# Patient Record
Sex: Female | Born: 1975
Health system: Southern US, Community
[De-identification: ages and names within clinical notes are randomized; demographics above are authoritative.]

## PROBLEM LIST (undated history)

## (undated) DIAGNOSIS — F329 Major depressive disorder, single episode, unspecified: Secondary | ICD-10-CM

## (undated) DIAGNOSIS — F419 Anxiety disorder, unspecified: Secondary | ICD-10-CM

## (undated) DIAGNOSIS — B977 Papillomavirus as the cause of diseases classified elsewhere: Secondary | ICD-10-CM

## (undated) DIAGNOSIS — C801 Malignant (primary) neoplasm, unspecified: Secondary | ICD-10-CM

## (undated) DIAGNOSIS — F32A Depression, unspecified: Secondary | ICD-10-CM

## (undated) DIAGNOSIS — N6320 Unspecified lump in the left breast, unspecified quadrant: Secondary | ICD-10-CM

## (undated) DIAGNOSIS — IMO0002 Reserved for concepts with insufficient information to code with codable children: Secondary | ICD-10-CM

## (undated) DIAGNOSIS — R87619 Unspecified abnormal cytological findings in specimens from cervix uteri: Secondary | ICD-10-CM

## (undated) HISTORY — DX: Malignant (primary) neoplasm, unspecified: C80.1

## (undated) HISTORY — PX: ABDOMINAL HYSTERECTOMY: SHX81

## (undated) HISTORY — PX: TUBAL LIGATION: SHX77

## (undated) HISTORY — DX: Papillomavirus as the cause of diseases classified elsewhere: B97.7

## (undated) HISTORY — DX: Reserved for concepts with insufficient information to code with codable children: IMO0002

## (undated) HISTORY — PX: BREAST ENHANCEMENT SURGERY: SHX7

## (undated) HISTORY — PX: BREAST SURGERY: SHX581

## (undated) HISTORY — PX: AUGMENTATION MAMMAPLASTY: SUR837

## (undated) HISTORY — DX: Anxiety disorder, unspecified: F41.9

## (undated) HISTORY — DX: Unspecified abnormal cytological findings in specimens from cervix uteri: R87.619

## (undated) HISTORY — PX: COLPOSCOPY: SHX161

## (undated) HISTORY — PX: BREAST EXCISIONAL BIOPSY: SUR124

---

## 2000-05-27 ENCOUNTER — Ambulatory Visit (HOSPITAL_COMMUNITY): Admission: RE | Admit: 2000-05-27 | Discharge: 2000-05-27 | Payer: Self-pay | Admitting: Family Medicine

## 2000-05-27 ENCOUNTER — Encounter: Payer: Self-pay | Admitting: Family Medicine

## 2011-04-26 ENCOUNTER — Other Ambulatory Visit (HOSPITAL_COMMUNITY)
Admission: RE | Admit: 2011-04-26 | Discharge: 2011-04-26 | Disposition: A | Discharge status: Home / Self Care | Payer: Self-pay | Source: Ambulatory Visit | Attending: Family Medicine | Admitting: Family Medicine

## 2011-04-26 ENCOUNTER — Other Ambulatory Visit: Payer: Self-pay | Admitting: Nurse Practitioner

## 2011-04-26 ENCOUNTER — Other Ambulatory Visit (HOSPITAL_COMMUNITY)
Admission: RE | Admit: 2011-04-26 | Discharge: 2011-04-26 | Disposition: A | Discharge status: Home / Self Care | Payer: Self-pay | Source: Ambulatory Visit | Attending: Unknown Physician Specialty | Admitting: Unknown Physician Specialty

## 2011-04-26 DIAGNOSIS — R87619 Unspecified abnormal cytological findings in specimens from cervix uteri: Secondary | ICD-10-CM | POA: Insufficient documentation

## 2011-04-26 DIAGNOSIS — Z01419 Encounter for gynecological examination (general) (routine) without abnormal findings: Secondary | ICD-10-CM | POA: Insufficient documentation

## 2011-05-05 ENCOUNTER — Other Ambulatory Visit (HOSPITAL_COMMUNITY): Payer: Self-pay | Admitting: Family Medicine

## 2011-05-05 DIAGNOSIS — N6452 Nipple discharge: Secondary | ICD-10-CM

## 2011-05-18 ENCOUNTER — Encounter (HOSPITAL_COMMUNITY): Payer: Self-pay

## 2011-05-25 ENCOUNTER — Inpatient Hospital Stay (HOSPITAL_COMMUNITY): Admission: RE | Admit: 2011-05-25 | Payer: Self-pay | Source: Ambulatory Visit

## 2012-09-18 ENCOUNTER — Other Ambulatory Visit (HOSPITAL_COMMUNITY)
Admission: RE | Admit: 2012-09-18 | Discharge: 2012-09-18 | Disposition: A | Discharge status: Home / Self Care | Payer: Managed Care, Other (non HMO) | Source: Ambulatory Visit | Attending: Unknown Physician Specialty | Admitting: Unknown Physician Specialty

## 2012-09-18 ENCOUNTER — Other Ambulatory Visit (HOSPITAL_COMMUNITY): Payer: Self-pay | Admitting: Nurse Practitioner

## 2012-09-18 DIAGNOSIS — R87810 Cervical high risk human papillomavirus (HPV) DNA test positive: Secondary | ICD-10-CM | POA: Insufficient documentation

## 2012-09-18 DIAGNOSIS — D069 Carcinoma in situ of cervix, unspecified: Secondary | ICD-10-CM | POA: Insufficient documentation

## 2012-09-18 DIAGNOSIS — N6452 Nipple discharge: Secondary | ICD-10-CM

## 2012-10-03 ENCOUNTER — Encounter (HOSPITAL_COMMUNITY): Payer: Self-pay

## 2013-02-12 ENCOUNTER — Encounter: Payer: Self-pay | Admitting: *Deleted

## 2013-02-13 ENCOUNTER — Encounter: Payer: Managed Care, Other (non HMO) | Admitting: Obstetrics and Gynecology

## 2013-05-27 ENCOUNTER — Encounter: Payer: Self-pay | Admitting: Obstetrics and Gynecology

## 2013-06-03 ENCOUNTER — Encounter (INDEPENDENT_AMBULATORY_CARE_PROVIDER_SITE_OTHER): Payer: Self-pay

## 2013-06-03 ENCOUNTER — Encounter: Payer: Self-pay | Admitting: Obstetrics and Gynecology

## 2013-06-03 ENCOUNTER — Ambulatory Visit (INDEPENDENT_AMBULATORY_CARE_PROVIDER_SITE_OTHER): Payer: Managed Care, Other (non HMO) | Admitting: Obstetrics and Gynecology

## 2013-06-03 VITALS — BP 118/64 | Ht 63.5 in | Wt 113.4 lb

## 2013-06-03 DIAGNOSIS — R87619 Unspecified abnormal cytological findings in specimens from cervix uteri: Secondary | ICD-10-CM | POA: Insufficient documentation

## 2013-06-03 NOTE — Progress Notes (Signed)
Will call [facility] and request records.Patient did not sign release. AHB

## 2013-06-03 NOTE — Progress Notes (Signed)
This chart was transcribed for Dr. Mallory Shirk by Ludger Nutting, ED scribe.   Dominique Taylor is an 38 y.o. female. Pt presents today for follow up on an abnormal pap smear and leep procedure. She reports having 2 prior colposcopies. Her records were reviewed but no prior notes were found. LNMP December 28th  CC: requesting leap procedure for abnormal pap smear.   Pertinent Gynecological History:  No records to confirm history at this time.   Menstrual History: N/A  Patient's last menstrual period was 05/21/2013.    Past Medical History  Diagnosis Date  . Abnormal Pap smear   . Cancer     ovarian cervical ca  . HPV (human papilloma virus) infection     Past Surgical History  Procedure Laterality Date  . Colposcopy    . Tubal ligation    . Breast enhancement surgery      Family History  Problem Relation Age of Onset  . Arthritis Mother   . Cancer Mother   . Depression Father   . Hypertension Father   . Crohn's disease Father     Social History:  reports that she has been smoking Cigarettes.  She has a 23 pack-year smoking history. She has never used smokeless tobacco. She reports that she does not drink alcohol or use illicit drugs.  Allergies: No Known Allergies   (Not in a hospital admission)  ROS  Blood pressure 118/64, height 5' 3.5" (1.613 m), weight 113 lb 6.4 oz (51.438 kg), last menstrual period 05/21/2013. Physical Exam  No results found for this or any previous visit (from the past 24 hour(s)).  No results found.  Assessment/Plan: Reschedule visit to allow time to request past records.   [name] V 06/03/2013, 3:21 PM

## 2013-06-17 ENCOUNTER — Encounter: Payer: Self-pay | Admitting: Obstetrics and Gynecology

## 2013-06-17 ENCOUNTER — Ambulatory Visit (INDEPENDENT_AMBULATORY_CARE_PROVIDER_SITE_OTHER): Payer: Managed Care, Other (non HMO) | Admitting: Obstetrics and Gynecology

## 2013-06-17 ENCOUNTER — Other Ambulatory Visit: Payer: Self-pay | Admitting: Obstetrics and Gynecology

## 2013-06-17 VITALS — BP 100/60 | Ht 63.0 in | Wt 113.2 lb

## 2013-06-17 DIAGNOSIS — Z3202 Encounter for pregnancy test, result negative: Secondary | ICD-10-CM

## 2013-06-17 DIAGNOSIS — N870 Mild cervical dysplasia: Secondary | ICD-10-CM

## 2013-06-17 LAB — POCT URINE PREGNANCY: Preg Test, Ur: NEGATIVE

## 2013-06-17 NOTE — Progress Notes (Deleted)
This chart was transcribed for Dr. Mallory Shirk by Ludger Nutting, ED scribe.   [facility] Visit  Patient name: Dominique Taylor MRN [id number]  Date of birth: Sep 23, 1975  CC & HPI:  Dominique Taylor is a 38 y.o. female presenting today for **  ROS:  ***  Pertinent History Reviewed:  Medical & Surgical Hx:  Reviewed: Significant for *** Medications: Reviewed & Updated - see associated section Social History: Reviewed -  reports that she has been smoking Cigarettes.  She has a 23 pack-year smoking history. She has never used smokeless tobacco.  Objective Findings:  Vitals: BP 100/60  Ht 5\' 3"  (1.6 m)  Wt 113 lb 3.2 oz (51.347 kg)  BMI 20.06 kg/m2  LMP 06/09/2013  Physical Examination: {female adult master:[id number]}   Assessment & Plan:   ***

## 2013-06-17 NOTE — Patient Instructions (Signed)
LEEP POST-PROCEDURE INSTRUCTIONS  1. You may take Ibuprofen, Aleve or Tylenol for pain if needed.  Cramping is normal.  2. You will have black and/or bloody discharge at first.  This will lighten and then turn clear before completely resolving.  This will take 2 to 3 weeks.  3. Put nothing in your vagina until the bleeding or discharge stops (usually 2 or3 days). Avoid sex until you are reexamined in 3 weeks.  4. You need to call if you have redness around the biopsy site, if there is any unusual draining, if the bleeding is heavy, or if you are concerned.  5. Shower or bathe as normal  6. We will call you within one week with results or we will discuss the results at your follow-up appointment if needed.  7. You will need to return for a follow-up Pap smear as directed by your physician.

## 2013-06-17 NOTE — Progress Notes (Signed)
This chart was transcribed for Dr. Mallory Shirk by Ludger Nutting, ED scribe.    CLINIC LEEP PROCEDURE NOTE  Pap smear and colposcopy reviewed.  referrred from health Dept Pap ASCUS + HPV Colpo Biopsy at the health dept that showed high grade abnormalities.  ECC negative  Risks, benefits, alternatives, and limitations of procedure explained to patient, including pain, bleeding, infection, failure to remove abnormal tissue and failure to cure dysplasia, need for repeat procedures, damage to pelvic organs, cervical incompetence.  Role of HPV,cervical dysplasia and need for close followup was empasized. Informed written consent was obtained. All questions were answered. Time out performed.  ??Procedure: The patient was placed in lithotomy position and the bivalved coated speculum was placed in the patient's vagina. A grounding pad placed on the patient. Lugol's solution was applied to the cervix and areas of decreased uptake were noted around the transformation zone.   Local anesthesia was administered via an intracervical block using 10cc of 2% Lidocaine with epinephrine. The suction was turned on and the Large 1X Fisher Cone Biopsy Excisor on 37 Watts of cutting current was used to excise the area of decreased uptake and excise the entire transformation zone. Excellent hemostasis was achieved using Monsel's solution ,which was then applied and the speculum was removed from the vagina. Specimens were sent to pathology.  ?The patient tolerated the procedure well. Post-operative instructions given to patient, including instruction to seek medical attention for persistent bright red bleeding, fever, abdominal/pelvic pain, dysuria, nausea or vomiting. She was also told about the possibility of having copious yellow to black tinged discharge for weeks. She was counseled to avoid anything in the vagina (sex/douching/tampons) for 3 weeks. She has a 4 week post-operative check to assess wound healing, review results  and discuss further management.

## 2013-06-24 ENCOUNTER — Encounter: Payer: Self-pay | Admitting: Obstetrics and Gynecology

## 2013-06-25 ENCOUNTER — Ambulatory Visit (INDEPENDENT_AMBULATORY_CARE_PROVIDER_SITE_OTHER): Payer: Managed Care, Other (non HMO) | Admitting: Obstetrics and Gynecology

## 2013-06-25 ENCOUNTER — Telehealth: Payer: Self-pay | Admitting: Obstetrics and Gynecology

## 2013-06-25 ENCOUNTER — Encounter: Payer: Self-pay | Admitting: Obstetrics and Gynecology

## 2013-06-25 VITALS — BP 100/58 | Ht 63.0 in | Wt 116.0 lb

## 2013-06-25 DIAGNOSIS — N898 Other specified noninflammatory disorders of vagina: Secondary | ICD-10-CM

## 2013-06-25 MED ORDER — METRONIDAZOLE 0.75 % VA GEL: 1.0000 | Freq: Every day | VAGINAL | Status: DC

## 2013-06-25 NOTE — Patient Instructions (Signed)
Cancel the appt for the 23rd, reschedule for 4 wks. Use metrogel 2times weekly.

## 2013-06-25 NOTE — Telephone Encounter (Signed)
C/o lower left sided pain starting yesterday, states has "black charcoal looking vaginal discharge." Pt told to be here at 1:30 today to see Dr. Glo Herring.

## 2013-06-25 NOTE — Progress Notes (Signed)
   [facility] Visit  Patient name: Dominique Taylor MRN [id number]  Date of birth: 1975-12-13  CC & HPI:  Dominique Taylor is a 38 y.o. female presenting today for heavy dischg after LEEP last week. + Malodor,, heavy d/c with the Monsels breaking down.  Path :  cin I clear margins.  ROS:  No fever,   Pertinent History Reviewed:  Medical & Surgical Hx:  Reviewed: Significant for  Medications: Reviewed & Updated - see associated section Social History: Reviewed -  reports that she has been smoking Cigarettes.  She has a 23 pack-year smoking history. She has never used smokeless tobacco.  Objective Findings:  Vitals: BP 100/58  Ht 5\' 3"  (1.6 m)  Wt 116 lb (52.617 kg)  BMI 20.55 kg/m2  LMP 06/09/2013  Physical Examination: Abdomen - soft, nontender, nondistended, no masses or organomegaly Pelvic - normal external genitalia, vulva, vagina, cervix, uterus and adnexa, VULVA: normal appearing vulva with no masses, tenderness or lesions, VAGINA: vaginal discharge - copious, grey and monsels removed from vag vault, CERVIX: erythematous healing s/p leep, path CIN I   Assessment & Plan:   Post-LEEP cervical irritation from monsels Plan: RX Metrogel

## 2013-07-08 ENCOUNTER — Ambulatory Visit: Payer: Managed Care, Other (non HMO) | Admitting: Obstetrics and Gynecology

## 2013-07-23 ENCOUNTER — Encounter: Payer: Self-pay | Admitting: Obstetrics and Gynecology

## 2013-07-23 ENCOUNTER — Ambulatory Visit (INDEPENDENT_AMBULATORY_CARE_PROVIDER_SITE_OTHER): Payer: Managed Care, Other (non HMO) | Admitting: Obstetrics and Gynecology

## 2013-07-23 ENCOUNTER — Encounter (INDEPENDENT_AMBULATORY_CARE_PROVIDER_SITE_OTHER): Payer: Self-pay

## 2013-07-23 VITALS — BP 102/60 | Ht 63.0 in | Wt 115.0 lb

## 2013-07-23 DIAGNOSIS — N870 Mild cervical dysplasia: Secondary | ICD-10-CM

## 2013-07-23 NOTE — Progress Notes (Signed)
Patient ID: Dominique Taylor, female   DOB: 1976/03/04, 38 y.o.   MRN: [id number]    Subjective:  Dominique Taylor is a 38 y.o. female who presents to the clinic 4 weeks status post LEEP in referral from health dept for high grade dysplasia on bx, after LSIL pap..   Path: LSIL with clear margins  Review of Systems Negative except n/a  She has been eating a regular diet without difficulty.   Bowel movements are normal. The patient is not having any pain.she is not yet sexually active  Objective:  BP 102/60  Ht 5\' 3"  (1.6 m)  Wt 115 lb (52.164 kg)  BMI 20.38 kg/m2  LMP 07/07/2013 General:Well developed, well nourished.  No acute distress. Abdomen: Bowel sounds normal, soft, non-tender. Pelvic Exam:    External Genitalia:  Normal.    Vagina: Normal    Bimanual: Normal    Cervix: everted in standard healing s/p leep with increased visibility of EC glandsl    Uterus: Normal    Adnexa: Normal  Incision(s):  Healed leep   Assessment:  Post-Op 4 weeks s/p leep : Path: CIN-I    Doing well postoperatively.   Plan:  1.Wound care discussed  followup paps at health dept in 6 months then annually x yrs. 2. .Continue any current medications. 3. Activity restrictions: none 4. return to work: now. 5. Follow up in 6 month at htealth dept.

## 2013-07-23 NOTE — Patient Instructions (Signed)
Cervical Dysplasia Cervical dysplasia is a condition in which a woman has abnormal changes in the cells of her cervix. The cervix is the opening to the uterus (womb). It is located between the vagina and the uterus. Cervical dysplasia may be the first sign of cervical cancer.  With early detection, treatment, and close follow-up care, nearly all cases of cervical dysplasia can be cured. If left untreated, dysplasia may become more severe.  CAUSES  Cervical dysplasia can be caused by a human papillomavirus (HPV) infection. RISK FACTORS   Having had a sexually transmitted disease, such as chlamydia or a human papillomavirus (HPV) infection.   Becoming sexually active before age 18.   Having had more than 1 sexual partner.   Not using protection during sexual intercourse, especially with new sexual partners.   Having had cancer of the vagina or vulva.   Having a sexual partner whose previous partner had cancer of the cervix or cervical dysplasia.   Having a sexual partner who has or has had cancer of the penis.   Having a weakened immune system (such as from having HIV or an organ transplant).   Being the daughter of a woman who took diethylstilbestrol(DES) during pregnancy.   Having a family history of cervical cancer.   Smoking. SIGNS AND SYMPTOMS  There are usually no symptoms. If there are symptoms, they may include:   Abnormal vaginal discharge.   Bleeding between periods or after intercourse.   Bleeding during menopause.   Pain during sexual intercourse (dyspareunia). DIAGNOSIS  A test called a Pap test may be done.During this test, cells are taken from the cervix and then looked at under a microscope. A test in which tissue is removed from the cervix (biopsy) may also be done if the Pap test is abnormal or if the cervix looks abnormal.  TREATMENT  Treatment varies based on the severity of the cervical dysplasia. Treatment may include:  Cryotherapy.  During cryotherapy, the abnormal cells are frozen with a steel-tip instrument.   A procedure to remove abnormal tissue from the cervix.  Surgery to remove abnormal tissue. This is usually done in serious cases of cervical dysplasia. Surgical options include:  A cone biopsy. This is a procedure in which the cervical canal and a portion of the center of the cervix are removed.   Hysterectomy. This is a surgery in which the uterus and cervix are removed. HOME CARE INSTRUCTIONS   Only take over-the-counter or prescription medicines for pain or discomfort as directed by your health care provider.   Do not use tampons, have sexual intercourse, or douche until your health care provider says it is OK.  Keep follow-up appointments as directed by your health care provider. Women who have been treated for cervical dysplasia should have regular pelvic exams and Pap tests. During the first year following treatment of cervical dysplasia, Pap tests should be done every 3 4 months. In the second year, they should be done every 6 months or as recommended by your health care provider.  To prevent the condition from developing again, practice safe sex. SEEK MEDICAL CARE IF:  You develop genital warts.  SEEK IMMEDIATE MEDICAL CARE IF:   Your menstrual period is heavier than normal.   You develop bright red bleeding, especially if you have blood clots.   You have a fever.   You have increasing cramps or pain not relieved with medicine.   You are lightheaded, unusually weak, or have fainting spells.   You have   abnormal vaginal discharge.   You have abdominal pain. Document Released: 05/02/2005 Document Revised: 01/02/2013 Document Reviewed: 12/26/2012 [facility] Patient Information 2014 [place].

## 2013-12-10 ENCOUNTER — Telehealth: Payer: Self-pay | Admitting: *Deleted

## 2013-12-10 NOTE — Telephone Encounter (Signed)
Message copied by Doyne Keel on Tue Dec 10, 2013  9:09 AM ------      Message from: Dominique Taylor      Created: Sat Dec 07, 2013  8:32 PM       Patient needs followup pap in September, either at our office, or health Dept.      plEASE CALL PT TO ARRANGE ------

## 2013-12-23 NOTE — Telephone Encounter (Signed)
Unable to reach pt by phone, recall in Epic for pt to f/u in September for repeat pap per Dr. Glo Herring.

## 2014-06-13 ENCOUNTER — Ambulatory Visit (INDEPENDENT_AMBULATORY_CARE_PROVIDER_SITE_OTHER): Payer: Managed Care, Other (non HMO) | Admitting: Nurse Practitioner

## 2014-06-13 ENCOUNTER — Encounter: Payer: Self-pay | Admitting: Nurse Practitioner

## 2014-06-13 VITALS — BP 100/66 | HR 89 | Temp 98.0°F | Ht 63.5 in | Wt 119.0 lb

## 2014-06-13 DIAGNOSIS — J069 Acute upper respiratory infection, unspecified: Secondary | ICD-10-CM | POA: Insufficient documentation

## 2014-06-13 DIAGNOSIS — Z720 Tobacco use: Secondary | ICD-10-CM

## 2014-06-13 DIAGNOSIS — R590 Localized enlarged lymph nodes: Secondary | ICD-10-CM | POA: Insufficient documentation

## 2014-06-13 DIAGNOSIS — F17200 Nicotine dependence, unspecified, uncomplicated: Secondary | ICD-10-CM

## 2014-06-13 MED ORDER — GUAIFENESIN ER 600 MG PO TB12: 600.0000 mg | ORAL_TABLET | Freq: Two times a day (BID) | ORAL | Status: DC

## 2014-06-13 MED ORDER — HYDROCODONE-HOMATROPINE 5-1.5 MG/5ML PO SYRP: 5.0000 mL | ORAL_SOLUTION | Freq: Every evening | ORAL | Status: DC | PRN

## 2014-06-13 NOTE — Progress Notes (Signed)
Subjective:     Dominique Taylor is a 39 y.o. female and is here to establish care.The patient reports problems - cough, nasal congestion, sore throat for 4-5 days. She denies CP, SOB, diarrhea, nausea, ear pain. She is not taking anything for symptoms. Complicating factors: smoker. She also c/o swollen lymph node R side of neck times 1 year. NT. Gets bigger when she is sick. Hx CIN I. Fam Hx: mother RA, father UC.   History   Social History  . Marital Status: Married    Spouse Name: N/A    Number of Children: 2  . Years of Education: N/A   Occupational History  . Not on file.   Social History Main Topics  . Smoking status: Current Every Day Smoker -- 1.00 packs/day for 23 years    Types: Cigarettes  . Smokeless tobacco: Never Used  . Alcohol Use: No  . Drug Use: No  . Sexual Activity: Yes   Other Topics Concern  . Not on file   Social History Narrative   [name] lives w/her husband & 2 grown children. She works Medical laboratory scientific officer.   Health Maintenance  Topic Date Due  . INFLUENZA VACCINE  01/13/2016 (Originally 12/14/2013)  . PAP SMEAR  09/19/2015  . TETANUS/TDAP  06/13/2018    The following portions of the patient's history were reviewed and updated as appropriate: allergies, current medications, past family history, past medical history, past social history, past surgical history and problem list.  Review of Systems Constitutional: positive for chills Respiratory: positive for sputum, negative for pleurisy/chest pain and wheezing Cardiovascular: negative for palpitations Genitourinary:negative for abnormal menstrual periods and states cycles seem to be getting shorter   Objective:    BP 100/66 mmHg  Pulse 89  Temp(Src) 98 F (36.7 C) (Oral)  Ht 5' 3.5" (1.613 m)  Wt 119 lb (53.978 kg)  BMI 20.75 kg/m2  SpO2 96%  LMP 05/27/2014 General appearance: alert, cooperative, appears stated age and no distress Head: Normocephalic, without obvious abnormality, atraumatic Eyes:  negative findings: lids and lashes normal and conjunctivae and sclerae normal Ears: normal TM's and external ear canals both ears Throat: lips, mucosa, and tongue normal; teeth and gums normal  Nasal quality to voice, sniffles during exam Neck: no carotid bruit, supple, symmetrical, trachea midline, thyroid not enlarged, symmetric, no tenderness/mass/nodules and palpable anterior cervical nodes, bilat. Has prominent R anterior cervical nod, ovoid, about 1/5 cm, moveable, NT. Heart: RRR, no murmur Lungs: clear to auscultation bilaterally and cough periodically during exam    Assessment:Plan  1. Upper respiratory infection with cough and congestion Symptom management - guaiFENesin (MUCINEX) 600 MG 12 hr tablet; Take 1 tablet (600 mg total) by mouth 2 (two) times daily.  Dispense: 15 tablet; Refill: 0 - HYDROcodone-homatropine (HYCODAN) 5-1.5 MG/5ML syrup; Take 5 mLs by mouth at bedtime as needed for cough.  Dispense: 120 mL; Refill: 0  2. Smoker  3. Lymphadenopathy of right cervical region F/u 3 weeks-baseline labs, possibly neck US & CXR

## 2014-06-13 NOTE — Patient Instructions (Signed)
You have a virus causing your symptoms. The average duration of cold symptoms is 14 days.  For nasal congestion: Start daily sinus rinses (neilmed Sinus Rinse). Use 30 mg to 60 mg pseudoephedrine 2 to 3 times daily. Vicks vapor rub under nose to help breathe. For cough: Use cough syrup at night. Use guaifenesen 600 mg twice daily. Sip fluids hourly so medicine will work best.  Tylenol or ibuprophen for headache. Sip fluids every hour. Rest. I will see you in 3 weeks to evaluate lymph node or sooner if you develop fever or chest pain. Very nice to meet you!  Upper Respiratory Infection, Adult An upper respiratory infection (URI) is also sometimes known as the common cold. The upper respiratory tract includes the nose, sinuses, throat, trachea, and bronchi. Bronchi are the airways leading to the lungs. Most people improve within 1 week, but symptoms can last up to 2 weeks. A residual cough may last even longer.  CAUSES Many different viruses can infect the tissues lining the upper respiratory tract. The tissues become irritated and inflamed and often become very moist. Mucus production is also common. A cold is contagious. You can easily spread the virus to others by oral contact. This includes kissing, sharing a glass, coughing, or sneezing. Touching your mouth or nose and then touching a surface, which is then touched by another person, can also spread the virus. SYMPTOMS  Symptoms typically develop 1 to 3 days after you come in contact with a cold virus. Symptoms vary from person to person. They may include:  Runny nose.  Sneezing.  Nasal congestion.  Sinus irritation.  Sore throat.  Loss of voice (laryngitis).  Cough.  Fatigue.  Muscle aches.  Loss of appetite.  Headache.  Low-grade fever. DIAGNOSIS  You might diagnose your own cold based on familiar symptoms, since most people get a cold 2 to 3 times a year. Your caregiver can confirm this based on your exam. Most  importantly, your caregiver can check that your symptoms are not due to another disease such as strep throat, sinusitis, pneumonia, asthma, or epiglottitis. Blood tests, throat tests, and X-rays are not necessary to diagnose a common cold, but they may sometimes be helpful in excluding other more serious diseases. Your caregiver will decide if any further tests are required. RISKS AND COMPLICATIONS  You may be at risk for a more severe case of the common cold if you smoke cigarettes, have chronic heart disease (such as heart failure) or lung disease (such as asthma), or if you have a weakened immune system. The very young and very old are also at risk for more serious infections. Bacterial sinusitis, middle ear infections, and bacterial pneumonia can complicate the common cold. The common cold can worsen asthma and chronic obstructive pulmonary disease (COPD). Sometimes, these complications can require emergency medical care and may be life-threatening. PREVENTION  The best way to protect against getting a cold is to practice good hygiene. Avoid oral or hand contact with people with cold symptoms. Wash your hands often if contact occurs. There is no clear evidence that vitamin C, vitamin E, echinacea, or exercise reduces the chance of developing a cold. However, it is always recommended to get plenty of rest and practice good nutrition. TREATMENT  Treatment is directed at relieving symptoms. There is no cure. Antibiotics are not effective, because the infection is caused by a virus, not by bacteria. Treatment may include:  Increased fluid intake. Sports drinks offer valuable electrolytes, sugars, and fluids.  Breathing heated mist or steam (vaporizer or shower).  Eating chicken soup or other clear broths, and maintaining good nutrition.  Getting plenty of rest.  Using gargles or lozenges for comfort.  Controlling fevers with ibuprofen or acetaminophen as directed by your caregiver.  Increasing  usage of your inhaler if you have asthma. Zinc gel and zinc lozenges, taken in the first 24 hours of the common cold, can shorten the duration and lessen the severity of symptoms. Pain medicines may help with fever, muscle aches, and throat pain. A variety of non-prescription medicines are available to treat congestion and runny nose. Your caregiver can make recommendations and may suggest nasal or lung inhalers for other symptoms.  HOME CARE INSTRUCTIONS   Only take over-the-counter or prescription medicines for pain, discomfort, or fever as directed by your caregiver.  Use a warm mist humidifier or inhale steam from a shower to increase air moisture. This may keep secretions moist and make it easier to breathe.  Drink enough water and fluids to keep your urine clear or pale yellow.  Rest as needed.  Return to work when your temperature has returned to normal or as your caregiver advises. You may need to stay home longer to avoid infecting others. You can also use a face mask and careful hand washing to prevent spread of the virus. SEEK MEDICAL CARE IF:   After the first few days, you feel you are getting worse rather than better.  You need your caregiver's advice about medicines to control symptoms.  You develop chills, worsening shortness of breath, or brown or red sputum. These may be signs of pneumonia.  You develop yellow or brown nasal discharge or pain in the face, especially when you bend forward. These may be signs of sinusitis.  You develop a fever, swollen neck glands, pain with swallowing, or white areas in the back of your throat. These may be signs of strep throat. SEEK IMMEDIATE MEDICAL CARE IF:   You have a fever.  You develop severe or persistent headache, ear pain, sinus pain, or chest pain.  You develop wheezing, a prolonged cough, cough up blood, or have a change in your usual mucus (if you have chronic lung disease).  You develop sore muscles or a stiff  neck. Document Released: 10/26/2000 Document Revised: 07/25/2011 Document Reviewed: 09/03/2010 [facility] Patient Information 2014 [place], [place].

## 2014-06-13 NOTE — Progress Notes (Signed)
Pre visit review using our clinic review tool, if applicable. No additional management support is needed unless otherwise documented below in the visit note. 

## 2014-06-16 ENCOUNTER — Telehealth: Payer: Self-pay | Admitting: Nurse Practitioner

## 2014-06-16 NOTE — Telephone Encounter (Signed)
emmi mailed  °

## 2014-06-29 ENCOUNTER — Emergency Department (HOSPITAL_COMMUNITY): Payer: Managed Care, Other (non HMO)

## 2014-06-29 ENCOUNTER — Encounter (HOSPITAL_COMMUNITY): Payer: Self-pay | Admitting: Emergency Medicine

## 2014-06-29 ENCOUNTER — Emergency Department (HOSPITAL_COMMUNITY)
Admission: EM | Admit: 2014-06-29 | Discharge: 2014-06-29 | Disposition: A | Discharge status: Home / Self Care | Payer: Managed Care, Other (non HMO) | Attending: Emergency Medicine | Admitting: Emergency Medicine

## 2014-06-29 DIAGNOSIS — R319 Hematuria, unspecified: Secondary | ICD-10-CM | POA: Insufficient documentation

## 2014-06-29 DIAGNOSIS — Z3202 Encounter for pregnancy test, result negative: Secondary | ICD-10-CM | POA: Diagnosis not present

## 2014-06-29 DIAGNOSIS — Z8619 Personal history of other infectious and parasitic diseases: Secondary | ICD-10-CM | POA: Diagnosis not present

## 2014-06-29 DIAGNOSIS — Z720 Tobacco use: Secondary | ICD-10-CM | POA: Diagnosis not present

## 2014-06-29 DIAGNOSIS — Z8543 Personal history of malignant neoplasm of ovary: Secondary | ICD-10-CM | POA: Insufficient documentation

## 2014-06-29 DIAGNOSIS — Z79899 Other long term (current) drug therapy: Secondary | ICD-10-CM | POA: Insufficient documentation

## 2014-06-29 DIAGNOSIS — F17200 Nicotine dependence, unspecified, uncomplicated: Secondary | ICD-10-CM

## 2014-06-29 DIAGNOSIS — J069 Acute upper respiratory infection, unspecified: Secondary | ICD-10-CM | POA: Insufficient documentation

## 2014-06-29 DIAGNOSIS — R05 Cough: Secondary | ICD-10-CM | POA: Diagnosis present

## 2014-06-29 LAB — URINALYSIS, ROUTINE W REFLEX MICROSCOPIC
GLUCOSE, UA: NEGATIVE mg/dL
Nitrite: NEGATIVE
PH: 6 (ref 5.0–8.0)
Protein, ur: NEGATIVE mg/dL
Specific Gravity, Urine: 1.021 (ref 1.005–1.030)
Urobilinogen, UA: 1 mg/dL (ref 0.0–1.0)

## 2014-06-29 LAB — COMPREHENSIVE METABOLIC PANEL
ALT: 21 U/L (ref 0–35)
AST: 21 U/L (ref 0–37)
Albumin: 4 g/dL (ref 3.5–5.2)
Alkaline Phosphatase: 71 U/L (ref 39–117)
Anion gap: 9 (ref 5–15)
BUN: 6 mg/dL (ref 6–23)
CALCIUM: 9.1 mg/dL (ref 8.4–10.5)
CO2: 25 mmol/L (ref 19–32)
CREATININE: 0.58 mg/dL (ref 0.50–1.10)
Chloride: 102 mmol/L (ref 96–112)
GFR calc Af Amer: >90 mL/min (ref >90)
Glucose, Bld: 113 mg/dL — ABNORMAL HIGH (ref 70–99)
Potassium: 3.7 mmol/L (ref 3.5–5.1)
Sodium: 136 mmol/L (ref 135–145)
Total Bilirubin: 0.7 mg/dL (ref 0.3–1.2)
Total Protein: 7.2 g/dL (ref 6.0–8.3)

## 2014-06-29 LAB — CBC WITH DIFFERENTIAL/PLATELET
Basophils Absolute: 0.1 10*3/uL (ref 0.0–0.1)
Basophils Relative: 1 % (ref 0–1)
EOS ABS: 0.2 10*3/uL (ref 0.0–0.7)
EOS PCT: 2 % (ref 0–5)
HCT: 40.4 % (ref 36.0–46.0)
Hemoglobin: 14.1 g/dL (ref 12.0–15.0)
LYMPHS ABS: 2.2 10*3/uL (ref 0.7–4.0)
Lymphocytes Relative: 25 % (ref 12–46)
MCH: 34.4 pg — ABNORMAL HIGH (ref 26.0–34.0)
MCHC: 34.9 g/dL (ref 30.0–36.0)
MCV: 98.5 fL (ref 78.0–100.0)
MONO ABS: 0.7 10*3/uL (ref 0.1–1.0)
Monocytes Relative: 8 % (ref 3–12)
NEUTROS PCT: 64 % (ref 43–77)
Neutro Abs: 5.7 10*3/uL (ref 1.7–7.7)
Platelets: 252 10*3/uL (ref 150–400)
RBC: 4.1 MIL/uL (ref 3.87–5.11)
RDW: 12.4 % (ref 11.5–15.5)
WBC: 8.8 10*3/uL (ref 4.0–10.5)

## 2014-06-29 LAB — URINE MICROSCOPIC-ADD ON

## 2014-06-29 LAB — PREGNANCY, URINE: Preg Test, Ur: NEGATIVE

## 2014-06-29 MED ORDER — BENZONATATE 100 MG PO CAPS: 100.0000 mg | ORAL_CAPSULE | Freq: Two times a day (BID) | ORAL | Status: DC | PRN

## 2014-06-29 MED ORDER — DEXAMETHASONE SODIUM PHOSPHATE 10 MG/ML IJ SOLN
10.0000 mg | Freq: Once | INTRAMUSCULAR | Status: AC
  Administered 2014-06-29: 10 mg via INTRAMUSCULAR
  Filled 2014-06-29: qty 1

## 2014-06-29 MED ORDER — AZITHROMYCIN 250 MG PO TABS: 250.0000 mg | ORAL_TABLET | Freq: Every day | ORAL | Status: DC

## 2014-06-29 NOTE — ED Notes (Signed)
Pt a/o x 4 on d/c with steady gait. 

## 2014-06-29 NOTE — ED Notes (Signed)
Cough for 3 weeks with chills and  Sweating.  She vomited this am  lmp jan  2oth.  Current smoker.  Productive cough

## 2014-06-29 NOTE — ED Notes (Signed)
Pt to xray for chest

## 2014-06-29 NOTE — ED Provider Notes (Signed)
CSN: [id number]     Arrival date & time 06/29/14  1818 History   First MD Initiated Contact with Patient 06/29/14 1911     Chief Complaint  Patient presents with  . Cough  . Fever  . Chills  . Emesis     (Consider location/radiation/quality/duration/timing/severity/associated sxs/prior Treatment) Patient is a 39 y.o. female presenting with URI. The history is provided by the patient.  URI Presenting symptoms: congestion and cough   Presenting symptoms: no fever, no rhinorrhea and no sore throat   Severity:  Moderate Onset quality:  Gradual Duration:  4 weeks Timing:  Constant Progression:  Worsening Chronicity:  New Relieved by:  None tried Worsened by:  Nothing tried Ineffective treatments:  None tried Associated symptoms: no headaches, no neck pain, no swollen glands and no wheezing   Risk factors: no sick contacts   Risk factors comment:  Heavy tobacco use   Past Medical History  Diagnosis Date  . Abnormal Pap smear   . Cancer     ovarian cervical ca  . HPV (human papilloma virus) infection    Past Surgical History  Procedure Laterality Date  . Colposcopy    . Tubal ligation    . Breast enhancement surgery     Family History  Problem Relation Age of Onset  . Arthritis Mother     RA  . Cancer Mother     Ovarian  . Depression Father   . Hypertension Father   . Diabetes Father   . Ulcerative colitis Father   . Hypertension Brother    History  Substance Use Topics  . Smoking status: Current Every Day Smoker -- 1.00 packs/day for 23 years    Types: Cigarettes  . Smokeless tobacco: Never Used  . Alcohol Use: No   OB History    No data available     Review of Systems  Constitutional: Negative for fever, chills and diaphoresis.  HENT: Positive for congestion. Negative for rhinorrhea and sore throat.   Eyes: Negative for visual disturbance.  Respiratory: Positive for cough. Negative for shortness of breath and wheezing.   Cardiovascular: Negative for  chest pain and leg swelling.  Gastrointestinal: Positive for vomiting (post-tussive). Negative for nausea and diarrhea.  Genitourinary: Negative for dysuria and flank pain.  Musculoskeletal: Negative for neck pain.  Skin: Negative for rash.  Neurological: Negative for dizziness, syncope, weakness, light-headedness and headaches.      Allergies  Review of patient's allergies indicates no known allergies.  Home Medications   Prior to Admission medications   Medication Sig Start Date End Date Taking? Authorizing Provider  Prenatal Vit-Fe Fumarate-FA (PRENATAL MULTIVITAMIN) TABS tablet Take 1 tablet by mouth daily at 12 noon.   Yes Historical Provider, MD  guaiFENesin (MUCINEX) 600 MG 12 hr tablet Take 1 tablet (600 mg total) by mouth 2 (two) times daily. Patient not taking: Reported on 06/29/2014 06/13/14   Irene Pap, NP  HYDROcodone-homatropine (HYCODAN) 5-1.5 MG/5ML syrup Take 5 mLs by mouth at bedtime as needed for cough. Patient not taking: Reported on 06/29/2014 06/13/14   Irene Pap, NP   BP 94/71 mmHg  Pulse 96  Temp(Src) 97.9 F (36.6 C) (Oral)  Resp 20  Ht 5\' 3"  (1.6 m)  Wt 119 lb (53.978 kg)  BMI 21.09 kg/m2  SpO2 99%  LMP 06/04/2014 Physical Exam  Constitutional: She is oriented to person, place, and time. She appears well-developed and well-nourished. No distress.  HENT:  Head: Normocephalic and atraumatic.  Eyes:  Conjunctivae are normal. Pupils are equal, round, and reactive to light.  Neck: Neck supple.  Cardiovascular: Normal rate and normal heart sounds.  Exam reveals no friction rub.   No murmur heard. Pulmonary/Chest: Effort normal. No respiratory distress. She has decreased breath sounds. She has no wheezes. She has no rales.  Abdominal: Soft. She exhibits no distension. There is no tenderness.  Musculoskeletal: She exhibits no tenderness.  Neurological: She is alert and oriented to person, place, and time.  Skin: No rash noted.  Nursing note and  vitals reviewed.   ED Course  Procedures (including critical care time) Labs Review Labs Reviewed  CBC WITH DIFFERENTIAL/PLATELET - Abnormal; Notable for the following:    MCH 34.4 (*)    All other components within normal limits  COMPREHENSIVE METABOLIC PANEL - Abnormal; Notable for the following:    Glucose, Bld 113 (*)    All other components within normal limits  URINALYSIS, ROUTINE W REFLEX MICROSCOPIC - Abnormal; Notable for the following:    Color, Urine AMBER (*)    APPearance CLOUDY (*)    Hgb urine dipstick MODERATE (*)    Bilirubin Urine SMALL (*)    Ketones, ur >80 (*)    Leukocytes, UA TRACE (*)    All other components within normal limits  URINE MICROSCOPIC-ADD ON - Abnormal; Notable for the following:    Squamous Epithelial / LPF MANY (*)    Bacteria, UA FEW (*)    All other components within normal limits  PREGNANCY, URINE    Imaging Review Dg Chest 2 View  06/29/2014   CLINICAL DATA:  Four-week history of cough  EXAM: CHEST  2 VIEW  COMPARISON:  None.  FINDINGS: Lungs are clear. Heart size and pulmonary vascularity are normal. No adenopathy. There is lower thoracic levoscoliosis.  IMPRESSION: No edema or consolidation.   Electronically Signed   By: Lowella Grip III M.D.   On: 06/29/2014 19:06     EKG Interpretation None      MDM   Final diagnoses:  Upper respiratory infection with cough and congestion  Smoker  Hematuria    39 yo F with PMHx of heavy tobacco abuse who p/w a 3-4 week h/o nagging, productive cough with mild post-tussive nausea and one episode of NBNB emesis. See HPI above. On arrival, T 9F, HR 89, RR 17, BP 100/66, satting 96% on RA. Exam as above, pt overall well-appearing and in NAD.  Pt's presentation is most c/w likely viral URI or bronchitis, with possible component of COPD/obstructive lung disease given known heavy h/o tobacco use. Will send for CXR to eval underlying PNA. No h/o CHF. Pt has no leg swelling, tachypnea,  tachycardia, or s/s DVT or PE and is PERC negative - do not suspect acute PE. Labs sent in triage unremarkable - with normal CBC without leukocytosis and unremarkable CMP. UA with 11-20 RBCs and 3-6 WBCs. Possibly contaminant, but pt states she has had hematuria in the past - given her smoking history, discussed need for PCP f/u for this. She is in agreement. No dysuria, frequency, or urinary s/s and no history to suggest stone. Will f/u CXR and re-assess.  CXR clear. Will treat as viral bronchitis with component of RAD. Will give decadron, Z-Pack, and tessalon pearls. Pt has PCP f/u in several days. Return precautions discussed.  Clinical Impression: 1. Upper respiratory infection with cough and congestion   2. Smoker   3. Hematuria     Disposition: Discharge  Condition: Good  I have discussed the results, Dx and Tx plan with the pt(& family if present). He/she/they expressed understanding and agree(s) with the plan. Discharge instructions discussed at great length. Strict return precautions discussed and pt &/or family have verbalized understanding of the instructions. No further questions at time of discharge.    Discharge Medication List as of 06/29/2014  8:27 PM    START taking these medications   Details  azithromycin (ZITHROMAX) 250 MG tablet Take 1 tablet (250 mg total) by mouth daily. Take first 2 tablets together, then 1 every day until finished., Starting 06/29/2014, Until Discontinued, Print    benzonatate (TESSALON) 100 MG capsule Take 1 capsule (100 mg total) by mouth 2 (two) times daily as needed for cough., Starting 06/29/2014, Until Discontinued, Print        Follow Up: Irene Pap, NP [id number] [place] [address] [phone]   As scheduled  [place] [phone]  Call to discuss the blood in your urine   Pt seen in conjunction with Dr. Marlynn Perking,  MD 06/30/14 0139  Dot Lanes, MD 07/01/14 1256

## 2014-06-29 NOTE — ED Notes (Signed)
Pt c/o body aches, fever, chills and vomiting ongoing x 4 weeks. Pt seen PMD at beginning of symptoms and has follow up appointment Tuesday.

## 2014-07-01 ENCOUNTER — Ambulatory Visit (INDEPENDENT_AMBULATORY_CARE_PROVIDER_SITE_OTHER): Payer: Managed Care, Other (non HMO) | Admitting: Nurse Practitioner

## 2014-07-01 ENCOUNTER — Encounter: Payer: Self-pay | Admitting: Nurse Practitioner

## 2014-07-01 VITALS — BP 94/60 | HR 77 | Temp 98.1°F | Ht 63.5 in | Wt 114.0 lb

## 2014-07-01 DIAGNOSIS — Z0000 Encounter for general adult medical examination without abnormal findings: Secondary | ICD-10-CM | POA: Insufficient documentation

## 2014-07-01 DIAGNOSIS — J189 Pneumonia, unspecified organism: Secondary | ICD-10-CM

## 2014-07-01 DIAGNOSIS — R319 Hematuria, unspecified: Secondary | ICD-10-CM

## 2014-07-01 DIAGNOSIS — R590 Localized enlarged lymph nodes: Secondary | ICD-10-CM

## 2014-07-01 DIAGNOSIS — J069 Acute upper respiratory infection, unspecified: Secondary | ICD-10-CM

## 2014-07-01 DIAGNOSIS — F17200 Nicotine dependence, unspecified, uncomplicated: Secondary | ICD-10-CM

## 2014-07-01 DIAGNOSIS — R599 Enlarged lymph nodes, unspecified: Secondary | ICD-10-CM

## 2014-07-01 DIAGNOSIS — Z720 Tobacco use: Secondary | ICD-10-CM

## 2014-07-01 LAB — VITAMIN D 25 HYDROXY (VIT D DEFICIENCY, FRACTURES): VITD: 20.86 ng/mL — ABNORMAL LOW (ref 30.00–100.00)

## 2014-07-01 LAB — LIPID PANEL
Cholesterol: 143 mg/dL (ref 0–200)
HDL: 41.8 mg/dL (ref >39.00)
LDL Cholesterol: 76 mg/dL (ref 0–99)
NonHDL: 101.2
Total CHOL/HDL Ratio: 3
Triglycerides: 126 mg/dL (ref 0.0–149.0)
VLDL: 25.2 mg/dL (ref 0.0–40.0)

## 2014-07-01 LAB — URINALYSIS, ROUTINE W REFLEX MICROSCOPIC
Hgb urine dipstick: NEGATIVE
LEUKOCYTES UA: NEGATIVE
NITRITE: NEGATIVE
PH: 6 (ref 5.0–8.0)
Specific Gravity, Urine: >=1.03 — AB (ref 1.000–1.030)
Total Protein, Urine: NEGATIVE
Urine Glucose: NEGATIVE
Urobilinogen, UA: 1 (ref 0.0–1.0)

## 2014-07-01 LAB — T4, FREE: Free T4: 1.21 ng/dL (ref 0.60–1.60)

## 2014-07-01 LAB — VITAMIN B12: Vitamin B-12: 504 pg/mL (ref 211–911)

## 2014-07-01 LAB — HEMOGLOBIN A1C: HEMOGLOBIN A1C: 5.4 % (ref 4.6–6.5)

## 2014-07-01 LAB — TSH: TSH: 1.41 u[IU]/mL (ref 0.35–4.50)

## 2014-07-01 MED ORDER — HYDROCODONE-HOMATROPINE 5-1.5 MG/5ML PO SYRP: 5.0000 mL | ORAL_SOLUTION | Freq: Every evening | ORAL | Status: DC | PRN

## 2014-07-01 MED ORDER — PREDNISONE 10 MG PO TABS: ORAL_TABLET | ORAL | Status: DC

## 2014-07-01 MED ORDER — ALBUTEROL SULFATE HFA 108 (90 BASE) MCG/ACT IN AERS: 2.0000 | INHALATION_SPRAY | Freq: Four times a day (QID) | RESPIRATORY_TRACT | Status: DC | PRN

## 2014-07-01 MED ORDER — BENZONATATE 200 MG PO CAPS: 200.0000 mg | ORAL_CAPSULE | Freq: Three times a day (TID) | ORAL | Status: DC | PRN

## 2014-07-01 NOTE — Progress Notes (Signed)
Pre visit review using our clinic review tool, if applicable. No additional management support is needed unless otherwise documented below in the visit note. 

## 2014-07-01 NOTE — Progress Notes (Signed)
Subjective:     Dominique Taylor is a 39 y.o. female presents for f/u cough, swollen lymph node, & UC visit for cough-hematuria incidentally discovered. She has been coughing for 2 1/2 weeks. I saw her in ofc a few days after symptoms started-she also had sore throat & nasal congestion. I treated w/mucinex & cough syrup. She went to UC 2 days ago as cough was not better & was associated w/green sputum. Reviewed note: CXR was clear, but she was started on azithromycin. Cough is persistent w/ "coughing fits" that cause vomiting & SOB. Cough is keeping her awake. She denies wheezing, fever. She was found to have hematuria without infection at [facility]. She denies dysuria, frequency, hesitancy, abd pain, flank pain. Of concern, she is a smoker. Also of concern, she has had hard swollen R anterior cervical lymph node for about 1 year. She has not noticed change in size. She is a smoker, there is concern for head & neck cancer. Node has not changed since I examined her 3 weeks ago. i will screen for thyroid disease also. I have never seen her for CPE, she is fasting today, so I will check lipids & screen for vitamin deficiencies associated w/smokers.  The following portions of the patient's history were reviewed and updated as appropriate: allergies, current medications, past medical history, past social history, past surgical history and problem list.  Review of Systems Constitutional: negative for chills and weight loss Ears, nose, mouth, throat, and face: negative Respiratory: negative for pleurisy/chest pain and wheezing Cardiovascular: negative for palpitations Gastrointestinal: negative for abdominal pain, change in bowel habits, constipation and diarrhea Genitourinary:negative for abnormal menstrual periods Behavioral/Psych: positive for anxiety and pt states she smokes to "calm nerves" Endocrine: negative for temperature intolerance    Objective:    BP 94/60 mmHg  Pulse 77  Temp(Src) 98.1 F  (36.7 C) (Oral)  Ht 5' 3.5" (1.613 m)  Wt 114 lb (51.71 kg)  BMI 19.87 kg/m2  SpO2 96%  LMP 06/04/2014 BP 94/60 mmHg  Pulse 77  Temp(Src) 98.1 F (36.7 C) (Oral)  Ht 5' 3.5" (1.613 m)  Wt 114 lb (51.71 kg)  BMI 19.87 kg/m2  SpO2 96%  LMP 06/04/2014 General appearance: alert, cooperative, appears stated age and no distress Head: Normocephalic, without obvious abnormality, atraumatic Eyes: negative findings: lids and lashes normal and conjunctivae and sclerae normal Ears: normal TM's and external ear canals both ears Throat: lips, mucosa, and tongue normal; teeth and gums normal Neck: supple, symmetrical, trachea midline, thyroid not enlarged, symmetric, no tenderness/mass/nodules and rubbery fixed bilateral anterior cervical LAD, R larger than L.NT. Lungs: clear to auscultation bilaterally Heart: regular rate and rhythm, S1, S2 normal, no murmur, click, rub or gallop    Assessment:Plan  1. Pneumonitis Likely post-viral - benzonatate (TESSALON) 200 MG capsule; Take 1 capsule (200 mg total) by mouth 3 (three) times daily as needed for cough.  Dispense: 40 capsule; Refill: 0 - albuterol (PROVENTIL HFA;VENTOLIN HFA) 108 (90 BASE) MCG/ACT inhaler; Inhale 2 puffs into the lungs every 6 (six) hours as needed for wheezing.  Dispense: 1 Inhaler; Refill: 1 - predniSONE (DELTASONE) 10 MG tablet; Take then 3T po qam X 3d, then 2T po qd X 3d, then 1T po qam X 3d.  Dispense: 18 tablet; Refill: 0  2. Upper respiratory infection with cough and congestion - HYDROcodone-homatropine (HYCODAN) 5-1.5 MG/5ML syrup; Take 5 mLs by mouth at bedtime as needed for cough.  Dispense: 120 mL; Refill: 0  3. Hematuria  DD: UTI, kidney stone, bladder ca Risk factor: smoker - Urinalysis, Routine w reflex microscopic - Urine culture  4. Lymphadenopathy, anterior cervical DD: reactive, ca RF: smoker - T4, free - TSH - Thyroid peroxidase antibody - CT Soft Tissue Neck W Contrast; Future  5. Preventative  health care - Lipid panel - Vitamin B12 - Vit D  25 hydroxy (rtn osteoporosis monitoring) - Hemoglobin A1c  6. Smoker - Vitamin B12 - Vit D  25 hydroxy (rtn osteoporosis monitoring) - Urinalysis, Routine w reflex microscopic - CT Soft Tissue Neck W Contrast; Future    F/u 3 weeks-cough Spent 25 Minutes with patient, 50% or more was spent in counseling & coordination of care.

## 2014-07-01 NOTE — Patient Instructions (Signed)
For cough: start prednisone-take as directed. Use inhaler when cannot stop coughing, up to every 6 hours. Take 200 mg benzonatate capsule up to 3 times daily. Use cough syrup at night. Sip fluids every hour to stay hydrated. Use sinus rinse to clear nasal passages of dried mucous.  For lymph node: get CT scan of neck  My office will call with lab & imaging results.  See me in 3 weeks to evaluate cough.

## 2014-07-02 LAB — THYROID PEROXIDASE ANTIBODY: Thyroperoxidase Ab SerPl-aCnc: <1 IU/mL (ref <9)

## 2014-07-02 LAB — URINE CULTURE
Colony Count: NO GROWTH
Organism ID, Bacteria: NO GROWTH

## 2014-07-03 ENCOUNTER — Ambulatory Visit: Payer: Managed Care, Other (non HMO) | Admitting: Nurse Practitioner

## 2014-07-03 ENCOUNTER — Telehealth: Payer: Self-pay | Admitting: Nurse Practitioner

## 2014-07-03 DIAGNOSIS — E559 Vitamin D deficiency, unspecified: Secondary | ICD-10-CM

## 2014-07-03 NOTE — Telephone Encounter (Signed)
Patient notified of results. Patient expressed understanding. Lab appt scheduled.

## 2014-07-03 NOTE — Telephone Encounter (Signed)
pls call pt: Advise No blood in urine, but it was very concentrated & she has calcium oxylate crystals in urine which can cause kidney stones. She should increase fluids daily, throughout day so she does not develop stones.  vit D too low. Start vit D3 5000 iu qd w/meal for 12 weeks then I will check level again. Pls schedule lab appt. In 12 weeks: 5/12 or after. All other labs look good. I will call her after I recv CT results.

## 2014-07-09 ENCOUNTER — Telehealth: Payer: Self-pay | Admitting: Nurse Practitioner

## 2014-07-09 NOTE — Telephone Encounter (Signed)
Patient notified of results. Patient expressed understanding.  

## 2014-07-09 NOTE — Telephone Encounter (Signed)
Neck CT results: 1. Negative, No mass or LAD-no hyperenhancing nodes, largest at Left IIa station measures 7-93mm X 42mm  2. Mod to severe centrilobular upper lobe pulmonary emphysema, worse on right 3. Chronic C2 spinous process fracture 4. Minimal L carotid calcified atherosclerosis  Consider ref to pulm for consult of emphysema Stop smoking Consider DEXA scan

## 2014-07-23 ENCOUNTER — Ambulatory Visit: Payer: Managed Care, Other (non HMO) | Admitting: Nurse Practitioner

## 2014-07-24 ENCOUNTER — Ambulatory Visit (INDEPENDENT_AMBULATORY_CARE_PROVIDER_SITE_OTHER): Payer: Managed Care, Other (non HMO) | Admitting: Nurse Practitioner

## 2014-07-24 ENCOUNTER — Encounter: Payer: Self-pay | Admitting: Nurse Practitioner

## 2014-07-24 VITALS — BP 100/62 | HR 87 | Temp 98.8°F | Ht 63.0 in | Wt 111.0 lb

## 2014-07-24 DIAGNOSIS — Z658 Other specified problems related to psychosocial circumstances: Secondary | ICD-10-CM

## 2014-07-24 DIAGNOSIS — F17200 Nicotine dependence, unspecified, uncomplicated: Secondary | ICD-10-CM | POA: Insufficient documentation

## 2014-07-24 DIAGNOSIS — Z716 Tobacco abuse counseling: Secondary | ICD-10-CM

## 2014-07-24 DIAGNOSIS — F439 Reaction to severe stress, unspecified: Secondary | ICD-10-CM

## 2014-07-24 DIAGNOSIS — Z720 Tobacco use: Secondary | ICD-10-CM

## 2014-07-24 DIAGNOSIS — R590 Localized enlarged lymph nodes: Secondary | ICD-10-CM

## 2014-07-24 DIAGNOSIS — L989 Disorder of the skin and subcutaneous tissue, unspecified: Secondary | ICD-10-CM

## 2014-07-24 MED ORDER — VARENICLINE TARTRATE 0.5 MG PO TABS
ORAL_TABLET | ORAL | Status: DC
Start: 2014-07-24 — End: 2015-02-24

## 2014-07-24 NOTE — Progress Notes (Signed)
Pre visit review using our clinic review tool, if applicable. No additional management support is needed unless otherwise documented below in the visit note. 

## 2014-07-24 NOTE — Progress Notes (Signed)
Subjective:     Dominique Taylor is a 39 y.o. female here to discuss neck CT results for enlarged cervical node, f/u cough, discuss smoking cessation, and face lesion. Neck CT ordered due to enlarged R anterior cervical node.Pt noticed about 1 year ago. She has not noticed change in size, but due to increased RF for cancer, imAge was ordered. Results revealed no neck mass or LAD; emphysema noted in upper lobes of lungs; C2 spinous process fracture; mild L carotid calcification. Cough is improved-still has "coughing fit" 2-3 times daily. Night time coughing resolved. No fevers, SOB or chest pain.  Smoking cessation:  She began smoking 25 yrs ago . She currently smokes 1.5 packs per day. She has attempted to quit smoking in the past, most recently 12 years ago. She tried wellbutrin, but is caused rash & itching.  Barriers to quitting include: under a lot of stress now & states smoking is how she deals with stress. We discussed she will need to think about developing new skills-taking a walk, deep breathing, etc She is interested in talking to a therapist to learn new stress mangement skills & boundary setting.  Discussed potential SE of Chantix & proper use.   Face lesion: L cheek. became raised in last 3-4 mos. Not itchy or painful. The following portions of the patient's history were reviewed and updated as appropriate: allergies, current medications, past medical history, past social history, past surgical history and problem list.  Neck CT results: 1. Negative, No mass or LAD-no hyperenhancing nodes, largest at Left IIa station measures 7-18mm X 84mm 2. Mod to severe centrilobular upper lobe pulmonary emphysema, worse on right 3. Chronic C2 spinous process fracture 4. Minimal L carotid calcified atherosclerosis Review of Systems Constitutional: negative for fatigue, fevers and night sweats Cardiovascular: negative for irregular heart beat and lower extremity edema Behavioral/Psych: positive for  tobacco use, negative for depression, excessive alcohol consumption, irritability and mood swings    Objective:    BP 100/62 mmHg  Pulse 87  Temp(Src) 98.8 F (37.1 C) (Oral)  Ht 5\' 3"  (1.6 m)  Wt 111 lb (50.349 kg)  BMI 19.67 kg/m2  SpO2 95%  LMP 06/04/2014 General appearance: alert, cooperative, appears older than stated age, no distress and very thin Head: Normocephalic, without obvious abnormality, atraumatic Eyes: negative findings: lids and lashes normal and conjunctivae and sclerae normal Neck: no carotid bruit and bilat enlarged anterior cervical nodes R larger than L- ovoid shape, NT, R more superficial & about 1 cm Lungs: clear to auscultation bilaterally Heart: regular rate and rhythm, S1, S2 normal, no murmur, click, rub or gallop Neurologic: Grossly normal    Assessment:Plan    1. Stress Need to learn new coping methods other than smoking - Ambulatory referral to Psychology  2. Face lesion L cheek Seborrheic keratosis? - Ambulatory referral to Dermatology  3. Encounter for smoking cessation counseling  Tobacco Use/Cessation.  I assessed Dominique Taylor to be in an action stage with respect to tobacco use.  Discussed setting quit date, proper use, potential SE - varenicline (CHANTIX) 0.5 MG tablet; Take 1/2 tab po qhs X 3d, then 1/2 T PO q12h X 4d, then 1 T PO q12h  Dispense: 53 tablet; Refill: 0 4. Lymphadenopathy of right cervical region Monitor  F/u 4 weeks-chantix

## 2014-07-24 NOTE — Patient Instructions (Addendum)
See dermatology.  See therapist.  Set quit date. Start chantix 1 week before quit date. Take as directed. Let me know if you have symptoms that concern you.  See me in 4 weeks.  Smoking Cessation, Tips for Success If you are ready to quit smoking, congratulations! You have chosen to help yourself be healthier. Cigarettes bring nicotine, tar, carbon monoxide, and other irritants into your body. Your lungs, heart, and blood vessels will be able to work better without these poisons. There are many different ways to quit smoking. Nicotine gum, nicotine patches, a nicotine inhaler, or nicotine nasal spray can help with physical craving. Hypnosis, support groups, and medicines help break the habit of smoking. WHAT THINGS CAN I DO TO MAKE QUITTING EASIER?  Here are some tips to help you quit for good:  Pick a date when you will quit smoking completely. Tell all of your friends and family about your plan to quit on that date.  Do not try to slowly cut down on the number of cigarettes you are smoking. Pick a quit date and quit smoking completely starting on that day.  Throw away all cigarettes.   Clean and remove all ashtrays from your home, work, and car.  On a card, write down your reasons for quitting. Carry the card with you and read it when you get the urge to smoke.  Cleanse your body of nicotine. Drink enough water and fluids to keep your urine clear or pale yellow. Do this after quitting to flush the nicotine from your body.  Learn to predict your moods. Do not let a bad situation be your excuse to have a cigarette. Some situations in your life might tempt you into wanting a cigarette.  Never have "just one" cigarette. It leads to wanting another and another. Remind yourself of your decision to quit.  Change habits associated with smoking. If you smoked while driving or when feeling stressed, try other activities to replace smoking. Stand up when drinking your coffee. Brush your teeth  after eating. Sit in a different chair when you read the paper. Avoid alcohol while trying to quit, and try to drink fewer caffeinated beverages. Alcohol and caffeine may urge you to smoke.  Avoid foods and drinks that can trigger a desire to smoke, such as sugary or spicy foods and alcohol.  Ask people who smoke not to smoke around you.  Have something planned to do right after eating or having a cup of coffee. For example, plan to take a walk or exercise.  Try a relaxation exercise to calm you down and decrease your stress. Remember, you may be tense and nervous for the first 2 weeks after you quit, but this will pass.  Find new activities to keep your hands busy. Play with a pen, coin, or rubber band. Doodle or draw things on paper.  Brush your teeth right after eating. This will help cut down on the craving for the taste of tobacco after meals. You can also try mouthwash.   Use oral substitutes in place of cigarettes. Try using lemon drops, carrots, cinnamon sticks, or chewing gum. Keep them handy so they are available when you have the urge to smoke.  When you have the urge to smoke, try deep breathing.  Designate your home as a nonsmoking area.  If you are a heavy smoker, ask your health care provider about a prescription for nicotine chewing gum. It can ease your withdrawal from nicotine.  Reward yourself. Set aside the  cigarette money you save and buy yourself something nice.  Look for support from others. Join a support group or smoking cessation program. Ask someone at home or at work to help you with your plan to quit smoking.  Always ask yourself, "Do I need this cigarette or is this just a reflex?" Tell yourself, "Today, I choose not to smoke," or "I do not want to smoke." You are reminding yourself of your decision to quit.  Do not replace cigarette smoking with electronic cigarettes (commonly called e-cigarettes). The safety of e-cigarettes is unknown, and some may contain  harmful chemicals.  If you relapse, do not give up! Plan ahead and think about what you will do the next time you get the urge to smoke. HOW WILL I FEEL WHEN I QUIT SMOKING? You may have symptoms of withdrawal because your body is used to nicotine (the addictive substance in cigarettes). You may crave cigarettes, be irritable, feel very hungry, cough often, get headaches, or have difficulty concentrating. The withdrawal symptoms are only temporary. They are strongest when you first quit but will go away within 10-14 days. When withdrawal symptoms occur, stay in control. Think about your reasons for quitting. Remind yourself that these are signs that your body is healing and getting used to being without cigarettes. Remember that withdrawal symptoms are easier to treat than the major diseases that smoking can cause.  Even after the withdrawal is over, expect periodic urges to smoke. However, these cravings are generally short lived and will go away whether you smoke or not. Do not smoke! WHAT RESOURCES ARE AVAILABLE TO HELP ME QUIT SMOKING? Your health care provider can direct you to community resources or hospitals for support, which may include:  Group support.  Education.  Hypnosis.  Therapy. Document Released: 01/29/2004 Document Revised: 09/16/2013 Document Reviewed: 10/18/2012 [facility] Patient Information 2015 [place], [place]. This information is not intended to replace advice given to you by your health care provider. Make sure you discuss any questions you have with your health care provider.

## 2014-07-30 ENCOUNTER — Encounter: Payer: Self-pay | Admitting: Nurse Practitioner

## 2014-08-20 ENCOUNTER — Ambulatory Visit: Payer: Managed Care, Other (non HMO) | Admitting: Nurse Practitioner

## 2014-08-27 ENCOUNTER — Other Ambulatory Visit: Payer: Self-pay | Admitting: Dermatology

## 2014-09-09 ENCOUNTER — Encounter: Payer: Self-pay | Admitting: Nurse Practitioner

## 2014-09-25 ENCOUNTER — Other Ambulatory Visit: Payer: Managed Care, Other (non HMO)

## 2014-12-23 ENCOUNTER — Encounter: Payer: Self-pay | Admitting: Nurse Practitioner

## 2015-02-24 ENCOUNTER — Ambulatory Visit (INDEPENDENT_AMBULATORY_CARE_PROVIDER_SITE_OTHER): Payer: Managed Care, Other (non HMO) | Admitting: Family Medicine

## 2015-02-24 ENCOUNTER — Ambulatory Visit (HOSPITAL_BASED_OUTPATIENT_CLINIC_OR_DEPARTMENT_OTHER)
Admission: RE | Admit: 2015-02-24 | Discharge: 2015-02-24 | Disposition: A | Discharge status: Home / Self Care | Payer: Managed Care, Other (non HMO) | Source: Ambulatory Visit | Attending: Family Medicine | Admitting: Family Medicine

## 2015-02-24 ENCOUNTER — Encounter: Payer: Self-pay | Admitting: Family Medicine

## 2015-02-24 VITALS — BP 116/73 | HR 77 | Temp 99.8°F | Resp 18 | Ht 63.0 in | Wt 116.0 lb

## 2015-02-24 DIAGNOSIS — Z716 Tobacco abuse counseling: Secondary | ICD-10-CM | POA: Diagnosis not present

## 2015-02-24 DIAGNOSIS — I6523 Occlusion and stenosis of bilateral carotid arteries: Secondary | ICD-10-CM | POA: Diagnosis not present

## 2015-02-24 DIAGNOSIS — R42 Dizziness and giddiness: Secondary | ICD-10-CM | POA: Diagnosis not present

## 2015-02-24 DIAGNOSIS — R590 Localized enlarged lymph nodes: Secondary | ICD-10-CM | POA: Diagnosis not present

## 2015-02-24 DIAGNOSIS — R0989 Other specified symptoms and signs involving the circulatory and respiratory systems: Secondary | ICD-10-CM | POA: Diagnosis not present

## 2015-02-24 DIAGNOSIS — Z720 Tobacco use: Secondary | ICD-10-CM | POA: Diagnosis not present

## 2015-02-24 DIAGNOSIS — Z87891 Personal history of nicotine dependence: Secondary | ICD-10-CM | POA: Insufficient documentation

## 2015-02-24 HISTORY — DX: Other specified symptoms and signs involving the circulatory and respiratory systems: R09.89

## 2015-02-24 LAB — CBC WITH DIFFERENTIAL/PLATELET
BASOS ABS: 0.1 10*3/uL (ref 0.0–0.1)
Basophils Relative: 1 % (ref 0–1)
EOS ABS: 0.4 10*3/uL (ref 0.0–0.7)
Eosinophils Relative: 5 % (ref 0–5)
HCT: 41.5 % (ref 36.0–46.0)
Hemoglobin: 14.3 g/dL (ref 12.0–15.0)
Lymphocytes Relative: 32 % (ref 12–46)
Lymphs Abs: 2.8 10*3/uL (ref 0.7–4.0)
MCH: 33.8 pg (ref 26.0–34.0)
MCHC: 34.5 g/dL (ref 30.0–36.0)
MCV: 98.1 fL (ref 78.0–100.0)
MONOS PCT: 7 % (ref 3–12)
MPV: 9.6 fL (ref 8.6–12.4)
Monocytes Absolute: 0.6 10*3/uL (ref 0.1–1.0)
NEUTROS PCT: 55 % (ref 43–77)
Neutro Abs: 4.7 10*3/uL (ref 1.7–7.7)
Platelets: 282 10*3/uL (ref 150–400)
RBC: 4.23 MIL/uL (ref 3.87–5.11)
RDW: 12.8 % (ref 11.5–15.5)
WBC: 8.6 10*3/uL (ref 4.0–10.5)

## 2015-02-24 MED ORDER — AMOXICILLIN-POT CLAVULANATE 875-125 MG PO TABS: 1.0000 | ORAL_TABLET | Freq: Two times a day (BID) | ORAL | Status: DC

## 2015-02-24 MED ORDER — VARENICLINE TARTRATE 0.5 MG PO TABS: ORAL_TABLET | ORAL | Status: DC

## 2015-02-24 NOTE — Progress Notes (Signed)
Pre visit review using our clinic review tool, if applicable. No additional management support is needed unless otherwise documented below in the visit note. 

## 2015-02-24 NOTE — Progress Notes (Signed)
Subjective:    Patient ID: Dominique Taylor, female    DOB: Feb 25, 1976, 39 y.o.   MRN: [id number]  HPI   Right cervical lymphadenopathy: Patient presents to office visit for right neck pain. She states she's had a lymph node on that side that becomes inflamed in the past and she noticed a couple of days ago that it became sore. She states she had a imaging study of her neck in the past that showed a benign lymph node. She reports having a few rounds of antibiotics and it stopped being tender. She reports that she does not believe the lymph node has increased in size from her prior study, a chest has become tender. She denies any associated fever, chills, facial pressure, ear pain, eye pain, nasal drainage, sore throat or cough. She denies any nausea, vomit, diarrhea or rash. She denies headache. She is not been around any sick contacts to her knowledge. Patient isn't every day smoker.  Smoking cessation: Patient is interested in attempting to quit smoking again. She tried over the summertime, but states she never really truly picked a "quit date ". She did not feel committed to it at that time. She did take the Chantix but never was able to quit. When she ran out of the chance X she did not ask for any refills. She now feels like she would like to try to quit in December.  Past Medical History  Diagnosis Date  . Abnormal Pap smear   . Cancer ([place])     ovarian cervical ca  . HPV (human papilloma virus) infection    Allergies  Allergen Reactions  . Wellbutrin [Bupropion] Itching   Past Surgical History  Procedure Laterality Date  . Colposcopy    . Tubal ligation    . Breast enhancement surgery     Family History  Problem Relation Age of Onset  . Arthritis Mother     RA  . Cancer Mother     Ovarian  . Depression Father   . Hypertension Father   . Diabetes Father   . Ulcerative colitis Father   . Hypertension Brother    Social History   Social History  . Marital Status:  Married    Spouse Name: N/A  . Number of Children: 2  . Years of Education: N/A   Occupational History  . Not on file.   Social History Main Topics  . Smoking status: Current Every Day Smoker -- 1.00 packs/day for 23 years    Types: Cigarettes  . Smokeless tobacco: Never Used  . Alcohol Use: No  . Drug Use: No  . Sexual Activity: Yes   Other Topics Concern  . Not on file   Social History Narrative   Ms Irigoyen lives w/her husband & 2 grown children. She works Medical laboratory scientific officer.    Review of Systems Negative, with the exception of above mentioned in HPI     Objective:   Physical Exam BP 116/73 mmHg  Pulse 77  Temp(Src) 99.8 F (37.7 C) (Temporal)  Resp 18  Ht 5\' 3"  (1.6 m)  Wt 116 lb (52.617 kg)  BMI 20.55 kg/m2  SpO2 97% Gen: Afebrile. No acute distress. Nontoxic in appearance, well-developed, well-nourished Caucasian female. Thin. HENT: AT. [place]. Bilateral TM visualized and normal in appearance. MMM. Bilateral nares without erythema or swelling. Throat without erythema or exudates.  Eyes:Pupils Equal Round Reactive to light, Extraocular movements intact,  Conjunctiva without redness, discharge or icterus. Neck/lymp/endocrine: Supple, right anterior cervical lymphadenopathy,  no thyromegaly CV: RRR no murmurs appreciated, no edema, right carotid bruit appreciated Chest: CTAB, no wheeze or crackles    Assessment & Plan:   1. Right carotid bruit - New - Right carotid bruit appreciated, near area of concern of prior lymph node. - Patient encouraged to take daily baby aspirin - US Carotid Duplex Bilateral; Future  2. Lymphadenopathy of right cervical region - Worsening/recurring - CBC w/Diff - amoxicillin-clavulanate (AUGMENTIN) 875-125 MG tablet; Take 1 tablet by mouth 2 (two) times daily.  Dispense: 20 tablet; Refill: 0 - Follow-up in 2 weeks, hopefully we'll have carotid studies at that time.  3. Encounter for smoking cessation counseling - Patient will like to try Chantix  again, counseled on how to start Chantix. Patient is to pick a quit date. Currently she is considering early December. Explained to her I want her to pick the date start Chantix taper up a week prior to the date. The night before her quit date, she is to remove all nicotine from the home. If she would like to try patches, I would encourage her to place a nicotine patch on her the day before her quit date. - varenicline (CHANTIX) 0.5 MG tablet; Take 1/2 tab po qhs X 3d, then 1/2 T PO q12h X 4d, then 1 T PO q12h  Dispense: 53 tablet; Refill: 0  Follow-up 2 weeks

## 2015-02-24 NOTE — Patient Instructions (Addendum)
I am going to collect labs today to check for infection. Although I think this a lymph node vs. Artery issue  I will treat as lymph and consider re-imaging neck if it does not respond to treatment, I have also placed a Carotid doppler study to evaluate that area.  Take a daily baby aspirin.   Smoking Cessation, Tips for Success If you are ready to quit smoking, congratulations! You have chosen to help yourself be healthier. Cigarettes bring nicotine, tar, carbon monoxide, and other irritants into your body. Your lungs, heart, and blood vessels will be able to work better without these poisons. There are many different ways to quit smoking. Nicotine gum, nicotine patches, a nicotine inhaler, or nicotine nasal spray can help with physical craving. Hypnosis, support groups, and medicines help break the habit of smoking. WHAT THINGS CAN I DO TO MAKE QUITTING EASIER?  Here are some tips to help you quit for good:  Pick a date when you will quit smoking completely. Tell all of your friends and family about your plan to quit on that date.  Do not try to slowly cut down on the number of cigarettes you are smoking. Pick a quit date and quit smoking completely starting on that day.  Throw away all cigarettes.   Clean and remove all ashtrays from your home, work, and car.  On a card, write down your reasons for quitting. Carry the card with you and read it when you get the urge to smoke.  Cleanse your body of nicotine. Drink enough water and fluids to keep your urine clear or pale yellow. Do this after quitting to flush the nicotine from your body.  Learn to predict your moods. Do not let a bad situation be your excuse to have a cigarette. Some situations in your life might tempt you into wanting a cigarette.  Never have "just one" cigarette. It leads to wanting another and another. Remind yourself of your decision to quit.  Change habits associated with smoking. If you smoked while driving or when  feeling stressed, try other activities to replace smoking. Stand up when drinking your coffee. Brush your teeth after eating. Sit in a different chair when you read the paper. Avoid alcohol while trying to quit, and try to drink fewer caffeinated beverages. Alcohol and caffeine may urge you to smoke.  Avoid foods and drinks that can trigger a desire to smoke, such as sugary or spicy foods and alcohol.  Ask people who smoke not to smoke around you.  Have something planned to do right after eating or having a cup of coffee. For example, plan to take a walk or exercise.  Try a relaxation exercise to calm you down and decrease your stress. Remember, you may be tense and nervous for the first 2 weeks after you quit, but this will pass.  Find new activities to keep your hands busy. Play with a pen, coin, or rubber band. Doodle or draw things on paper.  Brush your teeth right after eating. This will help cut down on the craving for the taste of tobacco after meals. You can also try mouthwash.   Use oral substitutes in place of cigarettes. Try using lemon drops, carrots, cinnamon sticks, or chewing gum. Keep them handy so they are available when you have the urge to smoke.  When you have the urge to smoke, try deep breathing.  Designate your home as a nonsmoking area.  If you are a heavy smoker, ask your health  care provider about a prescription for nicotine chewing gum. It can ease your withdrawal from nicotine.  Reward yourself. Set aside the cigarette money you save and buy yourself something nice.  Look for support from others. Join a support group or smoking cessation program. Ask someone at home or at work to help you with your plan to quit smoking.  Always ask yourself, "Do I need this cigarette or is this just a reflex?" Tell yourself, "Today, I choose not to smoke," or "I do not want to smoke." You are reminding yourself of your decision to quit.  Do not replace cigarette smoking with  electronic cigarettes (commonly called e-cigarettes). The safety of e-cigarettes is unknown, and some may contain harmful chemicals.  If you relapse, do not give up! Plan ahead and think about what you will do the next time you get the urge to smoke. HOW WILL I FEEL WHEN I QUIT SMOKING? You may have symptoms of withdrawal because your body is used to nicotine (the addictive substance in cigarettes). You may crave cigarettes, be irritable, feel very hungry, cough often, get headaches, or have difficulty concentrating. The withdrawal symptoms are only temporary. They are strongest when you first quit but will go away within 10-14 days. When withdrawal symptoms occur, stay in control. Think about your reasons for quitting. Remind yourself that these are signs that your body is healing and getting used to being without cigarettes. Remember that withdrawal symptoms are easier to treat than the major diseases that smoking can cause.  Even after the withdrawal is over, expect periodic urges to smoke. However, these cravings are generally short lived and will go away whether you smoke or not. Do not smoke! WHAT RESOURCES ARE AVAILABLE TO HELP ME QUIT SMOKING? Your health care provider can direct you to community resources or hospitals for support, which may include:  Group support.  Education.  Hypnosis.  Therapy.   This information is not intended to replace advice given to you by your health care provider. Make sure you discuss any questions you have with your health care provider.   Document Released: 01/29/2004 Document Revised: 05/23/2014 Document Reviewed: 10/18/2012 Elsevier Interactive Patient Education [employer].

## 2015-02-25 ENCOUNTER — Telehealth: Payer: Self-pay | Admitting: Family Medicine

## 2015-02-25 NOTE — Telephone Encounter (Signed)
Patient aware of results.  Pt has no questions at this time.

## 2015-02-25 NOTE — Telephone Encounter (Signed)
Please call patient, her lab results have returned and appeared to be normal. I also see she had her carotid Dopplers completed, these do not have anything of major concern. We will review in more detail on her follow-up appointment within a few weeks.

## 2015-03-10 ENCOUNTER — Ambulatory Visit (INDEPENDENT_AMBULATORY_CARE_PROVIDER_SITE_OTHER): Payer: Managed Care, Other (non HMO) | Admitting: Family Medicine

## 2015-03-10 ENCOUNTER — Encounter: Payer: Self-pay | Admitting: Family Medicine

## 2015-03-10 VITALS — BP 105/72 | HR 72 | Temp 97.9°F | Resp 20 | Wt 113.5 lb

## 2015-03-10 DIAGNOSIS — R590 Localized enlarged lymph nodes: Secondary | ICD-10-CM | POA: Diagnosis not present

## 2015-03-10 DIAGNOSIS — Z716 Tobacco abuse counseling: Secondary | ICD-10-CM | POA: Diagnosis not present

## 2015-03-10 DIAGNOSIS — Z0000 Encounter for general adult medical examination without abnormal findings: Secondary | ICD-10-CM | POA: Diagnosis not present

## 2015-03-10 DIAGNOSIS — Z23 Encounter for immunization: Secondary | ICD-10-CM | POA: Diagnosis not present

## 2015-03-10 DIAGNOSIS — Z720 Tobacco use: Secondary | ICD-10-CM

## 2015-03-10 NOTE — Progress Notes (Signed)
   Subjective:    Patient ID: Dominique Taylor, female    DOB: 04/06/1976, 39 y.o.   MRN: [id number]  HPI  Lymphadenopathy: Patient states that the lymph node that was present prior with tenderness has improved with antibiotic use. She states it's no longer tender. He feels that reduced in size. She feels like she also had low but of a virus over the weekend, and at that time she felt like it became swollen again. She states today is back to normal. He denies tenderness. She denies any fever, unintentional weight loss, night sweats or other swollen lymph nodes.   Smoking cessation: Patient has decided to stop smoking the third week in November. She reports she's become at that she will be able to quit this time. She has Chantix to start.  Preventative measure: Shot indicated, pneumococcal series indicated  Past Medical History  Diagnosis Date  . Abnormal Pap smear   . Cancer ([place])     ovarian cervical ca  . HPV (human papilloma virus) infection    Allergies  Allergen Reactions  . Wellbutrin [Bupropion] Itching   Social History   Social History  . Marital Status: Married    Spouse Name: N/A  . Number of Children: 2  . Years of Education: N/A   Occupational History  . Not on file.   Social History Main Topics  . Smoking status: Current Every Day Smoker -- 1.00 packs/day for 23 years    Types: Cigarettes  . Smokeless tobacco: Never Used  . Alcohol Use: No  . Drug Use: No  . Sexual Activity: Yes   Other Topics Concern  . Not on file   Social History Narrative   [name] lives w/her husband & 2 grown children. She works Medical laboratory scientific officer.   Review of Systems Negative, with the exception of above mentioned in HPI     Objective:   Physical Exam BP 105/72 mmHg  Pulse 72  Temp(Src) 97.9 F (36.6 C) (Oral)  Resp 20  Wt 113 lb 8 oz (51.483 kg)  SpO2 96%  LMP 02/05/2015 Gen: Afebrile. No acute distress.  HENT: AT. [place]. Bilateral TM visualized and normal in appearance. MMM. Throat  without erythema or exudates.  Eyes:Pupils Equal Round Reactive to light, Extraocular movements intact,  Conjunctiva without redness, discharge or icterus. Neck/lymp/endocrine: Supple, small, nontender, mobile palpable anterior cervical lymphadenopathy     Assessment & Plan:  1. Preventative health care - Flu Vaccine QUAD 36+ mos PF IM (Fluarix & Fluzone Quad PF) - Pneumococcal polysaccharide vaccine 23-valent greater than or equal to 2yo subcutaneous/IM - PCV 13 in one year  2. Encounter for smoking cessation counseling - Patient reports she has decided to stop smoking the third weekend of November. She has high confidence that she will be able to quit. - Patient aware she needs assistance, we would be glad to help her.  3. Lymphadenopathy of right cervical region - Improved. Discussed with patient today that she likely has a reactive lymph node, seems to have improved with antibiotic use. Patient is to monitor lymph node and if becomes tender or enlarged, would consider repeat imaging and biopsy.  Follow-up in March for preventive

## 2015-03-10 NOTE — Patient Instructions (Signed)
Health Maintenance, Female Adopting a healthy lifestyle and getting preventive care can go a long way to promote health and wellness. Talk with your health care provider about what schedule of regular examinations is right for you. This is a good chance for you to check in with your provider about disease prevention and staying healthy. In between checkups, there are plenty of things you can do on your own. Experts have done a lot of research about which lifestyle changes and preventive measures are most likely to keep you healthy. Ask your health care provider for more information. WEIGHT AND DIET  Eat a healthy diet  Be sure to include plenty of vegetables, fruits, low-fat dairy products, and lean protein.  Do not eat a lot of foods high in solid fats, added sugars, or salt.  Get regular exercise. This is one of the most important things you can do for your health.  Most adults should exercise for at least 150 minutes each week. The exercise should increase your heart rate and make you sweat (moderate-intensity exercise).  Most adults should also do strengthening exercises at least twice a week. This is in addition to the moderate-intensity exercise.  Maintain a healthy weight  Body mass index (BMI) is a measurement that can be used to identify possible weight problems. It estimates body fat based on height and weight. Your health care provider can help determine your BMI and help you achieve or maintain a healthy weight.  For females 20 years of age and older:   A BMI below 18.5 is considered underweight.  A BMI of 18.5 to 24.9 is normal.  A BMI of 25 to 29.9 is considered overweight.  A BMI of 30 and above is considered obese.  Watch levels of cholesterol and blood lipids  You should start having your blood tested for lipids and cholesterol at 41 years of age, then have this test every 5 years.  You may need to have your cholesterol levels checked more often if:  Your lipid  or cholesterol levels are high.  You are older than 41 years of age.  You are at high risk for heart disease.  CANCER SCREENING   Lung Cancer  Lung cancer screening is recommended for adults 55-80 years old who are at high risk for lung cancer because of a history of smoking.  A yearly low-dose CT scan of the lungs is recommended for people who:  Currently smoke.  Have quit within the past 15 years.  Have at least a 30-pack-year history of smoking. A pack year is smoking an average of one pack of cigarettes a day for 1 year.  Yearly screening should continue until it has been 15 years since you quit.  Yearly screening should stop if you develop a health problem that would prevent you from having lung cancer treatment.  Breast Cancer  Practice breast self-awareness. This means understanding how your breasts normally appear and feel.  It also means doing regular breast self-exams. Let your health care provider know about any changes, no matter how small.  If you are in your 20s or 30s, you should have a clinical breast exam (CBE) by a health care provider every 1-3 years as part of a regular health exam.  If you are 40 or older, have a CBE every year. Also consider having a breast X-ray (mammogram) every year.  If you have a family history of breast cancer, talk to your health care provider about genetic screening.  If you   are at high risk for breast cancer, talk to your health care provider about having an MRI and a mammogram every year.  Breast cancer gene (BRCA) assessment is recommended for women who have family members with BRCA-related cancers. BRCA-related cancers include:  Breast.  Ovarian.  Tubal.  Peritoneal cancers.  Results of the assessment will determine the need for genetic counseling and BRCA1 and BRCA2 testing. Cervical Cancer Your health care provider may recommend that you be screened regularly for cancer of the pelvic organs (ovaries, uterus, and  vagina). This screening involves a pelvic examination, including checking for microscopic changes to the surface of your cervix (Pap test). You may be encouraged to have this screening done every 3 years, beginning at age 21.  For women ages 30-65, health care providers may recommend pelvic exams and Pap testing every 3 years, or they may recommend the Pap and pelvic exam, combined with testing for human papilloma virus (HPV), every 5 years. Some types of HPV increase your risk of cervical cancer. Testing for HPV may also be done on women of any age with unclear Pap test results.  Other health care providers may not recommend any screening for nonpregnant women who are considered low risk for pelvic cancer and who do not have symptoms. Ask your health care provider if a screening pelvic exam is right for you.  If you have had past treatment for cervical cancer or a condition that could lead to cancer, you need Pap tests and screening for cancer for at least 20 years after your treatment. If Pap tests have been discontinued, your risk factors (such as having a new sexual partner) need to be reassessed to determine if screening should resume. Some women have medical problems that increase the chance of getting cervical cancer. In these cases, your health care provider may recommend more frequent screening and Pap tests. Colorectal Cancer  This type of cancer can be detected and often prevented.  Routine colorectal cancer screening usually begins at 41 years of age and continues through 41 years of age.  Your health care provider may recommend screening at an earlier age if you have risk factors for colon cancer.  Your health care provider may also recommend using home test kits to check for hidden blood in the stool.  A small camera at the end of a tube can be used to examine your colon directly (sigmoidoscopy or colonoscopy). This is done to check for the earliest forms of colorectal  cancer.  Routine screening usually begins at age 50.  Direct examination of the colon should be repeated every 5-10 years through 41 years of age. However, you may need to be screened more often if early forms of precancerous polyps or small growths are found. Skin Cancer  Check your skin from head to toe regularly.  Tell your health care provider about any new moles or changes in moles, especially if there is a change in a mole's shape or color.  Also tell your health care provider if you have a mole that is larger than the size of a pencil eraser.  Always use sunscreen. Apply sunscreen liberally and repeatedly throughout the day.  Protect yourself by wearing long sleeves, pants, a wide-brimmed hat, and sunglasses whenever you are outside. HEART DISEASE, DIABETES, AND HIGH BLOOD PRESSURE   High blood pressure causes heart disease and increases the risk of stroke. High blood pressure is more likely to develop in:  People who have blood pressure in the high end   of the normal range (130-139/85-89 mm Hg).  People who are overweight or obese.  People who are African American.  If you are 38-23 years of age, have your blood pressure checked every 3-5 years. If you are 61 years of age or older, have your blood pressure checked every year. You should have your blood pressure measured twice--once when you are at a hospital or clinic, and once when you are not at a hospital or clinic. Record the average of the two measurements. To check your blood pressure when you are not at a hospital or clinic, you can use:  An automated blood pressure machine at a pharmacy.  A home blood pressure monitor.  If you are between 45 years and 39 years old, ask your health care provider if you should take aspirin to prevent strokes.  Have regular diabetes screenings. This involves taking a blood sample to check your fasting blood sugar level.  If you are at a normal weight and have a low risk for diabetes,  have this test once every three years after 41 years of age.  If you are overweight and have a high risk for diabetes, consider being tested at a younger age or more often. PREVENTING INFECTION  Hepatitis B  If you have a higher risk for hepatitis B, you should be screened for this virus. You are considered at high risk for hepatitis B if:  You were born in a country where hepatitis B is common. Ask your health care provider which countries are considered high risk.  Your parents were born in a high-risk country, and you have not been immunized against hepatitis B (hepatitis B vaccine).  You have HIV or AIDS.  You use needles to inject street drugs.  You live with someone who has hepatitis B.  You have had sex with someone who has hepatitis B.  You get hemodialysis treatment.  You take certain medicines for conditions, including cancer, organ transplantation, and autoimmune conditions. Hepatitis C  Blood testing is recommended for:  Everyone born from [birth year] through 1967.  Anyone with known risk factors for hepatitis C. Sexually transmitted infections (STIs)  You should be screened for sexually transmitted infections (STIs) including gonorrhea and chlamydia if:  You are sexually active and are younger than 41 years of age.  You are older than 41 years of age and your health care provider tells you that you are at risk for this type of infection.  Your sexual activity has changed since you were last screened and you are at an increased risk for chlamydia or gonorrhea. Ask your health care provider if you are at risk.  If you do not have HIV, but are at risk, it may be recommended that you take a prescription medicine daily to prevent HIV infection. This is called pre-exposure prophylaxis (PrEP). You are considered at risk if:  You are sexually active and do not regularly use condoms or know the HIV status of your partner(s).  You take drugs by injection.  You are sexually  active with a partner who has HIV. Talk with your health care provider about whether you are at high risk of being infected with HIV. If you choose to begin PrEP, you should first be tested for HIV. You should then be tested every 3 months for as long as you are taking PrEP.  PREGNANCY   If you are premenopausal and you may become pregnant, ask your health care provider about preconception counseling.  If you may  become pregnant, take 400 to 800 micrograms (mcg) of folic acid every day.  If you want to prevent pregnancy, talk to your health care provider about birth control (contraception). OSTEOPOROSIS AND MENOPAUSE   Osteoporosis is a disease in which the bones lose minerals and strength with aging. This can result in serious bone fractures. Your risk for osteoporosis can be identified using a bone density scan.  If you are 33 years of age or older, or if you are at risk for osteoporosis and fractures, ask your health care provider if you should be screened.  Ask your health care provider whether you should take a calcium or vitamin D supplement to lower your risk for osteoporosis.  Menopause may have certain physical symptoms and risks.  Hormone replacement therapy may reduce some of these symptoms and risks. Talk to your health care provider about whether hormone replacement therapy is right for you.  HOME CARE INSTRUCTIONS   Schedule regular health, dental, and eye exams.  Stay current with your immunizations.   Do not use any tobacco products including cigarettes, chewing tobacco, or electronic cigarettes.  If you are pregnant, do not drink alcohol.  If you are breastfeeding, limit how much and how often you drink alcohol.  Limit alcohol intake to no more than 1 drink per day for nonpregnant women. One drink equals 12 ounces of beer, 5 ounces of wine, or 1 ounces of hard liquor.  Do not use street drugs.  Do not share needles.  Ask your health care provider for help if  you need support or information about quitting drugs.  Tell your health care provider if you often feel depressed.  Tell your health care provider if you have ever been abused or do not feel safe at home.   This information is not intended to replace advice given to you by your health care provider. Make sure you discuss any questions you have with your health care provider.   Document Released: 11/15/2010 Document Revised: 05/23/2014 Document Reviewed: 04/03/2013 Elsevier Interactive Patient Education 2016 [place] with quitting smoking. You can do this.  Try exercise > 150 minutes of week.  Eat a low saturated/trans fat diet.  If you notice the lymph node getting larger or more painful I would want to re-image and consider referring you for biopsy.

## 2016-02-23 ENCOUNTER — Ambulatory Visit: Payer: Managed Care, Other (non HMO)

## 2016-02-29 ENCOUNTER — Ambulatory Visit (INDEPENDENT_AMBULATORY_CARE_PROVIDER_SITE_OTHER): Payer: Managed Care, Other (non HMO) | Admitting: Family Medicine

## 2016-02-29 ENCOUNTER — Encounter: Payer: Self-pay | Admitting: Family Medicine

## 2016-02-29 ENCOUNTER — Telehealth: Payer: Self-pay | Admitting: Family Medicine

## 2016-02-29 VITALS — BP 113/78 | HR 80 | Temp 99.1°F | Resp 18 | Wt 112.0 lb

## 2016-02-29 DIAGNOSIS — Z23 Encounter for immunization: Secondary | ICD-10-CM | POA: Diagnosis not present

## 2016-02-29 DIAGNOSIS — F418 Other specified anxiety disorders: Secondary | ICD-10-CM | POA: Insufficient documentation

## 2016-02-29 MED ORDER — ESCITALOPRAM OXALATE 10 MG PO TABS: 10.0000 mg | ORAL_TABLET | Freq: Every day | ORAL | 0 refills | Status: DC

## 2016-02-29 NOTE — Progress Notes (Addendum)
Dominique Taylor , Dec 02, 1975, 40 y.o., female MRN: [id number] Patient Care Team    Relationship Specialty Notifications Start End  Ma Hillock, DO PCP - General Family Medicine  02/29/16     CC: Neck pain  Subjective: Pt presents for an acute OV with complaints of neck pain of 1.5 weeks duration.  Associated symptoms include increased stress at work and in her home life. The pain is located mid neck over the bone area and muscles. She has not had neck pain like this before. She denies fever, chills, headache, radiation of pain, numbness or tingling in her extremities. She denies injury or arthritis. She states the pain is not constant and is more aggreviating, she thinks it is probably from her stress. She reports nothing seems to make the pain worse or better. She has not tried any OTC therapy or heat. She states her stress level is more than she can handle lately. She states the office environment is negative and there is a great deal of animosity there. Her home life is overwhelming with her step daughter. Her husband has a 59 year old daughter that is very complicated and is in trouble a great deal. It has started to cause problems in her relationship and life. She reports she has two adult children of her own (46 and 66) and now a new grand-baby. She reports the grand-baby is only thing that gives her any joy any longer. She has never been on a medication for depression or anxiety. She was tried on bupropion in the past for smoking cessation and was allergic.   Mood disorder: negative Depression screen PHQ 2/9 02/29/2016  Decreased Interest 1  Down, Depressed, Hopeless 3  PHQ - 2 Score 4  Altered sleeping 1  Tired, decreased energy 2  Change in appetite 2  Feeling bad or failure about yourself  3  Trouble concentrating 3  Moving slowly or fidgety/restless 3  Suicidal thoughts 1  PHQ-9 Score 19   GAD 7 : Generalized Anxiety Score 02/29/2016  Nervous, Anxious, on Edge 3    Control/stop worrying 3  Worry too much - different things 2  Trouble relaxing 2  Restless 1  Easily annoyed or irritable 3  Afraid - awful might happen 2  Total GAD 7 Score 16     Allergies  Allergen Reactions  . Wellbutrin [Bupropion] Itching   Social History  Substance Use Topics  . Smoking status: Current Every Day Smoker    Packs/day: 1.00    Years: 23.00    Types: Cigarettes  . Smokeless tobacco: Never Used  . Alcohol use No   Past Medical History:  Diagnosis Date  . Abnormal Pap smear   . Cancer ([place])    ovarian cervical ca  . HPV (human papilloma virus) infection    Past Surgical History:  Procedure Laterality Date  . BREAST ENHANCEMENT SURGERY    . COLPOSCOPY    . TUBAL LIGATION     Family History  Problem Relation Age of Onset  . Arthritis Mother     RA  . Cancer Mother     Ovarian  . Depression Father   . Hypertension Father   . Diabetes Father   . Ulcerative colitis Father   . Hypertension Brother      Medication List       Accurate as of 02/29/16 11:03 AM. Always use your most recent med list.          albuterol 108 (  90 Base) MCG/ACT inhaler Commonly known as:  PROVENTIL HFA;VENTOLIN HFA Inhale 2 puffs into the lungs every 6 (six) hours as needed for wheezing.   prenatal multivitamin Tabs tablet Take 1 tablet by mouth daily at 12 noon.       No results found for this or any previous visit (from the past 24 hour(s)). No results found.   ROS: Negative, with the exception of above mentioned in HPI   Objective:  BP 113/78 (BP Location: Right Arm, Patient Position: Sitting, Cuff Size: Normal)   Pulse 80   Temp 99.1 F (37.3 C)   Resp 18   Wt 112 lb (50.8 kg)   SpO2 96%   BMI 19.84 kg/m  Body mass index is 19.84 kg/m. Gen: Afebrile. No acute distress. Nontoxic in appearance, well developed, well nourished.  HENT: AT. [place]. MMM Eyes:Pupils Equal Round Reactive to light, Extraocular movements intact,  Conjunctiva without  redness, discharge or icterus. CV: RRR MSK/Neuro: Normal gait. PERLA. EOMi. Alert. Oriented x3 Cranial nerves II through XII intact. Muscle strength 5/5 bilateral upper extremity. No bone or muscle TTP. Ropey upper trap bilateral. NV intact distally.  Psych: Tearful, overwhelmed, sad. Normal dress and demeanor. Normal speech. Normal thought content and judgment.  Assessment/Plan: Dominique Taylor is a 40 y.o. female present for OV for  Depression with anxiety - PHQ/GAD assessment completed today with significant depression/anxiety results.  - discussed results with pt today and we decided to start medication therapy. Discussed the need to stay on medication for at least 6 months duration, longer if needed.   - start lexapro 10 mg daily - follow up in 4 weeks, if needed will increse dose at that time.   -Flu shot today and  Prevnar 13 administered today.  > 25 minutes spent with patient, >50% of time spent face to face counseling patient and coordinating care.  electronically signed by:  Howard Pouch, DO  [place]

## 2016-02-29 NOTE — Telephone Encounter (Signed)
Patient calling to state she forgot to get a doctor's note for work today at her office visit.  Please print out work note and call patient when note is ready for pick up.

## 2016-02-29 NOTE — Patient Instructions (Signed)
It was a pleasure seeing you again.  Start medication today and follow up in about 4 weeks before the medication needs refilled. If needed the dose will be adjusted at that time.

## 2016-02-29 NOTE — Telephone Encounter (Signed)
Note printed patient notified.

## 2016-03-25 ENCOUNTER — Ambulatory Visit (INDEPENDENT_AMBULATORY_CARE_PROVIDER_SITE_OTHER): Payer: Managed Care, Other (non HMO) | Admitting: Family Medicine

## 2016-03-25 ENCOUNTER — Encounter: Payer: Self-pay | Admitting: Family Medicine

## 2016-03-25 DIAGNOSIS — F418 Other specified anxiety disorders: Secondary | ICD-10-CM | POA: Diagnosis not present

## 2016-03-25 MED ORDER — ESCITALOPRAM OXALATE 20 MG PO TABS: 20.0000 mg | ORAL_TABLET | Freq: Every day | ORAL | 0 refills | Status: DC

## 2016-03-25 NOTE — Patient Instructions (Signed)
I am  glad you are doing well.  We increased dose to 20 mg daily today.  I will follow up with you in 3 months (before script needed) and if doing well then, will start every 6 months followups.

## 2016-03-25 NOTE — Progress Notes (Signed)
Dominique Taylor , 05/07/1976, 40 y.o., female MRN: [id number] Patient Care Team    Relationship Specialty Notifications Start End  Dominique Hillock, DO PCP - General Family Medicine  02/29/16     CC: depression and anxiety Subjective:   depression and anxiety: pt presents for follow up on depression and anxiety today. She started lexapro 10 mg QD about 4 weeks ago. She is tolerating medication without side effects and feels it is helping her. She feels more in control of her emotions and is less irritable. She does feel it "wears off" mid day. She denies continued neck pain.   Pt presents for an acute OV with complaints of neck pain of 1.5 weeks duration.  Associated symptoms include increased stress at work and in her home life. The pain is located mid neck over the bone area and muscles. She has not had neck pain like this before. She denies fever, chills, headache, radiation of pain, numbness or tingling in her extremities. She denies injury or arthritis. She states the pain is not constant and is more aggreviating, she thinks it is probably from her stress. She reports nothing seems to make the pain worse or better. She has not tried any OTC therapy or heat. She states her stress level is more than she can handle lately. She states the office environment is negative and there is a great deal of animosity there. Her home life is overwhelming with her step daughter. Her husband has a 9 year old daughter that is very complicated and is in trouble a great deal. It has started to cause problems in her relationship and life. She reports she has two adult children of her own (2 and 6) and now a new grand-baby. She reports the grand-baby is only thing that gives her any joy any longer. She has never been on a medication for depression or anxiety. She was tried on bupropion in the past for smoking cessation and was allergic.   Mood disorder: negative Depression screen PHQ 2/9 02/29/2016  Decreased  Interest 1  Down, Depressed, Hopeless 3  PHQ - 2 Score 4  Altered sleeping 1  Tired, decreased energy 2  Change in appetite 2  Feeling bad or failure about yourself  3  Trouble concentrating 3  Moving slowly or fidgety/restless 3  Suicidal thoughts 1  PHQ-9 Score 19   GAD 7 : Generalized Anxiety Score 02/29/2016  Nervous, Anxious, on Edge 3  Control/stop worrying 3  Worry too much - different things 2  Trouble relaxing 2  Restless 1  Easily annoyed or irritable 3  Afraid - awful might happen 2  Total GAD 7 Score 16     Allergies  Allergen Reactions  . Wellbutrin [Bupropion] Itching   Social History  Substance Use Topics  . Smoking status: Current Every Day Smoker    Packs/day: 1.00    Years: 23.00    Types: Cigarettes  . Smokeless tobacco: Never Used  . Alcohol use No   Past Medical History:  Diagnosis Date  . Abnormal Pap smear   . Cancer ([place])    ovarian cervical ca  . HPV (human papilloma virus) infection    Past Surgical History:  Procedure Laterality Date  . BREAST ENHANCEMENT SURGERY    . COLPOSCOPY    . TUBAL LIGATION     Family History  Problem Relation Age of Onset  . Arthritis Mother     RA  . Cancer Mother  Ovarian  . Depression Father   . Hypertension Father   . Diabetes Father   . Ulcerative colitis Father   . Hypertension Brother      Medication List       Accurate as of 03/25/16  3:34 PM. Always use your most recent med list.          albuterol 108 (90 Base) MCG/ACT inhaler Commonly known as:  PROVENTIL HFA;VENTOLIN HFA Inhale 2 puffs into the lungs every 6 (six) hours as needed for wheezing.   escitalopram 10 MG tablet Commonly known as:  LEXAPRO Take 1 tablet (10 mg total) by mouth daily.   prenatal multivitamin Tabs tablet Take 1 tablet by mouth daily at 12 noon.       No results found for this or any previous visit (from the past 24 hour(s)). No results found.   ROS: Negative, with the exception of above  mentioned in HPI   Objective:  BP 112/67 (BP Location: Left Arm, Patient Position: Sitting, Cuff Size: Normal)   Pulse 77   Temp 98.4 F (36.9 C)   Resp 20   Wt 113 lb 4 oz (51.4 kg)   SpO2 96%   BMI 20.06 kg/m  Body mass index is 20.06 kg/m. Gen: Afebrile. No acute distress. Nontoxic in appearance, well developed, well nourished.  HENT: AT. [place]. MMM Eyes:Pupils Equal Round Reactive to light, Extraocular movements intact,  Conjunctiva without redness, discharge or icterus. CV: RRR MSK/Neuro: Normal gait. PERLA. EOMi. Alert. Oriented x3  Psych: happy, smiling. Normal dress and demeanor. Normal speech. Normal thought content and judgment.  Assessment/Plan: Dominique Taylor is a 40 y.o. female present for OV for  Depression with anxiety - PHQ/GAD assessment completed today with significant depression/anxiety results.  - discussed results with pt today and we decided to start medication therapy. Discussed the need to stay on medication for at least 6 months duration, longer if needed.   - start lexapro 10 mg daily - follow up in 4 weeks, if needed will increse dose at that time.   electronically signed by:  Dominique Pouch, DO  [place]

## 2016-05-13 ENCOUNTER — Ambulatory Visit (INDEPENDENT_AMBULATORY_CARE_PROVIDER_SITE_OTHER): Payer: Managed Care, Other (non HMO) | Admitting: Family Medicine

## 2016-05-13 ENCOUNTER — Encounter: Payer: Self-pay | Admitting: Family Medicine

## 2016-05-13 ENCOUNTER — Other Ambulatory Visit: Payer: Self-pay | Admitting: Family Medicine

## 2016-05-13 VITALS — BP 110/69 | HR 81 | Temp 98.2°F | Resp 20 | Wt 111.5 lb

## 2016-05-13 DIAGNOSIS — Z9882 Breast implant status: Secondary | ICD-10-CM

## 2016-05-13 DIAGNOSIS — N6320 Unspecified lump in the left breast, unspecified quadrant: Secondary | ICD-10-CM

## 2016-05-13 NOTE — Patient Instructions (Signed)
I have ordered your mammogram/US and they will contact you to schedule.    Please help [place] help you:  It is a privilege to be able to take care of great patients such as yourself. We are honored you have chosen [facility] for your Primary Care home. Below you will find basic instructions that you may need to access in the future. Please help [place] help you by reading the instructions, which cover many of the frequent questions we experience.   Prescription refills and request:  -In order to allow more efficient response time, please call your pharmacy for all refills. They will forward the request electronically to [place]. This allows for the quickest possible response. Request left on a nurse line can take longer to refill, since these are checked as time allows between office patients and other phone calls.  - refill request can take up to 3-5 working days to complete.  - If request is sent electronically and request is appropiate, it is usually completed in 1-2 business days.  - all patients will need to be seen routinely for all chronic medical conditions requiring prescription medications (see follow-up below). If you are overdue for follow up on your condition, you will be asked to make an appointment and we will call in enough medication to cover you until your appointment (up to 30 days).  - all controlled substances will require a face to face visit to request/refill.  - if you desire your prescriptions to go through a new pharmacy, and have an active script at original pharmacy, you will need to call your pharmacy and have scripts transferred to new pharmacy. This is completed between the pharmacy locations and not by your provider.    Results: If any images or labs were ordered, it can take up to 1 week to get results depending on the test ordered and the lab/facility running and resulting the test. - Normal or stable results, which do not need further discussion, will be released to your  mychart immediately with attached note to you. A call will not be generated for normal results. Please make certain to sign up for mychart. If you have questions on how to activate your mychart you can call the front office.  - If your results need further discussion, our office will attempt to contact you via phone, and if unable to reach you after 2 attempts, we will release your abnormal result to your mychart with instructions.  - All results will be automatically released in mychart after 1 week.  - Your provider will provide you with explanation and instruction on all relevant material in your results. Please keep in mind, results and labs may appear confusing or abnormal to the untrained eye, but it does not mean they are actually abnormal for you personally. If you have any questions about your results that are not covered, or you desire more detailed explanation than what was provided, you should make an appointment with your provider to do so.   Our office handles many outgoing and incoming calls daily. If we have not contacted you within 1 week about your results, please check your mychart to see if there is a message first and if not, then contact our office.  In helping with this matter, you help decrease call volume, and therefore allow [place] to be able to respond to patients needs more efficiently.   Acute office visits (sick visit):  An acute visit is intended for a new problem and  are scheduled in shorter time slots to allow schedule openings for patients with new problems. This is the appropriate visit to discuss a new problem. In order to provide you with excellent quality medical care with proper time for you to explain your problem, have an exam and receive treatment with instructions, these appointments should be limited to one new problem per visit. If you experience a new problem, in which you desire to be addressed, please make an acute office visit, we save openings on the schedule to  accommodate you. Please do not save your new problem for any other type of visit, let us take care of it properly and quickly for you.   Follow up visits:  Depending on your condition(s) your provider will need to see you routinely in order to provide you with quality care and prescribe medication(s). Most chronic conditions (Example: hypertension, Diabetes, depression/anxiety... etc), require visits a couple times a year. Your provider will instruct you on proper follow up for your personal medical conditions and history. Please make certain to make follow up appointments for your condition as instructed. Failing to do so could result in lapse in your medication treatment/refills. If you request a refill, and are overdue to be seen on a condition, we will always provide you with a 30 day script (once) to allow you time to schedule.    Medicare wellness (well visit): - we have a wonderful Nurse Maudie Mercury), that will meet with you and provide you will yearly medicare wellness visits. These visits should occur yearly (can not be scheduled less than 1 calendar year apart) and cover preventive health, immunizations, advance directives and screenings you are entitled to yearly through your medicare benefits. Do not miss out on your entitled benefits, this is when medicare will pay for these benefits to be ordered for you.  These are strongly encouraged by your provider and is the appropriate type of visit to make certain you are up to date with all preventive health benefits. If you have not had your medicare wellness exam in the last 12 months, please make certain to schedule one by calling the office and schedule your medicare wellness with Maudie Mercury as soon as possible.   Yearly physical (well visit):  - Adults are recommended to be seen yearly for physicals. Check with your insurance and date of your last physical, most insurances require one calendar year between physicals. Physicals include all preventive health  topics, screenings, medical exam and labs that are appropriate for gender/age and history. You may have fasting labs needed at this visit. This is a well visit (not a sick visit), acute topics should not be covered during this visit.  - Pediatric patients are seen more frequently when they are younger. Your provider will advise you on well child visit timing that is appropriate for your their age. - This is not a medicare wellness visit. Medicare wellness exams do not have an exam portion to the visit. Some medicare companies allow for a physical, some do not allow a yearly physical. If your medicare allows a yearly physical you can schedule the medicare wellness with our nurse Maudie Mercury and have your physical with your provider after, on the same day. Please check with insurance for your full benefits.   Late Policy/No Shows:  - all new patients should arrive 15-30 minutes earlier than appointment to allow [place] time  to  obtain all personal demographics,  insurance information and for you to complete office paperwork. - All established patients  should arrive 10-15 minutes earlier than appointment time to update all information and be checked in .  - In our best efforts to run on time, if you are late for your appointment you will be asked to either reschedule or if able, we will work you back into the schedule. There will be a wait time to work you back in the schedule,  depending on availability.  - If you are unable to make it to your appointment as scheduled, please call 24 hours ahead of time to allow [place] to fill the time slot with someone else who needs to be seen. If you do not cancel your appointment ahead of time, you may be charged a no show fee.

## 2016-05-13 NOTE — Progress Notes (Signed)
Dominique Taylor , 1976-01-22, 40 y.o., female MRN: [id number] Patient Care Team    Relationship Specialty Notifications Start End  Ma Hillock, DO PCP - General Family Medicine  02/29/16     CC: breast lump Subjective: Pt presents for an acute OV with complaints of Breast mass of 1 week duration.  Associated symptoms include nothing. Patient states she noticed a breast lump in her left breast medial to the nipple line. She states she first just saw it when taking a shower. She denies pain, redness, dimpling the skin. She has had a personal history of 2 breast cysts, one on the left which was aspirated in [place], and a reported breast cyst on the right. She has saline implants placed in 1998. No family history of breast cancer, mother with history of ovarian cancer. She does not recall any injury to the area.   Allergies  Allergen Reactions  . Wellbutrin [Bupropion] Itching   Social History  Substance Use Topics  . Smoking status: Current Every Day Smoker    Packs/day: 1.00    Years: 23.00    Types: Cigarettes  . Smokeless tobacco: Never Used  . Alcohol use No   Past Medical History:  Diagnosis Date  . Abnormal Pap smear   . Cancer ([place])    ovarian cervical ca  . HPV (human papilloma virus) infection    Past Surgical History:  Procedure Laterality Date  . BREAST ENHANCEMENT SURGERY    . COLPOSCOPY    . TUBAL LIGATION     Family History  Problem Relation Age of Onset  . Arthritis Mother     RA  . Cancer Mother     Ovarian  . Depression Father   . Hypertension Father   . Diabetes Father   . Ulcerative colitis Father   . Hypertension Brother    Allergies as of 05/13/2016      Reactions   Wellbutrin [bupropion] Itching      Medication List       Accurate as of 05/13/16  9:49 AM. Always use your most recent med list.          albuterol 108 (90 Base) MCG/ACT inhaler Commonly known as:  PROVENTIL HFA;VENTOLIN HFA Inhale 2 puffs into the lungs every 6  (six) hours as needed for wheezing.   escitalopram 20 MG tablet Commonly known as:  LEXAPRO Take 1 tablet (20 mg total) by mouth daily.   prenatal multivitamin Tabs tablet Take 1 tablet by mouth daily at 12 noon.       No results found for this or any previous visit (from the past 24 hour(s)). No results found.   ROS: Negative, with the exception of above mentioned in HPI   Objective:  BP 110/69 (BP Location: Left Arm, Patient Position: Sitting, Cuff Size: Normal)   Pulse 81   Temp 98.2 F (36.8 C)   Resp 20   Wt 111 lb 8 oz (50.6 kg)   LMP 05/03/2016   SpO2 97%   BMI 19.75 kg/m  Body mass index is 19.75 kg/m. Gen: Afebrile. No acute distress. Nontoxic in appearance, well developed, well nourished.  Breasts: breasts appear normal, symmetrical, no tenderness on exam, no nipple changes or axillary nodes. Dime size flat mass located at the 11:00 position approximately 2 cm from nipple. 2 areas of small petechiae over this location. Very small light green nipple discharge expressed.  Assessment/Plan: Dominique Taylor is a 40 y.o. female present for acute OV  for  Left breast mass/breast implants - Unknown etiology, possibly secondary to trauma with to petechiae near this location. Cannot rule out malignancy or cyst formation. Will obtain diagnostic mammogram and ultrasound urgently. Patient reports her left breast frequently has small amount of discharge. - MM DIAG BREAST TOMO UNI LEFT; Future - MM DIAG BREAST TOMO UNI RIGHT; Future - US BREAST LTD UNI LEFT INC AXILLA; Future - US BREAST LTD UNI RIGHT INC AXILLA; Future - Follow-up dependent upon imaging study.   electronically signed by:  Howard Pouch, DO  [place]

## 2016-05-16 HISTORY — PX: BREAST EXCISIONAL BIOPSY: SUR124

## 2016-05-20 ENCOUNTER — Encounter: Payer: Self-pay | Admitting: Family Medicine

## 2016-05-20 ENCOUNTER — Other Ambulatory Visit: Payer: Self-pay | Admitting: Family Medicine

## 2016-05-20 ENCOUNTER — Ambulatory Visit
Admission: RE | Admit: 2016-05-20 | Discharge: 2016-05-20 | Disposition: A | Discharge status: Home / Self Care | Payer: Managed Care, Other (non HMO) | Source: Ambulatory Visit | Attending: Family Medicine | Admitting: Family Medicine

## 2016-05-20 DIAGNOSIS — N6320 Unspecified lump in the left breast, unspecified quadrant: Secondary | ICD-10-CM

## 2016-05-20 DIAGNOSIS — Z9882 Breast implant status: Secondary | ICD-10-CM

## 2016-05-26 ENCOUNTER — Ambulatory Visit
Admission: RE | Admit: 2016-05-26 | Discharge: 2016-05-26 | Disposition: A | Discharge status: Home / Self Care | Payer: Managed Care, Other (non HMO) | Source: Ambulatory Visit | Attending: Family Medicine | Admitting: Family Medicine

## 2016-05-26 ENCOUNTER — Other Ambulatory Visit: Payer: Self-pay | Admitting: Family Medicine

## 2016-05-26 DIAGNOSIS — N6320 Unspecified lump in the left breast, unspecified quadrant: Secondary | ICD-10-CM

## 2016-05-26 DIAGNOSIS — Z9882 Breast implant status: Secondary | ICD-10-CM

## 2016-06-09 ENCOUNTER — Other Ambulatory Visit: Payer: Self-pay | Admitting: General Surgery

## 2016-06-09 DIAGNOSIS — N6092 Unspecified benign mammary dysplasia of left breast: Secondary | ICD-10-CM

## 2016-06-14 ENCOUNTER — Other Ambulatory Visit: Payer: Self-pay | Admitting: General Surgery

## 2016-06-14 DIAGNOSIS — N6092 Unspecified benign mammary dysplasia of left breast: Secondary | ICD-10-CM

## 2016-06-15 ENCOUNTER — Encounter (HOSPITAL_BASED_OUTPATIENT_CLINIC_OR_DEPARTMENT_OTHER): Payer: Self-pay | Admitting: *Deleted

## 2016-06-20 ENCOUNTER — Ambulatory Visit
Admission: RE | Admit: 2016-06-20 | Discharge: 2016-06-20 | Disposition: A | Discharge status: Home / Self Care | Payer: Managed Care, Other (non HMO) | Source: Ambulatory Visit | Attending: General Surgery | Admitting: General Surgery

## 2016-06-20 DIAGNOSIS — N6092 Unspecified benign mammary dysplasia of left breast: Secondary | ICD-10-CM

## 2016-06-20 NOTE — Progress Notes (Signed)
Boost drink given with instructions to complete by 0615, pt verbalized understanding.

## 2016-06-21 ENCOUNTER — Ambulatory Visit (HOSPITAL_BASED_OUTPATIENT_CLINIC_OR_DEPARTMENT_OTHER): Payer: Managed Care, Other (non HMO) | Admitting: Certified Registered"

## 2016-06-21 ENCOUNTER — Encounter (HOSPITAL_BASED_OUTPATIENT_CLINIC_OR_DEPARTMENT_OTHER): Payer: Self-pay | Admitting: Certified Registered"

## 2016-06-21 ENCOUNTER — Ambulatory Visit
Admission: RE | Admit: 2016-06-21 | Discharge: 2016-06-21 | Disposition: A | Discharge status: Home / Self Care | Payer: Managed Care, Other (non HMO) | Source: Ambulatory Visit | Attending: General Surgery | Admitting: General Surgery

## 2016-06-21 ENCOUNTER — Ambulatory Visit (HOSPITAL_BASED_OUTPATIENT_CLINIC_OR_DEPARTMENT_OTHER)
Admission: RE | Admit: 2016-06-21 | Discharge: 2016-06-21 | Disposition: A | Discharge status: Home / Self Care | Payer: Managed Care, Other (non HMO) | Source: Ambulatory Visit | Attending: General Surgery | Admitting: General Surgery

## 2016-06-21 ENCOUNTER — Encounter (HOSPITAL_BASED_OUTPATIENT_CLINIC_OR_DEPARTMENT_OTHER)
Admission: RE | Disposition: A | Discharge status: Home / Self Care | Payer: Self-pay | Source: Ambulatory Visit | Attending: General Surgery

## 2016-06-21 DIAGNOSIS — N62 Hypertrophy of breast: Secondary | ICD-10-CM | POA: Insufficient documentation

## 2016-06-21 DIAGNOSIS — Z8041 Family history of malignant neoplasm of ovary: Secondary | ICD-10-CM | POA: Insufficient documentation

## 2016-06-21 DIAGNOSIS — N6022 Fibroadenosis of left breast: Secondary | ICD-10-CM | POA: Diagnosis not present

## 2016-06-21 DIAGNOSIS — N6092 Unspecified benign mammary dysplasia of left breast: Secondary | ICD-10-CM

## 2016-06-21 DIAGNOSIS — Z79899 Other long term (current) drug therapy: Secondary | ICD-10-CM | POA: Insufficient documentation

## 2016-06-21 DIAGNOSIS — F17210 Nicotine dependence, cigarettes, uncomplicated: Secondary | ICD-10-CM | POA: Diagnosis not present

## 2016-06-21 DIAGNOSIS — F329 Major depressive disorder, single episode, unspecified: Secondary | ICD-10-CM | POA: Insufficient documentation

## 2016-06-21 HISTORY — PX: RADIOACTIVE SEED GUIDED EXCISIONAL BREAST BIOPSY: SHX6490

## 2016-06-21 HISTORY — DX: Depression, unspecified: F32.A

## 2016-06-21 HISTORY — DX: Major depressive disorder, single episode, unspecified: F32.9

## 2016-06-21 HISTORY — DX: Unspecified lump in the left breast, unspecified quadrant: N63.20

## 2016-06-21 SURGERY — RADIOACTIVE SEED GUIDED BREAST BIOPSY
Anesthesia: General | Site: Breast | Laterality: Left

## 2016-06-21 MED ORDER — ACETAMINOPHEN 500 MG PO TABS
1000.0000 mg | ORAL_TABLET | ORAL | Status: AC
  Administered 2016-06-21: 1000 mg via ORAL

## 2016-06-21 MED ORDER — MIDAZOLAM HCL 2 MG/2ML IJ SOLN
INTRAMUSCULAR | Status: AC
  Filled 2016-06-21: qty 2

## 2016-06-21 MED ORDER — CEFAZOLIN SODIUM-DEXTROSE 2-4 GM/100ML-% IV SOLN
INTRAVENOUS | Status: AC
  Filled 2016-06-21: qty 100

## 2016-06-21 MED ORDER — LACTATED RINGERS IV SOLN
INTRAVENOUS | Status: DC
  Administered 2016-06-21 (×2): via INTRAVENOUS

## 2016-06-21 MED ORDER — MIDAZOLAM HCL 2 MG/2ML IJ SOLN
1.0000 mg | INTRAMUSCULAR | Status: DC | PRN
  Administered 2016-06-21: 2 mg via INTRAVENOUS

## 2016-06-21 MED ORDER — EPHEDRINE 5 MG/ML INJ
INTRAVENOUS | Status: AC
  Filled 2016-06-21: qty 20

## 2016-06-21 MED ORDER — ONDANSETRON HCL 4 MG/2ML IJ SOLN
INTRAMUSCULAR | Status: DC | PRN
Start: 2016-06-21 — End: 2016-06-21
  Administered 2016-06-21: 40 mg via INTRAVENOUS

## 2016-06-21 MED ORDER — LIDOCAINE HCL (CARDIAC) 20 MG/ML IV SOLN
INTRAVENOUS | Status: DC | PRN
  Administered 2016-06-21: 60 mg via INTRAVENOUS

## 2016-06-21 MED ORDER — EPHEDRINE SULFATE 50 MG/ML IJ SOLN
INTRAMUSCULAR | Status: DC | PRN
  Administered 2016-06-21: 10 mg via INTRAVENOUS

## 2016-06-21 MED ORDER — FENTANYL CITRATE (PF) 100 MCG/2ML IJ SOLN
INTRAMUSCULAR | Status: AC
  Filled 2016-06-21: qty 2

## 2016-06-21 MED ORDER — TRAMADOL HCL 50 MG PO TABS: 50.0000 mg | ORAL_TABLET | Freq: Four times a day (QID) | ORAL | 0 refills | Status: DC | PRN

## 2016-06-21 MED ORDER — GABAPENTIN 300 MG PO CAPS
300.0000 mg | ORAL_CAPSULE | ORAL | Status: AC
  Administered 2016-06-21: 300 mg via ORAL

## 2016-06-21 MED ORDER — PROMETHAZINE HCL 25 MG/ML IJ SOLN: 6.2500 mg | INTRAMUSCULAR | Status: DC | PRN

## 2016-06-21 MED ORDER — DEXAMETHASONE SODIUM PHOSPHATE 4 MG/ML IJ SOLN
INTRAMUSCULAR | Status: DC | PRN
  Administered 2016-06-21: 10 mg via INTRAVENOUS

## 2016-06-21 MED ORDER — FENTANYL CITRATE (PF) 100 MCG/2ML IJ SOLN: 25.0000 ug | INTRAMUSCULAR | Status: DC | PRN

## 2016-06-21 MED ORDER — CELECOXIB 200 MG PO CAPS
ORAL_CAPSULE | ORAL | Status: AC
  Filled 2016-06-21: qty 2

## 2016-06-21 MED ORDER — SCOPOLAMINE 1 MG/3DAYS TD PT72: 1.0000 | MEDICATED_PATCH | Freq: Once | TRANSDERMAL | Status: DC | PRN

## 2016-06-21 MED ORDER — FENTANYL CITRATE (PF) 100 MCG/2ML IJ SOLN
50.0000 ug | INTRAMUSCULAR | Status: DC | PRN
  Administered 2016-06-21 (×2): 50 ug via INTRAVENOUS

## 2016-06-21 MED ORDER — CEFAZOLIN SODIUM-DEXTROSE 2-4 GM/100ML-% IV SOLN
2.0000 g | INTRAVENOUS | Status: AC
  Administered 2016-06-21: 2 g via INTRAVENOUS

## 2016-06-21 MED ORDER — BUPIVACAINE HCL (PF) 0.25 % IJ SOLN
INTRAMUSCULAR | Status: DC | PRN
  Administered 2016-06-21: 10 mL

## 2016-06-21 MED ORDER — CELECOXIB 400 MG PO CAPS
400.0000 mg | ORAL_CAPSULE | ORAL | Status: AC
  Administered 2016-06-21: 400 mg via ORAL

## 2016-06-21 MED ORDER — ACETAMINOPHEN 500 MG PO TABS
ORAL_TABLET | ORAL | Status: AC
  Filled 2016-06-21: qty 2

## 2016-06-21 MED ORDER — GABAPENTIN 300 MG PO CAPS
ORAL_CAPSULE | ORAL | Status: AC
  Filled 2016-06-21: qty 1

## 2016-06-21 MED ORDER — PROPOFOL 10 MG/ML IV BOLUS
INTRAVENOUS | Status: DC | PRN
  Administered 2016-06-21: 100 mg via INTRAVENOUS

## 2016-06-21 SURGICAL SUPPLY — 57 items
ADH SKN CLS APL DERMABOND .7 (GAUZE/BANDAGES/DRESSINGS) ×1
BINDER BREAST MEDIUM (GAUZE/BANDAGES/DRESSINGS) ×2 IMPLANT
BLADE SURG 15 STRL LF DISP TIS (BLADE) ×1 IMPLANT
BLADE SURG 15 STRL SS (BLADE) ×3
CANISTER SUC SOCK COL 7IN (MISCELLANEOUS) IMPLANT
CANISTER SUCT 1200ML W/VALVE (MISCELLANEOUS) IMPLANT
CHLORAPREP W/TINT 26ML (MISCELLANEOUS) ×3 IMPLANT
CLIP TI WIDE RED SMALL 6 (CLIP) ×2 IMPLANT
CLOSURE WOUND 1/2 X4 (GAUZE/BANDAGES/DRESSINGS) ×1
COVER BACK TABLE 60X90IN (DRAPES) ×3 IMPLANT
COVER MAYO STAND STRL (DRAPES) ×3 IMPLANT
COVER PROBE W GEL 5X96 (DRAPES) ×3 IMPLANT
DECANTER SPIKE VIAL GLASS SM (MISCELLANEOUS) IMPLANT
DERMABOND ADVANCED (GAUZE/BANDAGES/DRESSINGS) ×2
DERMABOND ADVANCED .7 DNX12 (GAUZE/BANDAGES/DRESSINGS) ×1 IMPLANT
DEVICE DUBIN W/COMP PLATE 8390 (MISCELLANEOUS) ×3 IMPLANT
DRAPE LAPAROSCOPIC ABDOMINAL (DRAPES) ×3 IMPLANT
DRAPE UTILITY XL STRL (DRAPES) ×3 IMPLANT
ELECT COATED BLADE 2.86 ST (ELECTRODE) ×3 IMPLANT
ELECT REM PT RETURN 9FT ADLT (ELECTROSURGICAL) ×3
ELECTRODE REM PT RTRN 9FT ADLT (ELECTROSURGICAL) ×1 IMPLANT
GLOVE BIO SURGEON STRL SZ7 (GLOVE) ×6 IMPLANT
GLOVE BIOGEL PI IND STRL 6.5 (GLOVE) IMPLANT
GLOVE BIOGEL PI IND STRL 7.5 (GLOVE) ×1 IMPLANT
GLOVE BIOGEL PI INDICATOR 6.5 (GLOVE) ×4
GLOVE BIOGEL PI INDICATOR 7.5 (GLOVE) ×4
GLOVE SURG SS PI 6.0 STRL IVOR (GLOVE) ×2 IMPLANT
GLOVE SURG SYN 7.5  E (GLOVE) ×2
GLOVE SURG SYN 7.5 E (GLOVE) ×1 IMPLANT
GLOVE SURG SYN 7.5 PF PI (GLOVE) IMPLANT
GOWN STRL REUS W/ TWL LRG LVL3 (GOWN DISPOSABLE) ×2 IMPLANT
GOWN STRL REUS W/TWL LRG LVL3 (GOWN DISPOSABLE) ×6
HEMOSTAT ARISTA ABSORB 3G PWDR (MISCELLANEOUS) IMPLANT
ILLUMINATOR WAVEGUIDE N/F (MISCELLANEOUS) ×2 IMPLANT
KIT MARKER MARGIN INK (KITS) ×3 IMPLANT
LIGHT WAVEGUIDE WIDE FLAT (MISCELLANEOUS) IMPLANT
NDL HYPO 25X1 1.5 SAFETY (NEEDLE) ×1 IMPLANT
NEEDLE HYPO 25X1 1.5 SAFETY (NEEDLE) ×3 IMPLANT
NS IRRIG 1000ML POUR BTL (IV SOLUTION) IMPLANT
PACK BASIN DAY SURGERY FS (CUSTOM PROCEDURE TRAY) ×3 IMPLANT
PENCIL BUTTON HOLSTER BLD 10FT (ELECTRODE) ×3 IMPLANT
SLEEVE SCD COMPRESS KNEE MED (MISCELLANEOUS) ×3 IMPLANT
SPONGE LAP 4X18 X RAY DECT (DISPOSABLE) ×3 IMPLANT
STRIP CLOSURE SKIN 1/2X4 (GAUZE/BANDAGES/DRESSINGS) ×2 IMPLANT
SUT MNCRL AB 4-0 PS2 18 (SUTURE) IMPLANT
SUT MON AB 5-0 PS2 18 (SUTURE) IMPLANT
SUT SILK 2 0 SH (SUTURE) IMPLANT
SUT VIC AB 2-0 SH 27 (SUTURE) ×3
SUT VIC AB 2-0 SH 27XBRD (SUTURE) ×1 IMPLANT
SUT VIC AB 3-0 SH 27 (SUTURE) ×3
SUT VIC AB 3-0 SH 27X BRD (SUTURE) ×1 IMPLANT
SYR CONTROL 10ML LL (SYRINGE) ×3 IMPLANT
TOWEL OR 17X24 6PK STRL BLUE (TOWEL DISPOSABLE) ×3 IMPLANT
TOWEL OR NON WOVEN STRL DISP B (DISPOSABLE) ×1 IMPLANT
TUBE CONNECTING 20'X1/4 (TUBING)
TUBE CONNECTING 20X1/4 (TUBING) IMPLANT
YANKAUER SUCT BULB TIP NO VENT (SUCTIONS) IMPLANT

## 2016-06-21 NOTE — Discharge Instructions (Signed)
[facility] °Office Phone Number [phone] ° ° POST OP INSTRUCTIONS ° °Always review your discharge instruction sheet given to you by the facility where your surgery was performed. ° °IF YOU HAVE DISABILITY OR FAMILY LEAVE FORMS, YOU MUST BRING THEM TO THE OFFICE FOR PROCESSING.  DO NOT GIVE THEM TO YOUR DOCTOR. ° °1. A prescription for pain medication may be given to you upon discharge.  Take your pain medication as prescribed, if needed.  If narcotic pain medicine is not needed, then you may take acetaminophen (Tylenol), naprosyn (Alleve) or ibuprofen (Advil) as needed. °2. Take your usually prescribed medications unless otherwise directed °3. If you need a refill on your pain medication, please contact your pharmacy.  They will contact our office to request authorization.  Prescriptions will not be filled after 5pm or on week-ends. °4. You should eat very light the first 24 hours after surgery, such as soup, crackers, pudding, etc.  Resume your normal diet the day after surgery. °5. Most patients will experience some swelling and bruising in the breast.  Ice packs and a good support bra will help.  Wear the breast binder provided or a sports bra for 72 hours day and night.  After that wear a sports bra during the day until you return to the office. Swelling and bruising can take several days to resolve.  °6. It is common to experience some constipation if taking pain medication after surgery.  Increasing fluid intake and taking a stool softener will usually help or prevent this problem from occurring.  A mild laxative (Milk of Magnesia or Miralax) should be taken according to package directions if there are no bowel movements after 48 hours. °7. Unless discharge instructions indicate otherwise, you may remove your bandages 48 hours after surgery and you may shower at that time.  You may have steri-strips (small skin tapes) in place directly over the incision.  These strips should be left on the  skin for 7-10 days and will come off on their own.  If your surgeon used skin glue on the incision, you may shower in 24 hours.  The glue will flake off over the next 2-3 weeks.  Any sutures or staples will be removed at the office during your follow-up visit. °8. ACTIVITIES:  You may resume regular daily activities (gradually increasing) beginning the next day.  Wearing a good support bra or sports bra minimizes pain and swelling.  You may have sexual intercourse when it is comfortable. °a. You may drive when you no longer are taking prescription pain medication, you can comfortably wear a seatbelt, and you can safely maneuver your car and apply brakes. °b. RETURN TO WORK:  ______________________________________________________________________________________ °9. You should see your doctor in the office for a follow-up appointment approximately two weeks after your surgery.  Your doctor’s nurse will typically make your follow-up appointment when she calls you with your pathology report.  Expect your pathology report 3-4 business days after your surgery.  You may call to check if you do not hear from us after three days. °10. OTHER INSTRUCTIONS: _______________________________________________________________________________________________ _____________________________________________________________________________________________________________________________________ °_____________________________________________________________________________________________________________________________________ °_____________________________________________________________________________________________________________________________________ ° °WHEN TO CALL DR WAKEFIELD: °1. Fever over 101.0 °2. Nausea and/or vomiting. °3. Extreme swelling or bruising. °4. Continued bleeding from incision. °5. Increased pain, redness, or drainage from the incision. ° °The clinic staff is available to answer your questions during regular  business hours.  Please don’t hesitate to call and ask to speak to one of the nurses for clinical concerns.    If you have a medical emergency, go to the nearest emergency room or call 911.  A surgeon from [facility] is always on call at the hospital. ° °For further questions, please visit [email or web address]  ° ° °Post Anesthesia Home Care Instructions ° °Activity: °Get plenty of rest for the remainder of the day. A responsible adult should stay with you for 24 hours following the procedure.  °For the next 24 hours, DO NOT: °-Drive a car °-Operate machinery °-Drink alcoholic beverages °-Take any medication unless instructed by your physician °-Make any legal decisions or sign important papers. ° °Meals: °Start with liquid foods such as gelatin or soup. Progress to regular foods as tolerated. Avoid greasy, spicy, heavy foods. If nausea and/or vomiting occur, drink only clear liquids until the nausea and/or vomiting subsides. Call your physician if vomiting continues. ° °Special Instructions/Symptoms: °Your throat may feel dry or sore from the anesthesia or the breathing tube placed in your throat during surgery. If this causes discomfort, gargle with warm salt water. The discomfort should disappear within 24 hours. ° °If you had a scopolamine patch placed behind your ear for the management of post- operative nausea and/or vomiting: ° °1. The medication in the patch is effective for 72 hours, after which it should be removed.  Wrap patch in a tissue and discard in the trash. Wash hands thoroughly with soap and water. °2. You may remove the patch earlier than 72 hours if you experience unpleasant side effects which may include dry mouth, dizziness or visual disturbances. °3. Avoid touching the patch. Wash your hands with soap and water after contact with the patch. °  °Call your surgeon if you experience:  ° °1.  Fever over 101.0. °2.  Inability to urinate. °3.  Nausea and/or vomiting. °4.   Extreme swelling or bruising at the surgical site. °5.  Continued bleeding from the incision. °6.  Increased pain, redness or drainage from the incision. °7.  Problems related to your pain medication. °8.  Any problems and/or concerns ° °

## 2016-06-21 NOTE — H&P (Signed)
41 yof referred by Dr Howard Pouch for new left breast mass. she has personal history of bilateral cyst aspiration and her mom had ovarian cancer at early age. she noted a left breast mass that underwent evaluation. the mass was a small cyst that has now disappeared but there were 4 mm of calcifications next to that. her density is d. this underwent biopsy and is alh. she has no mass or discharge now. she is here to discuss options   Past Surgical History Dominique Taylor, [place]; 06/09/2016 9:00 AM) Breast Augmentation  Bilateral. Breast Biopsy  Bilateral. multiple  Diagnostic Studies History Dominique Taylor, [place]; 06/09/2016 9:00 AM) Colonoscopy  never Mammogram  within last year Pap Smear  1-5 years ago  Allergies Dominique Taylor, CMA; 06/09/2016 9:02 AM) Wellbutrin *ANTIDEPRESSANTS*  Itching Allergies Reconciled   Medication History Dominique Taylor, CMA; 06/09/2016 9:02 AM) Escitalopram Oxalate (20MG  Tablet, Oral daily) Active. ProAir HFA (108 (90 Base)MCG/ACT Aerosol Soln, Inhalation as needed) Active. Prenatal Multivit-Min-Fe-FA (0.1MG  Capsule, Oral daily) Active. Medications Reconciled  Social History Dominique Taylor, [place]; 06/09/2016 9:00 AM) Alcohol use  Occasional alcohol use. Caffeine use  Coffee, Tea. No drug use  Tobacco use  Current every day smoker.  Family History Dominique Taylor, [place]; 06/09/2016 9:00 AM) Arthritis  Mother. Cervical Cancer  Mother. Colon Polyps  Father, Son. Diabetes Mellitus  Father. Hypertension  Brother, Father. Ovarian Cancer  Mother. Thyroid problems  Father.  Pregnancy / Birth History Dominique Taylor, [place]; 06/09/2016 9:00 AM) Age at menarche  77 years. Gravida  2 Maternal age  50-20 Para  2 Regular periods   Other Problems Dominique Taylor, [place]; 06/09/2016 9:00 AM) Lump In Breast     Review of Systems ([name]; 06/09/2016 9:00 AM) General Not Present- Appetite Loss, Chills, Fatigue, Fever, Night Sweats,  Weight Gain and Weight Loss. Skin Not Present- Change in Wart/Mole, Dryness, Hives, Jaundice, New Lesions, Non-Healing Wounds, Rash and Ulcer. HEENT Not Present- Earache, Hearing Loss, Hoarseness, Nose Bleed, Oral Ulcers, Ringing in the Ears, Seasonal Allergies, Sinus Pain, Sore Throat, Visual Disturbances, Wears glasses/contact lenses and Yellow Eyes. Respiratory Not Present- Bloody sputum, Chronic Cough, Difficulty Breathing, Snoring and Wheezing. Breast Present- Breast Mass and Nipple Discharge. Not Present- Breast Pain and Skin Changes. Cardiovascular Not Present- Chest Pain, Difficulty Breathing Lying Down, Leg Cramps, Palpitations, Rapid Heart Rate, Shortness of Breath and Swelling of Extremities. Gastrointestinal Not Present- Abdominal Pain, Bloating, Bloody Stool, Change in Bowel Habits, Chronic diarrhea, Constipation, Difficulty Swallowing, Excessive gas, Gets full quickly at meals, Hemorrhoids, Indigestion, Nausea, Rectal Pain and Vomiting. Female Genitourinary Not Present- Frequency, Nocturia, Painful Urination, Pelvic Pain and Urgency. Musculoskeletal Not Present- Back Pain, Joint Pain, Joint Stiffness, Muscle Pain, Muscle Weakness and Swelling of Extremities. Neurological Not Present- Decreased Memory, Fainting, Headaches, Numbness, Seizures, Tingling, Tremor, Trouble walking and Weakness. Psychiatric Not Present- Anxiety, Bipolar, Change in Sleep Pattern, Depression, Fearful and Frequent crying. Endocrine Not Present- Cold Intolerance, Excessive Hunger, Hair Changes, Heat Intolerance, Hot flashes and New Diabetes. Hematology Not Present- Blood Thinners, Easy Bruising, Excessive bleeding, Gland problems, HIV and Persistent Infections.  Vitals [name]; 06/09/2016 9:02 AM) 06/09/2016 9:02 AM Weight: 107.6 lb Height: 63in Body Surface Area: 1.49 m Body Mass Index: 19.06 kg/m  Temp.: 98.8F  Pulse: 92 (Regular)  BP: 102/72 (Sitting, Left Arm,  Standard)       Physical Exam [name]; 06/09/2016 9:39 AM) General Mental Status-Alert. Orientation-Oriented X3.  Chest and Lung Exam Chest and lung exam reveals -on auscultation,  normal breath sounds, no adventitious sounds and normal vocal resonance.  Breast Nipples-No Discharge. Note: implants in place small hematoma at uiq of left breast   Cardiovascular Cardiovascular examination reveals -normal heart sounds, regular rate and rhythm with no murmurs.  Lymphatic Head & Neck  General Head & Neck Lymphatics: Bilateral - Description - Normal. Axillary  General Axillary Region: Bilateral - Description - Normal. Note: no  adenopathy     Assessment & Plan [name]; 06/09/2016 9:48 AM) ATYPICAL LOBULAR HYPERPLASIA (ALH) OF LEFT BREAST (N60.92) Story: left radioactive seed excisional biopsy we discussed options and observation. I recommended excisional biopsy due to high risk of upgrade with this. we discussed seed guided excisional biopsy of this area with risks/benefits. once path comes back from this will discuss options for risk reduction/ following her/genetics

## 2016-06-21 NOTE — Op Note (Signed)
Preoperative diagnoses: Left breast mammographic lesion with core biopsy c/w alh Postoperative diagnosis: Same as above Procedure:Left breast seed guided excisional biopsy Surgeon: Dr. Serita Grammes Anesthesia: Gen. Estimated blood loss: minimal Complications: None Drains: None Specimens:Left breast tissue with paint, seed separate Sponge and needle count correct at completion Disposition to recovery stable  Indications: This is a 41yof who underwent mm with abnormality that underwent core biopsy that was alh.  We discussed excisional biopsy using seed guidance. She had seed placed prior to beginning and the mammogramswere in the room.   Procedure: After informed consent was obtained she was then taken to the operating room. She was given cefazolin. Sequential compression devices were on her legs. She was placed under general anesthesia without complication. Her leftbreast was then prepped and draped in the standard sterile surgical fashion. A surgical timeout was then performed.  I located the radioactive seed with the neoprobe. I infiltrated marcaine in the area of the seed as well as around the areola. I made an incision on areolar borderin an effort to hide her scar. I tunneled to the lesion with the lighted retractors. I then used the neoprobe to guide the excision of the seed and surrounding tissue. The seed was implanted in the dermis. I removed this going very close to the skin with cautery as I could not use scissorsThis was confirmed by the neoprobe. This was then taken for mammogram which confirmed removal of the seed and the clip separately.  This was confirmed by radiology. This was then sent to pathology. Hemostasis was observed.I closed the breast tissue with a 2-0 Vicryl. The dermis was closed with 3-0 Vicryl and the skin with 5-0 Monocryl.Dermabond and steristrips were placed on the incision. A breast binder was placed. She was transferred to recovery stable

## 2016-06-21 NOTE — Transfer of Care (Signed)
Immediate Anesthesia Transfer of Care Note  Patient: Dominique Taylor  Procedure(s) Performed: Procedure(s): LEFT BREAST RADIOACTIVE SEED GUIDED EXCISIONAL BIOPSY (Left)  Patient Location: PACU  Anesthesia Type:General  Level of Consciousness: awake, alert , oriented and patient cooperative  Airway & Oxygen Therapy: Patient Spontanous Breathing and Patient connected to face mask oxygen  Post-op Assessment: Report given to RN and Post -op Vital signs reviewed and stable  Post vital signs: Reviewed and stable  Last Vitals:  Vitals:   06/21/16 0852  BP: 104/68  Pulse: 72  Resp: 18  Temp: 36.7 C    Last Pain:  Vitals:   06/21/16 0852  TempSrc: Oral         Complications: No apparent anesthesia complications

## 2016-06-21 NOTE — Anesthesia Preprocedure Evaluation (Addendum)
Anesthesia Evaluation  Patient identified by MRN, date of birth, ID band Patient awake    Reviewed: Allergy & Precautions, NPO status , Patient's Chart, lab work & pertinent test results  Airway Mallampati: I  TM Distance: >3 FB Neck ROM: Full    Dental  (+) Teeth Intact, Dental Advisory Given   Pulmonary Current Smoker (1 PPD),    Pulmonary exam normal breath sounds clear to auscultation       Cardiovascular Exercise Tolerance: Good negative cardio ROS Normal cardiovascular exam Rhythm:Regular Rate:Normal     Neuro/Psych PSYCHIATRIC DISORDERS Depression negative neurological ROS     GI/Hepatic negative GI ROS, Neg liver ROS,   Endo/Other  negative endocrine ROS  Renal/GU negative Renal ROS     Musculoskeletal negative musculoskeletal ROS (+)   Abdominal   Peds  Hematology negative hematology ROS (+)   Anesthesia Other Findings Day of surgery medications reviewed with the patient.  Left breast cancer   Reproductive/Obstetrics                           Anesthesia Physical Anesthesia Plan  ASA: II  Anesthesia Plan: General   Post-op Pain Management:    Induction: Intravenous  Airway Management Planned: LMA  Additional Equipment:   Intra-op Plan:   Post-operative Plan: Extubation in OR  Informed Consent: I have reviewed the patients History and Physical, chart, labs and discussed the procedure including the risks, benefits and alternatives for the proposed anesthesia with the patient or authorized representative who has indicated his/her understanding and acceptance.   Dental advisory given  Plan Discussed with: CRNA  Anesthesia Plan Comments: (Risks/benefits of general anesthesia discussed with patient including risk of damage to teeth, lips, gum, and tongue, nausea/vomiting, allergic reactions to medications, and the possibility of heart attack, stroke and death.  All  patient questions answered.  Patient wishes to proceed.)        Anesthesia Quick Evaluation

## 2016-06-21 NOTE — Interval H&P Note (Signed)
History and Physical Interval Note:  06/21/2016 9:55 AM  Dominique Taylor  has presented today for surgery, with the diagnosis of left breast mass  The various methods of treatment have been discussed with the patient and family. After consideration of risks, benefits and other options for treatment, the patient has consented to  Procedure(s): LEFT BREAST RADIOACTIVE SEED GUIDED EXCISIONAL BIOPSY (Left) as a surgical intervention .  The patient's history has been reviewed, patient examined, no change in status, stable for surgery.  I have reviewed the patient's chart and labs.  Questions were answered to the patient's satisfaction.     Arloa Prak

## 2016-06-21 NOTE — Anesthesia Postprocedure Evaluation (Signed)
Anesthesia Post Note  Patient: Dominique Taylor  Procedure(s) Performed: Procedure(s) (LRB): LEFT BREAST RADIOACTIVE SEED GUIDED EXCISIONAL BIOPSY (Left)  Patient location during evaluation: PACU Anesthesia Type: General Level of consciousness: awake and alert Pain management: pain level controlled Vital Signs Assessment: post-procedure vital signs reviewed and stable Respiratory status: spontaneous breathing, nonlabored ventilation, respiratory function stable and patient connected to nasal cannula oxygen Cardiovascular status: blood pressure returned to baseline and stable Postop Assessment: no signs of nausea or vomiting Anesthetic complications: no       Last Vitals:  Vitals:   06/21/16 1115 06/21/16 1145  BP: 113/75 117/64  Pulse: 95 99  Resp: 18 18  Temp:  36.9 C    Last Pain:  Vitals:   06/21/16 1145  TempSrc:   PainSc: 0-No pain                 Catalina Gravel

## 2016-06-21 NOTE — Anesthesia Procedure Notes (Signed)
Procedure Name: LMA Insertion Date/Time: 06/21/2016 10:09 AM Performed by: Mekhia Brogan D Pre-anesthesia Checklist: Patient identified, Emergency Drugs available, Suction available and Patient being monitored Patient Re-evaluated:Patient Re-evaluated prior to inductionOxygen Delivery Method: Circle system utilized Preoxygenation: Pre-oxygenation with 100% oxygen Intubation Type: IV induction Ventilation: Mask ventilation without difficulty LMA: LMA inserted LMA Size: 3.0 Number of attempts: 1 Airway Equipment and Method: Bite block Placement Confirmation: positive ETCO2 Tube secured with: Tape Dental Injury: Teeth and Oropharynx as per pre-operative assessment

## 2016-06-22 ENCOUNTER — Encounter (HOSPITAL_BASED_OUTPATIENT_CLINIC_OR_DEPARTMENT_OTHER): Payer: Self-pay | Admitting: General Surgery

## 2016-06-29 ENCOUNTER — Encounter: Payer: Self-pay | Admitting: Family Medicine

## 2016-06-30 ENCOUNTER — Other Ambulatory Visit: Payer: Self-pay | Admitting: *Deleted

## 2016-06-30 DIAGNOSIS — F418 Other specified anxiety disorders: Secondary | ICD-10-CM

## 2016-06-30 MED ORDER — ESCITALOPRAM OXALATE 20 MG PO TABS: 20.0000 mg | ORAL_TABLET | Freq: Every day | ORAL | 0 refills | Status: DC

## 2016-06-30 NOTE — Telephone Encounter (Signed)
lexapro refilled for 30 day supply . Patient needs office visit prior to anymore refills.

## 2016-07-07 ENCOUNTER — Encounter: Payer: Self-pay | Admitting: Hematology

## 2016-07-07 ENCOUNTER — Telehealth: Payer: Self-pay | Admitting: Hematology

## 2016-07-07 NOTE — Telephone Encounter (Signed)
Pt cld to schedule an appt. Appt has been scheduled for the pt to see Dr. Burr Medico on 3/16 at [facility] to arrive 30 minutes early. Letter mailed.

## 2016-07-11 ENCOUNTER — Ambulatory Visit (INDEPENDENT_AMBULATORY_CARE_PROVIDER_SITE_OTHER): Payer: Managed Care, Other (non HMO) | Admitting: Family Medicine

## 2016-07-11 ENCOUNTER — Encounter: Payer: Self-pay | Admitting: Family Medicine

## 2016-07-11 VITALS — BP 117/76 | HR 75 | Temp 97.7°F | Resp 20 | Ht 63.0 in | Wt 110.8 lb

## 2016-07-11 DIAGNOSIS — F17200 Nicotine dependence, unspecified, uncomplicated: Secondary | ICD-10-CM

## 2016-07-11 DIAGNOSIS — Z716 Tobacco abuse counseling: Secondary | ICD-10-CM | POA: Diagnosis not present

## 2016-07-11 DIAGNOSIS — F418 Other specified anxiety disorders: Secondary | ICD-10-CM

## 2016-07-11 DIAGNOSIS — Z720 Tobacco use: Secondary | ICD-10-CM | POA: Diagnosis not present

## 2016-07-11 MED ORDER — ESCITALOPRAM OXALATE 20 MG PO TABS: 20.0000 mg | ORAL_TABLET | Freq: Every day | ORAL | 1 refills | Status: DC

## 2016-07-11 MED ORDER — NICOTINE 21 MG/24HR TD PT24: 21.0000 mg | MEDICATED_PATCH | Freq: Every day | TRANSDERMAL | 0 refills | Status: DC

## 2016-07-11 MED ORDER — NICOTINE POLACRILEX 2 MG MT GUM: 2.0000 mg | CHEWING_GUM | OROMUCOSAL | 0 refills | Status: DC | PRN

## 2016-07-11 NOTE — Patient Instructions (Signed)
Followup every 6  Months on this issue. Refills provided today.  I will be thinking about you in March and sending positive vibes your way.

## 2016-07-11 NOTE — Progress Notes (Signed)
Dominique Taylor , 10/14/1975, 41 y.o., female MRN: [id number] Patient Care Team    Relationship Specialty Notifications Start End  Ma Hillock, DO PCP - General Family Medicine  02/29/16     CC: depression and anxiety Subjective:   depression and anxiety: Pt is doing well on lexapro 20 mg QD. She has recently Been undergoing breast biopsies and testing secondary to abnormal mammogram, after palpated mass in her left breast. She states this has caused her more anxiety, but she still feels like she is doing okay on the Lexapro 20 mg daily. She finds out more information after seeing an oncologist in March. Prior note:   pt presents for follow up on depression and anxiety today. She started lexapro 10 mg QD about 4 weeks ago. She is tolerating medication without side effects and feels it is helping her. She feels more in control of her emotions and is less irritable. She does feel it "wears off" mid day. She denies continued neck pain.   Pt presents for an acute OV with complaints of neck pain of 1.5 weeks duration.  Associated symptoms include increased stress at work and in her home life. The pain is located mid neck over the bone area and muscles. She has not had neck pain like this before. She denies fever, chills, headache, radiation of pain, numbness or tingling in her extremities. She denies injury or arthritis. She states the pain is not constant and is more aggreviating, she thinks it is probably from her stress. She reports nothing seems to make the pain worse or better. She has not tried any OTC therapy or heat. She states her stress level is more than she can handle lately. She states the office environment is negative and there is a great deal of animosity there. Her home life is overwhelming with her step daughter. Her husband has a 80 year old daughter that is very complicated and is in trouble a great deal. It has started to cause problems in her relationship and life. She  reports she has two adult children of her own (92 and 68) and now a new grand-baby. She reports the grand-baby is only thing that gives her any joy any longer. She has never been on a medication for depression or anxiety. She was tried on bupropion in the past for smoking cessation and was allergic.   Smoking cessation: Patient states she still has a chronic cough, that she is aware is likely from her smoking. She does have interest in quitting smoking, however she doesn't know what the extent that since neither of the Chantix or the Zyban had worked for her. She has never used nicotine patches or gum in the past. She currently smokes approximately one pack of cigarettes per day.  Mood disorder: negative Depression screen PHQ 2/9 02/29/2016  Decreased Interest 1  Down, Depressed, Hopeless 3  PHQ - 2 Score 4  Altered sleeping 1  Tired, decreased energy 2  Change in appetite 2  Feeling bad or failure about yourself  3  Trouble concentrating 3  Moving slowly or fidgety/restless 3  Suicidal thoughts 1  PHQ-9 Score 19   GAD 7 : Generalized Anxiety Score 02/29/2016  Nervous, Anxious, on Edge 3  Control/stop worrying 3  Worry too much - different things 2  Trouble relaxing 2  Restless 1  Easily annoyed or irritable 3  Afraid - awful might happen 2  Total GAD 7 Score 16     Allergies  Allergen Reactions  . Wellbutrin [Bupropion] Itching   Social History  Substance Use Topics  . Smoking status: Current Every Day Smoker    Packs/day: 1.00    Years: 23.00    Types: Cigarettes  . Smokeless tobacco: Never Used  . Alcohol use No   Past Medical History:  Diagnosis Date  . Abnormal Pap smear   . Breast mass, left   . Cancer ([place])    ovarian cervical ca  . Depression   . HPV (human papilloma virus) infection    Past Surgical History:  Procedure Laterality Date  . BREAST ENHANCEMENT SURGERY    . COLPOSCOPY    . RADIOACTIVE SEED GUIDED EXCISIONAL BREAST BIOPSY Left 06/21/2016    Procedure: LEFT BREAST RADIOACTIVE SEED GUIDED EXCISIONAL BIOPSY;  Surgeon: Rolm Bookbinder, MD;  Location: [facility];  Service: General;  Laterality: Left;  . TUBAL LIGATION     Family History  Problem Relation Age of Onset  . Arthritis Mother     RA  . Ovarian cancer Mother   . Depression Father   . Hypertension Father   . Diabetes Father   . Ulcerative colitis Father   . Hypertension Brother    Allergies as of 07/11/2016      Reactions   Wellbutrin [bupropion] Itching      Medication List       Accurate as of 07/11/16  4:05 PM. Always use your most recent med list.          albuterol 108 (90 Base) MCG/ACT inhaler Commonly known as:  PROVENTIL HFA;VENTOLIN HFA Inhale 2 puffs into the lungs every 6 (six) hours as needed for wheezing.   escitalopram 20 MG tablet Commonly known as:  LEXAPRO Take 1 tablet (20 mg total) by mouth daily. Needs office visit prior to anymore refills.   prenatal multivitamin Tabs tablet Take 1 tablet by mouth daily at 12 noon.       No results found for this or any previous visit (from the past 24 hour(s)). No results found.   ROS: Negative, with the exception of above mentioned in HPI   Objective:  BP 117/76 (BP Location: Right Arm, Patient Position: Sitting, Cuff Size: Normal)   Pulse 75   Temp 97.7 F (36.5 C)   Resp 20   Ht 5\' 3"  (1.6 m)   Wt 110 lb 12 oz (50.2 kg)   SpO2 97%   BMI 19.62 kg/m  Body mass index is 19.62 kg/m. Gen: Afebrile. No acute distress.  HENT: AT. [place]. MMM.  Eyes:Pupils Equal Round Reactive to light, Extraocular movements intact,  Conjunctiva without redness, discharge or icterus. CV: RRR  Chest: CTAB, no wheeze or crackles Psych: Normal affect, dress and demeanor. Normal speech. Normal thought content and judgment.   Assessment/Plan: Dominique Taylor is a 41 y.o. female present for OV for  Depression with anxiety - Continue Lexapro 20 mg daily, refills provided today. - As  long as still stable, can follow-up every 6 months.  Tobacco abuse/Encounter for smoking cessation counseling - briefly discussed cessation, >3 min - Patient to circle quit date on her calendar, throw out all cigarettes the night prior. For going to bed that evening, she is to place the first nicotine patch on her skin. She is doing well, and wants to continue the patches 2 weeks after starting, she is to call in and would decrease dose at that time for additional 2 weeks. - nicotine (NICODERM CQ - DOSED IN MG/24  HOURS) 21 mg/24hr patch; Place 1 patch (21 mg total) onto the skin daily.  Dispense: 14 patch; Refill: 0 - nicotine polacrilex (NICORETTE) 2 MG gum; Take 1 each (2 mg total) by mouth as needed for smoking cessation.  Dispense: 100 tablet; Refill: 0 - Follow-up 4 weeks if needing additional cessation. If continuing just patches, would be happy to call in dose taper as needed.  electronically signed by:  Howard Pouch, DO  [place]

## 2016-07-25 NOTE — Progress Notes (Signed)
[place]  Telephone:(336) [phone] Fax:(336) [place] Note   Patient Care Team: Dominique Hillock, DO as PCP - General (Family Medicine) 07/29/2016  Referring physician: Dr. Donne Taylor  CHIEF COMPLAINTS/PURPOSE OF CONSULTATION:  Newly diagnosed Dominique Taylor of the left breast  HISTORY OF PRESENTING ILLNESS (07/29/2016):  Dominique Taylor 41 y.o. female is here because of atypical lobular hyperplasia of the left breast. Pt underwent a diagnostic mammogram on 05/20/2016, which showed calcifications that could be concerning for malignancy. A biopsy was performed on 05/26/2016, which showed ALH. She underwent a lumpectomy on 06/21/2016 and presents to the cancer center for further cancer prevention.   She presents with her mother today. She found a lump on her breast, which is why she went to get a mammogram. She first noticed this lump May 04, 2016. She did notice some nipple discharge, no color. Denies pain. She did well after surgery. Denies swelling. When she's sick she has a lot of SOB, for which she uses an inhaler for. Denies loss of appetite, or any other concerns.   No history of medical problems. She takes Lexapro for anxiety. She still has regular periods. She had a tubal ligation in the past. Her mother had cervical cancer in the past, at the age of 52. Her father has a history of DM and HTN. Denies any other family history of cancer. She smokes 1 ppd, for the past 23 years. She is trying to quit; currently using nicotine patches. Occasional drinker.   GYN HISTORY  Menarchal: age 71 LMP: regular  Contraceptive: Tubal Ligation HRT: n/a  G2P2: One daughter and one son, 74 and 43 yo  MEDICAL HISTORY:  Past Medical History:  Diagnosis Date  . Abnormal Pap smear   . Breast mass, left   . Cancer ([place])    ovarian cervical ca  . Depression   . HPV (human papilloma virus) infection     SURGICAL HISTORY: Past Surgical History:  Procedure Laterality  Date  . BREAST ENHANCEMENT SURGERY    . COLPOSCOPY    . RADIOACTIVE SEED GUIDED EXCISIONAL BREAST BIOPSY Left 06/21/2016   Procedure: LEFT BREAST RADIOACTIVE SEED GUIDED EXCISIONAL BIOPSY;  Surgeon: Dominique Bookbinder, MD;  Location: [facility];  Service: General;  Laterality: Left;  . TUBAL LIGATION      SOCIAL HISTORY: Social History   Social History  . Marital status: Married    Spouse name: N/A  . Number of children: 2  . Years of education: N/A   Occupational History  . Not on file.   Social History Main Topics  . Smoking status: Current Every Day Smoker    Packs/day: 1.00    Years: 23.00    Types: Cigarettes  . Smokeless tobacco: Never Used  . Alcohol use No  . Drug use: No  . Sexual activity: Yes   Other Topics Concern  . Not on file   Social History Narrative   Ms Dominique Taylor lives w/her husband & 2 grown children. She works Medical laboratory scientific officer.    FAMILY HISTORY: Family History  Problem Relation Age of Onset  . Arthritis Mother     RA  . Cancer Mother 13    cervical cancer   . Depression Father   . Hypertension Father   . Diabetes Father   . Ulcerative colitis Father   . Hypertension Brother     ALLERGIES:  is allergic to wellbutrin [bupropion].  MEDICATIONS:  Current Outpatient Prescriptions  Medication Sig Dispense  Refill  . albuterol (PROVENTIL HFA;VENTOLIN HFA) 108 (90 BASE) MCG/ACT inhaler Inhale 2 puffs into the lungs every 6 (six) hours as needed for wheezing. 1 Inhaler 1  . escitalopram (LEXAPRO) 20 MG tablet Take 1 tablet (20 mg total) by mouth daily. Needs office visit prior to anymore refills. 90 tablet 1  . nicotine (NICODERM CQ - DOSED IN MG/24 HOURS) 21 mg/24hr patch Place 1 patch (21 mg total) onto the skin daily. 14 patch 0  . nicotine polacrilex (NICORETTE) 2 MG gum Take 1 each (2 mg total) by mouth as needed for smoking cessation. 100 tablet 0  . Prenatal Vit-Fe Fumarate-FA (PRENATAL MULTIVITAMIN) TABS tablet Take 1 tablet by mouth daily  at 12 noon.    . tamoxifen (NOLVADEX) 20 MG tablet Take 1 tablet (20 mg total) by mouth daily. 30 tablet 2   No current facility-administered medications for this visit.     REVIEW OF SYSTEMS:   Constitutional: Denies fevers, chills or abnormal night sweats Eyes: Denies blurriness of vision, double vision or watery eyes Ears, nose, mouth, throat, and face: Denies mucositis or sore throat Respiratory: Denies cough, dyspnea or wheezes (+) SOB Cardiovascular: Denies palpitation, chest discomfort or lower extremity swelling Gastrointestinal:  Denies nausea, heartburn or change in bowel habits Skin: Denies abnormal skin rashes Lymphatics: Denies new lymphadenopathy or easy bruising Neurological:Denies numbness, tingling or new weaknesses Behavioral/Psych: Mood is stable, no new changes  All other systems were reviewed with the patient and are negative.  PHYSICAL EXAMINATION: ECOG PERFORMANCE STATUS: 0 - Asymptomatic  Vitals:   07/29/16 1116  BP: 120/66  Pulse: 77  Resp: 16  Temp: 97.7 F (36.5 C)   Filed Weights   07/29/16 1116  Weight: 112 lb 14.4 oz (51.2 kg)   GENERAL:alert, no distress and comfortable SKIN: skin color, texture, turgor are normal, no rashes or significant lesions EYES: normal, conjunctiva are pink and non-injected, sclera clear OROPHARYNX:no exudate, no erythema and lips, buccal mucosa, and tongue normal  NECK: supple, thyroid normal size, non-tender, without nodularity LYMPH:  no palpable lymphadenopathy in the cervical, axillary or inguinal LUNGS: clear to auscultation and percussion with normal breathing effort HEART: regular rate & rhythm and no murmurs and no lower extremity edema ABDOMEN:abdomen soft, non-tender and normal bowel sounds Musculoskeletal:no cyanosis of digits and no clubbing  PSYCH: alert & oriented x 3 with fluent speech NEURO: no focal motor/sensory deficits BREASTS: Breast inspection showed them to be symmetrical with no nipple  discharge. Palpation of the breasts and axilla revealed no obvious mass that I could appreciate. (+) L breast incision site well healed (+) b/l implant  LABORATORY DATA:  I have reviewed the data as listed CBC Latest Ref Rng & Units 07/29/2016 02/24/2015 06/29/2014  WBC 3.9 - 10.3 10e3/uL 8.5 8.6 8.8  Hemoglobin 11.6 - 15.9 g/dL 13.3 14.3 14.1  Hematocrit 34.8 - 46.6 % 39.6 41.5 40.4  Platelets 145 - 400 10e3/uL 250 282 252   CMP Latest Ref Rng & Units 07/29/2016 06/29/2014  Glucose 70 - 140 mg/dl 92 113(H)  BUN 7.0 - 26.0 mg/dL 20.8 6  Creatinine 0.6 - 1.1 mg/dL 0.8 0.58  Sodium 136 - 145 mEq/L 139 136  Potassium 3.5 - 5.1 mEq/L 4.2 3.7  Chloride 96 - 112 mmol/L - 102  CO2 22 - 29 mEq/L 26 25  Calcium 8.4 - 10.4 mg/dL 9.8 9.1  Total Protein 6.4 - 8.3 g/dL 7.1 7.2  Total Bilirubin 0.20 - 1.20 mg/dL <0.22 0.7  Alkaline  Phos 40 - 150 U/L 70 71  AST 5 - 34 U/L 17 21  ALT 0 - 55 U/L 15 21   PATHOLOGY:  Diagnosis 06/21/2016 Breast, lumpectomy, Left - FIBROCYSTIC CHANGES WITH ADENOSIS AND CALCIFICATIONS. - PSEUDOANGIOMATOUS STROMAL HYPERPLASIA ([place]). - HEALING BIOPSY SITE. - THERE IS NO EVIDENCE OF MALIGNANCY. - SEE COMMENT. Microscopic Comment The surgical resection margin(s) of the specimen were inked and microscopically evaluated.  Diagnosis 05/26/2016 Breast, left, needle core biopsy, UIQ - LOBULAR NEOPLASIA (ATYPICAL LOBULAR HYPERPLASIA). - FIBROCYSTIC CHANGES WITH ADENOSIS AND CALCIFICATIONS.  RADIOGRAPHIC STUDIES: I have personally reviewed the radiological images as listed and agreed with the findings in the report.  Diagnostic Mammogram 05/20/2016 IMPRESSION: 1. Palpable abnormality in the left breast is a simple cyst. 2. Calcifications adjacent to the palpable cyst are indeterminate and warrant tissue diagnosis to exclude malignancy.  ASSESSMENT & PLAN:  Dominique Taylor is a 41 y.o. premenopausal Caucasian female with:  1. left breast atypical lobular hyperplasia -I  discussed her imaging findings and biopsy and surgical pathology results with her in great details. -We reviewed the natural history of atypical hyperplasia. It is consider a benign breast disease, however it does increase the risk of breast cancer by 3-5 fold. It is considered as a high risk for breast cancer.  -We discussed annual screening mammogram, which will detect early stage breast cancer. She agrees to continue. She is very compliant on screening -Her breast density is grade D. I have recommended a breast MRI every year or two in addition to annual mammograms in the future for screening, we discussed the cost of MRI, she will think about it  -We also discussed healthy diet and regular exercise, calcium and vitamin D supplement, to reduce her risk of breast cancer -We further discussed chemoprevention to breast cancer. She is pre-manopausal, I recommend her to consider tamoxifen, which will likely reduce the risk of breast cancer by 30-40%, however there is no data of survival benefit so far.  -I used the KO model to predict her risk of breast cancer, and her estimated lifetime risk of breast cancer is 18% -The potential side effects of Tamoxifen, which includes but not limited to, hot flash, skin and vaginal dryness, slightly increased risk of cardiovascular disease and cataract, small risk of thrombosis and endometrial cancer, were discussed with her in great details. Preventive strategies for thrombosis, such as being physically active, using compression stocks, avoid cigarette smoking, etc., were reviewed with her. I also recommend her to follow-up with her gynecologist once a year, and watch for vaginal spotting or bleeding, as a clinically sign of endometrial cancer, etc. She voiced good understanding, and agrees to proceed.  -She has recovered well from surgery, she'll start tamoxifen in a few days  2. Bone health -She has never had a DEXA Scan -Start her on vitamins D and calcium  supplements -I discussed tamoxifen can increased her bone density.  3. Smoking cessation -She has history of heavy smoking, is trying to quit, using a nicotine patch -I recommended a smoking cessation class. I will refer her if she would like.   Plan -Lab today -Start tamoxifen in a few days, I called in for her today  -Return for f/u in 2 months   All questions were answered. The patient knows to call the clinic with any problems, questions or concerns.  I spent 45 minutes counseling the patient face to face. The total time spent in the appointment was 50 minutes and more than 50%  was on counseling.  This document serves as a record of services personally performed by Truitt Merle, MD. It was created on her behalf by Martinique Casey, a trained medical scribe. The creation of this record is based on the scribe's personal observations and the provider's statements to them. This document has been checked and approved by the attending provider.  I have reviewed the above documentation for accuracy and completeness and I agree with the above.   Truitt Merle, MD 07/29/2016 6:56 PM

## 2016-07-29 ENCOUNTER — Ambulatory Visit (HOSPITAL_BASED_OUTPATIENT_CLINIC_OR_DEPARTMENT_OTHER): Payer: Managed Care, Other (non HMO)

## 2016-07-29 ENCOUNTER — Telehealth: Payer: Self-pay | Admitting: Hematology

## 2016-07-29 ENCOUNTER — Encounter: Payer: Self-pay | Admitting: Hematology

## 2016-07-29 ENCOUNTER — Ambulatory Visit (HOSPITAL_BASED_OUTPATIENT_CLINIC_OR_DEPARTMENT_OTHER): Payer: Managed Care, Other (non HMO) | Admitting: Hematology

## 2016-07-29 DIAGNOSIS — N6092 Unspecified benign mammary dysplasia of left breast: Secondary | ICD-10-CM | POA: Diagnosis not present

## 2016-07-29 DIAGNOSIS — Z808 Family history of malignant neoplasm of other organs or systems: Secondary | ICD-10-CM

## 2016-07-29 DIAGNOSIS — Z720 Tobacco use: Secondary | ICD-10-CM | POA: Diagnosis not present

## 2016-07-29 DIAGNOSIS — F419 Anxiety disorder, unspecified: Secondary | ICD-10-CM | POA: Diagnosis not present

## 2016-07-29 HISTORY — DX: Unspecified benign mammary dysplasia of left breast: N60.92

## 2016-07-29 LAB — CBC WITH DIFFERENTIAL/PLATELET
BASO%: 0.6 % (ref 0.0–2.0)
BASOS ABS: 0.1 10*3/uL (ref 0.0–0.1)
EOS%: 2.8 % (ref 0.0–7.0)
Eosinophils Absolute: 0.2 10*3/uL (ref 0.0–0.5)
HCT: 39.6 % (ref 34.8–46.6)
HGB: 13.3 g/dL (ref 11.6–15.9)
LYMPH%: 31.2 % (ref 14.0–49.7)
MCH: 34.3 pg — AB (ref 25.1–34.0)
MCHC: 33.6 g/dL (ref 31.5–36.0)
MCV: 102.1 fL — ABNORMAL HIGH (ref 79.5–101.0)
MONO#: 0.6 10*3/uL (ref 0.1–0.9)
MONO%: 7.1 % (ref 0.0–14.0)
NEUT#: 4.9 10*3/uL (ref 1.5–6.5)
NEUT%: 58.3 % (ref 38.4–76.8)
Platelets: 250 10*3/uL (ref 145–400)
RBC: 3.88 10*6/uL (ref 3.70–5.45)
RDW: 12.9 % (ref 11.2–14.5)
WBC: 8.5 10*3/uL (ref 3.9–10.3)
lymph#: 2.6 10*3/uL (ref 0.9–3.3)

## 2016-07-29 LAB — COMPREHENSIVE METABOLIC PANEL
ALT: 15 U/L (ref 0–55)
AST: 17 U/L (ref 5–34)
Albumin: 4 g/dL (ref 3.5–5.0)
Alkaline Phosphatase: 70 U/L (ref 40–150)
Anion Gap: 8 mEq/L (ref 3–11)
BUN: 20.8 mg/dL (ref 7.0–26.0)
CO2: 26 meq/L (ref 22–29)
CREATININE: 0.8 mg/dL (ref 0.6–1.1)
Calcium: 9.8 mg/dL (ref 8.4–10.4)
Chloride: 105 mEq/L (ref 98–109)
EGFR: >90 mL/min/{1.73_m2} (ref >90)
Glucose: 92 mg/dl (ref 70–140)
POTASSIUM: 4.2 meq/L (ref 3.5–5.1)
Sodium: 139 mEq/L (ref 136–145)
Total Bilirubin: <0.22 mg/dL (ref 0.20–1.20)
Total Protein: 7.1 g/dL (ref 6.4–8.3)

## 2016-07-29 MED ORDER — TAMOXIFEN CITRATE 20 MG PO TABS: 20.0000 mg | ORAL_TABLET | Freq: Every day | ORAL | 2 refills | Status: DC

## 2016-07-29 NOTE — Telephone Encounter (Signed)
No LOS per 07/29/16 visit. Unable to reach Dr Burr Medico or her desk nurse. Lab appointment added per Lab orders. Patient was given a copy of the AVS report, per 07/29/16 visit.

## 2016-10-20 ENCOUNTER — Other Ambulatory Visit: Payer: Self-pay | Admitting: Hematology

## 2016-10-20 DIAGNOSIS — N6092 Unspecified benign mammary dysplasia of left breast: Secondary | ICD-10-CM

## 2016-10-21 ENCOUNTER — Telehealth: Payer: Self-pay

## 2016-10-21 NOTE — Telephone Encounter (Signed)
F/u appt needed per inbox message but patient has no voicemail set up.  The next avail appt was given and a calendar has been printed and mailed to the pt   Dominique Taylor

## 2016-11-13 ENCOUNTER — Telehealth: Payer: Self-pay | Admitting: Hematology

## 2016-11-13 NOTE — Telephone Encounter (Signed)
Called pt to advise of appt chgd from 7/9 (md call) to 7/23 @ 2.45pm. No answer,no vm. Mailed new appt calendar. Pt is MyChart active.

## 2016-11-21 ENCOUNTER — Ambulatory Visit: Payer: Managed Care, Other (non HMO) | Admitting: Hematology

## 2016-11-21 ENCOUNTER — Other Ambulatory Visit: Payer: Managed Care, Other (non HMO)

## 2016-12-02 NOTE — Progress Notes (Signed)
[name]  Telephone:(336) [phone] Fax:(336) [phone]  Clinic Follow-up Note   Patient Care Team: Dominique Hillock, DO as PCP - General (Family Medicine) Dominique Bookbinder, MD as Consulting Physician (General Surgery) 12/05/2016   CHIEF COMPLAINTS:  Follow up [facility] of the left breast  HISTORY OF PRESENTING ILLNESS (07/29/2016):  Dominique Taylor 41 y.o. female is here because of atypical lobular hyperplasia of the left breast. Pt underwent a diagnostic mammogram on 05/20/2016, which showed calcifications that could be concerning for malignancy. A biopsy was performed on 05/26/2016, which showed ALH. She underwent a lumpectomy on 06/21/2016 and presents to the cancer center for further cancer prevention.   She presents with her mother today. She found a lump on her breast, which is why she went to get a mammogram. She first noticed this lump May 04, 2016. She did notice some nipple discharge, no color. Denies pain. She did well after surgery. Denies swelling. When she's sick she has a lot of SOB, for which she uses an inhaler for. Denies loss of appetite, or any other concerns.   No history of medical problems. She takes Lexapro for anxiety. She still has regular periods. She had a tubal ligation in the past. Her mother had cervical cancer in the past, at the age of 82. Her father has a history of DM and HTN. Denies any other family history of cancer. She smokes 1 ppd, for the past 23 years. She is trying to quit; currently using nicotine patches. Occasional drinker.   GYN HISTORY  Menarchal: age 66 LMP: regular  Contraceptive: Tubal Ligation HRT: n/a  G2P2: One daughter and one son, 79 and 58 yo   CURRENT THERAPY: Tamoxifen once daily started 08/01/16   INTERVAL HISTORY:  Dominique Taylor is here for a follow up. She presents to the clinic today reporting she is having mood swings. She feels down sometimes. She is taking lexapro, given to her by Dominique Taylor. She was  on the lower dose but she increased it to 20mg  which is when she started Tamoxifen. She feels some weeks are worse than others. She takes the tamoxifen at night. She does have hot flashes and especially at nigh. But the intensity is not constant.  She is fine to continue Tamoxifen, She denies joint problem.  She still has regular periods. And she still sees her PCP.     MEDICAL HISTORY:  Past Medical History:  Diagnosis Date  . Abnormal Pap smear   . Breast mass, left   . Cancer ([place])    ovarian cervical ca  . Depression   . HPV (human papilloma virus) infection     SURGICAL HISTORY: Past Surgical History:  Procedure Laterality Date  . BREAST ENHANCEMENT SURGERY    . COLPOSCOPY    . RADIOACTIVE SEED GUIDED EXCISIONAL BREAST BIOPSY Left 06/21/2016   Procedure: LEFT BREAST RADIOACTIVE SEED GUIDED EXCISIONAL BIOPSY;  Surgeon: Dominique Bookbinder, MD;  Location: [facility];  Service: General;  Laterality: Left;  . TUBAL LIGATION      SOCIAL HISTORY: Social History   Social History  . Marital status: Married    Spouse name: N/A  . Number of children: 2  . Years of education: N/A   Occupational History  . Not on file.   Social History Main Topics  . Smoking status: Current Every Day Smoker    Packs/day: 1.00    Years: 23.00    Types: Cigarettes  . Smokeless tobacco: Never Used  .  Alcohol use No  . Drug use: No  . Sexual activity: Yes   Other Topics Concern  . Not on file   Social History Narrative   Dominique Taylor lives w/her husband & 2 grown children. She works Medical laboratory scientific officer.    FAMILY HISTORY: Family History  Problem Relation Age of Onset  . Arthritis Mother        RA  . Cancer Mother 70       cervical cancer   . Depression Father   . Hypertension Father   . Diabetes Father   . Ulcerative colitis Father   . Hypertension Brother     ALLERGIES:  is allergic to wellbutrin [bupropion].  MEDICATIONS:  Current Outpatient Prescriptions  Medication Sig  Dispense Refill  . albuterol (PROVENTIL HFA;VENTOLIN HFA) 108 (90 BASE) MCG/ACT inhaler Inhale 2 puffs into the lungs every 6 (six) hours as needed for wheezing. 1 Inhaler 1  . escitalopram (LEXAPRO) 20 MG tablet Take 1 tablet (20 mg total) by mouth daily. Needs office visit prior to anymore refills. 90 tablet 1  . nicotine (NICODERM CQ - DOSED IN MG/24 HOURS) 21 mg/24hr patch Place 1 patch (21 mg total) onto the skin daily. 14 patch 0  . nicotine polacrilex (NICORETTE) 2 MG gum Take 1 each (2 mg total) by mouth as needed for smoking cessation. 100 tablet 0  . Prenatal Vit-Fe Fumarate-FA (PRENATAL MULTIVITAMIN) TABS tablet Take 1 tablet by mouth daily at 12 noon.    . tamoxifen (NOLVADEX) 20 MG tablet TAKE 1 TABLET BY MOUTH ONCE DAILY 30 tablet 2   No current facility-administered medications for this visit.     REVIEW OF SYSTEMS:   Constitutional: Denies fevers, chills or abnormal night sweats (+) hot flashes Eyes: Denies blurriness of vision, double vision or watery eyes Ears, nose, mouth, throat, and face: Denies mucositis or sore throat Respiratory: Denies cough, dyspnea or wheezes (+) SOB Cardiovascular: Denies palpitation, chest discomfort or lower extremity swelling Gastrointestinal:  Denies nausea, heartburn or change in bowel habits Skin: Denies abnormal skin rashes Lymphatics: Denies new lymphadenopathy or easy bruising Neurological:Denies numbness, tingling or new weaknesses Behavioral/Psych: no new changes (+) mood swings All other systems were reviewed with the patient and are negative.  PHYSICAL EXAMINATION: ECOG PERFORMANCE STATUS: 0 - Asymptomatic  Vitals:   12/05/16 1532  BP: 136/73  Pulse: 64  Resp: 18   Filed Weights   12/05/16 1532  Weight: 114 lb 4.8 oz (51.8 kg)    GENERAL:alert, no distress and comfortable SKIN: skin color, texture, turgor are normal, no rashes or significant lesions EYES: normal, conjunctiva are pink and non-injected, sclera  clear OROPHARYNX:no exudate, no erythema and lips, buccal mucosa, and tongue normal  NECK: supple, thyroid normal size, non-tender, without nodularity LYMPH:  no palpable lymphadenopathy in the cervical, axillary or inguinal LUNGS: clear to auscultation and percussion with normal breathing effort HEART: regular rate & rhythm and no murmurs and no lower extremity edema ABDOMEN:abdomen soft, non-tender and normal bowel sounds Musculoskeletal:no cyanosis of digits and no clubbing  PSYCH: alert & oriented x 3 with fluent speech NEURO: no focal motor/sensory deficits BREASTS: Breast inspection showed them to be symmetrical with no nipple discharge. Palpation of the breasts and axilla revealed no obvious mass that I could appreciate. (+) L breast incision site well healed (+) b/l implant   LABORATORY DATA:  I have reviewed the data as listed CBC Latest Ref Rng & Units 12/05/2016 07/29/2016 02/24/2015  WBC 3.9 - 10.3 10e3/uL 7.4  8.5 8.6  Hemoglobin 11.6 - 15.9 g/dL 13.4 13.3 14.3  Hematocrit 34.8 - 46.6 % 39.6 39.6 41.5  Platelets 145 - 400 10e3/uL 265 250 282   CMP Latest Ref Rng & Units 12/05/2016 07/29/2016 06/29/2014  Glucose 70 - 140 mg/dl 96 92 113(H)  BUN 7.0 - 26.0 mg/dL 19.2 20.8 6  Creatinine 0.6 - 1.1 mg/dL 0.7 0.8 0.58  Sodium 136 - 145 mEq/L 140 139 136  Potassium 3.5 - 5.1 mEq/L 4.1 4.2 3.7  Chloride 96 - 112 mmol/L - - 102  CO2 22 - 29 mEq/L 23 26 25   Calcium 8.4 - 10.4 mg/dL 8.9 9.8 9.1  Total Protein 6.4 - 8.3 g/dL 6.5 7.1 7.2  Total Bilirubin 0.20 - 1.20 mg/dL <0.22 <0.22 0.7  Alkaline Phos 40 - 150 U/L 52 70 71  AST 5 - 34 U/L 19 17 21   ALT 0 - 55 U/L 13 15 21    PATHOLOGY:  Diagnosis 06/21/2016 Breast, lumpectomy, Left - FIBROCYSTIC CHANGES WITH ADENOSIS AND CALCIFICATIONS. - PSEUDOANGIOMATOUS STROMAL HYPERPLASIA ([place]). - HEALING BIOPSY SITE. - THERE IS NO EVIDENCE OF MALIGNANCY. - SEE COMMENT. Microscopic Comment The surgical resection margin(s) of the specimen  were inked and microscopically evaluated.  Diagnosis 05/26/2016 Breast, left, needle core biopsy, UIQ - LOBULAR NEOPLASIA (ATYPICAL LOBULAR HYPERPLASIA). - FIBROCYSTIC CHANGES WITH ADENOSIS AND CALCIFICATIONS.  RADIOGRAPHIC STUDIES: I have personally reviewed the radiological images as listed and agreed with the findings in the report.  Diagnostic Mammogram 05/20/2016 IMPRESSION: 1. Palpable abnormality in the left breast is a simple cyst. 2. Calcifications adjacent to the palpable cyst are indeterminate and warrant tissue diagnosis to exclude malignancy.  ASSESSMENT & PLAN:  Dominique. Damewood is a 41 y.o. premenopausal Caucasian female with:  1. left breast atypical lobular hyperplasia -I discussed her imaging findings and biopsy and surgical pathology results with her in great details. -We reviewed the natural history of atypical hyperplasia. It is consider a benign breast disease, however it does increase the risk of breast cancer by 3-5 fold. It is considered as a high risk for breast cancer.  -We discussed annual screening mammogram, which will detect early stage breast cancer. She agrees to continue. She is very compliant on screening -Her breast density is grade D. I have recommended a breast MRI every year or two in addition to annual mammograms in the future for screening, we discussed the cost of MRI, she will think about it  -We also discussed healthy diet and regular exercise, calcium and vitamin D supplement, to reduce her risk of breast cancer -She is on adjuvant tamoxifen to prevent breast cancer, tolerating well overall, with mild hot flash and some mood swing. She is on maximal dose of Lexapro. -Labs reviewed, normal CBC and CMP, normal breast exam.  -I discussed that tamoxifen can slow down her metabolism so she should monitor her cholesterol and sugar levels with her PCP.  -Due for mammogram 05/2017  -f/u in 6 months -She knows to contact us if mood swings worsen.    2. Bone  health -She has never had a DEXA Scan -Start her on vitamins D and calcium supplements -I discussed tamoxifen can increased her bone density.  3. Smoking cessation -She has history of heavy smoking, is trying to quit, using a nicotine patch -I recommended a smoking cessation class. I will refer her if she would like.   Plan -Continue Tamoxifen  -lab and f/u in 6 months  -mammogram in 05/2017   All questions were  answered. The patient knows to call the clinic with any problems, questions or concerns.  I spent 15 minutes counseling the patient face to face. The total time spent in the appointment was 20 minutes and more than 50% was on counseling.  This document serves as a record of services personally performed by Truitt Merle, MD. It was created on her behalf by Joslyn Devon, a trained medical scribe. The creation of this record is based on the scribe's personal observations and the provider's statements to them. This document has been checked and approved by the attending provider.   I have reviewed the above documentation for accuracy and completeness and I agree with the above.   Truitt Merle, MD 12/05/2016

## 2016-12-05 ENCOUNTER — Encounter: Payer: Self-pay | Admitting: Hematology

## 2016-12-05 ENCOUNTER — Other Ambulatory Visit (HOSPITAL_BASED_OUTPATIENT_CLINIC_OR_DEPARTMENT_OTHER): Payer: Self-pay

## 2016-12-05 ENCOUNTER — Telehealth: Payer: Self-pay | Admitting: Hematology

## 2016-12-05 ENCOUNTER — Ambulatory Visit (HOSPITAL_BASED_OUTPATIENT_CLINIC_OR_DEPARTMENT_OTHER): Payer: Self-pay | Admitting: Hematology

## 2016-12-05 VITALS — BP 136/73 | HR 64 | Resp 18 | Ht 63.0 in | Wt 114.3 lb

## 2016-12-05 DIAGNOSIS — N6092 Unspecified benign mammary dysplasia of left breast: Secondary | ICD-10-CM

## 2016-12-05 DIAGNOSIS — F39 Unspecified mood [affective] disorder: Secondary | ICD-10-CM

## 2016-12-05 DIAGNOSIS — Z720 Tobacco use: Secondary | ICD-10-CM

## 2016-12-05 DIAGNOSIS — Z79810 Long term (current) use of selective estrogen receptor modulators (SERMs): Secondary | ICD-10-CM

## 2016-12-05 DIAGNOSIS — N951 Menopausal and female climacteric states: Secondary | ICD-10-CM

## 2016-12-05 LAB — COMPREHENSIVE METABOLIC PANEL
ALBUMIN: 3.6 g/dL (ref 3.5–5.0)
ALK PHOS: 52 U/L (ref 40–150)
ALT: 13 U/L (ref 0–55)
AST: 19 U/L (ref 5–34)
Anion Gap: 8 mEq/L (ref 3–11)
BUN: 19.2 mg/dL (ref 7.0–26.0)
CALCIUM: 8.9 mg/dL (ref 8.4–10.4)
CO2: 23 mEq/L (ref 22–29)
Chloride: 109 mEq/L (ref 98–109)
Creatinine: 0.7 mg/dL (ref 0.6–1.1)
Glucose: 96 mg/dl (ref 70–140)
POTASSIUM: 4.1 meq/L (ref 3.5–5.1)
Sodium: 140 mEq/L (ref 136–145)
Total Bilirubin: <0.22 mg/dL (ref 0.20–1.20)
Total Protein: 6.5 g/dL (ref 6.4–8.3)

## 2016-12-05 LAB — CBC WITH DIFFERENTIAL/PLATELET
BASO%: 0.8 % (ref 0.0–2.0)
BASOS ABS: 0.1 10*3/uL (ref 0.0–0.1)
EOS ABS: 0.3 10*3/uL (ref 0.0–0.5)
EOS%: 3.5 % (ref 0.0–7.0)
HEMATOCRIT: 39.6 % (ref 34.8–46.6)
HEMOGLOBIN: 13.4 g/dL (ref 11.6–15.9)
LYMPH#: 2.4 10*3/uL (ref 0.9–3.3)
LYMPH%: 32.6 % (ref 14.0–49.7)
MCH: 34.8 pg — AB (ref 25.1–34.0)
MCHC: 33.8 g/dL (ref 31.5–36.0)
MCV: 102.8 fL — AB (ref 79.5–101.0)
MONO#: 0.6 10*3/uL (ref 0.1–0.9)
MONO%: 7.8 % (ref 0.0–14.0)
NEUT#: 4.1 10*3/uL (ref 1.5–6.5)
NEUT%: 55.3 % (ref 38.4–76.8)
Platelets: 265 10*3/uL (ref 145–400)
RBC: 3.86 10*6/uL (ref 3.70–5.45)
RDW: 13.4 % (ref 11.2–14.5)
WBC: 7.4 10*3/uL (ref 3.9–10.3)

## 2016-12-05 NOTE — Telephone Encounter (Signed)
Gave patient avs report and appointments for January 2019.  [facility] will arrange [facility] with [facility].

## 2017-01-04 ENCOUNTER — Ambulatory Visit (INDEPENDENT_AMBULATORY_CARE_PROVIDER_SITE_OTHER): Payer: BLUE CROSS/BLUE SHIELD | Admitting: Family Medicine

## 2017-01-04 ENCOUNTER — Other Ambulatory Visit: Payer: Self-pay | Admitting: Family Medicine

## 2017-01-04 ENCOUNTER — Encounter: Payer: Self-pay | Admitting: Family Medicine

## 2017-01-04 VITALS — BP 108/71 | HR 71 | Temp 98.4°F | Resp 20 | Ht 63.0 in | Wt 118.5 lb

## 2017-01-04 DIAGNOSIS — R61 Generalized hyperhidrosis: Secondary | ICD-10-CM

## 2017-01-04 DIAGNOSIS — Z79810 Long term (current) use of selective estrogen receptor modulators (SERMs): Secondary | ICD-10-CM | POA: Diagnosis not present

## 2017-01-04 DIAGNOSIS — F418 Other specified anxiety disorders: Secondary | ICD-10-CM

## 2017-01-04 MED ORDER — VENLAFAXINE HCL ER 37.5 MG PO CP24: 37.5000 mg | ORAL_CAPSULE | Freq: Every day | ORAL | 0 refills | Status: DC

## 2017-01-04 MED ORDER — VENLAFAXINE HCL ER 75 MG PO CP24: 75.0000 mg | ORAL_CAPSULE | Freq: Every day | ORAL | 0 refills | Status: DC

## 2017-01-04 NOTE — Patient Instructions (Signed)
Start effexor 37.5 mg daily. On day 8 increase to the 75 mg dose (script handed to you today).  Cut back on lexapro to 10 mg daily.  Follow up in 4 weeks.

## 2017-01-04 NOTE — Progress Notes (Signed)
CARENA Taylor , May 07, 1976, 41 y.o., female MRN: [id number] Patient Care Team    Relationship Specialty Notifications Start End  Ma Hillock, DO PCP - General Family Medicine  02/29/16   Rolm Bookbinder, MD Consulting Physician General Surgery  07/29/16     Chief Complaint  Patient presents with  . Depression  . Anxiety    Subjective:   depression and anxiety: Patient is doing okay on Effexor 20 mg daily. She feels she is more irritable, short tempered been normal. She also states she has night sweats, she is on tamoxifen secondary to fairly recent diagnosis of atypical lobular hyperplasia of her left breast. She thought she was handling the anxiety well, but being short tempered she is wondering if additional medication is not needed.   Mood disorder: negative 02/29/2016 Depression screen PHQ 2/9 01/04/2017 02/29/2016  Decreased Interest 1 1  Down, Depressed, Hopeless 0 3  PHQ - 2 Score 1 4  Altered sleeping 0 1  Tired, decreased energy 3 2  Change in appetite 1 2  Feeling bad or failure about yourself  0 3  Trouble concentrating 0 3  Moving slowly or fidgety/restless 0 3  Suicidal thoughts 0 1  PHQ-9 Score 5 19   GAD 7 : Generalized Anxiety Score 02/29/2016  Nervous, Anxious, on Edge 3  Control/stop worrying 3  Worry too much - different things 2  Trouble relaxing 2  Restless 1  Easily annoyed or irritable 3  Afraid - awful might happen 2  Total GAD 7 Score 16     Allergies  Allergen Reactions  . Wellbutrin [Bupropion] Itching   Social History  Substance Use Topics  . Smoking status: Current Every Day Smoker    Packs/day: 1.00    Years: 23.00    Types: Cigarettes  . Smokeless tobacco: Never Used  . Alcohol use No   Past Medical History:  Diagnosis Date  . Abnormal Pap smear   . Breast mass, left   . Cancer ([place])    ovarian cervical ca  . Depression   . HPV (human papilloma virus) infection    Past Surgical History:  Procedure  Laterality Date  . BREAST ENHANCEMENT SURGERY    . COLPOSCOPY    . RADIOACTIVE SEED GUIDED EXCISIONAL BREAST BIOPSY Left 06/21/2016   Procedure: LEFT BREAST RADIOACTIVE SEED GUIDED EXCISIONAL BIOPSY;  Surgeon: Rolm Bookbinder, MD;  Location: [facility];  Service: General;  Laterality: Left;  . TUBAL LIGATION     Family History  Problem Relation Age of Onset  . Arthritis Mother        RA  . Cancer Mother 10       cervical cancer   . Depression Father   . Hypertension Father   . Diabetes Father   . Ulcerative colitis Father   . Hypertension Brother    Allergies as of 01/04/2017      Reactions   Wellbutrin [bupropion] Itching      Medication List       Accurate as of 01/04/17 11:59 PM. Always use your most recent med list.          albuterol 108 (90 Base) MCG/ACT inhaler Commonly known as:  PROVENTIL HFA;VENTOLIN HFA Inhale 2 puffs into the lungs every 6 (six) hours as needed for wheezing.   escitalopram 20 MG tablet Commonly known as:  LEXAPRO Take 1 tablet (20 mg total) by mouth daily. Needs office visit prior to anymore refills.   prenatal  multivitamin Tabs tablet Take 1 tablet by mouth daily at 12 noon.   tamoxifen 20 MG tablet Commonly known as:  NOLVADEX TAKE 1 TABLET BY MOUTH ONCE DAILY   venlafaxine XR 37.5 MG 24 hr capsule Commonly known as:  EFFEXOR-XR Take 1 capsule (37.5 mg total) by mouth daily with breakfast.   venlafaxine XR 75 MG 24 hr capsule Commonly known as:  EFFEXOR-XR Take 1 capsule (75 mg total) by mouth daily with breakfast.            Discharge Care Instructions        Start     Ordered   01/04/17 0000  venlafaxine XR (EFFEXOR-XR) 37.5 MG 24 hr capsule  Daily with breakfast     01/04/17 1558   01/04/17 0000  venlafaxine XR (EFFEXOR-XR) 75 MG 24 hr capsule  Daily with breakfast     01/04/17 1600      No results found for this or any previous visit (from the past 24 hour(s)). No results found.   ROS:  Negative, with the exception of above mentioned in HPI   Objective:  BP 108/71 (BP Location: Left Arm, Patient Position: Sitting, Cuff Size: Normal)   Pulse 71   Temp 98.4 F (36.9 C)   Resp 20   Ht 5\' 3"  (1.6 m)   Wt 118 lb 8 oz (53.8 kg)   SpO2 96%   BMI 20.99 kg/m  Body mass index is 20.99 kg/m. Gen: Afebrile. No acute distress.  HENT: AT. [place]. MMM. Eyes:Pupils Equal Round Reactive to light, Extraocular movements intact,  Conjunctiva without redness, discharge or icterus. Neck/lymp/endocrine: Supple, no lymphadenopathy, no thyromegaly CV: RRR, no edema, +2/4 P posterior tibialis pulses Chest: CTAB, no wheeze or crackles Neuro:  Normal gait. PERLA. EOMi. Alert. Oriented.  Psych: Normal affect, dress and demeanor. Mild increase in anxiety. Normal speech. Normal thought content and judgment..   Assessment/Plan: Dominique Taylor is a 41 y.o. female present for OV for  Depression with anxiety - Mildly worsening, mostly anxiety features are worsening. She is undergoing breast cancer treatment with tamoxifen that may be contributing to her anxiety. She is also having night sweats that are most likely coming from tamoxifen. - Discussed the benefits of the Effexor for both of her issues today. Patient is agreeable to starting Effexor taper.  - Will cut back on Lexapro to 10 mg daily. - Follow-up in 4 weeks   electronically signed by:  Howard Pouch, DO  [place]

## 2017-01-06 ENCOUNTER — Ambulatory Visit: Payer: Managed Care, Other (non HMO) | Admitting: Family Medicine

## 2017-01-09 DIAGNOSIS — R61 Generalized hyperhidrosis: Secondary | ICD-10-CM | POA: Insufficient documentation

## 2017-01-09 DIAGNOSIS — Z79810 Long term (current) use of selective estrogen receptor modulators (SERMs): Secondary | ICD-10-CM | POA: Insufficient documentation

## 2017-01-12 ENCOUNTER — Encounter: Payer: Self-pay | Admitting: Family Medicine

## 2017-01-12 ENCOUNTER — Other Ambulatory Visit: Payer: Self-pay | Admitting: Family Medicine

## 2017-01-12 DIAGNOSIS — F418 Other specified anxiety disorders: Secondary | ICD-10-CM

## 2017-01-13 ENCOUNTER — Other Ambulatory Visit: Payer: Self-pay | Admitting: *Deleted

## 2017-01-13 MED ORDER — ESCITALOPRAM OXALATE 10 MG PO TABS: 10.0000 mg | ORAL_TABLET | Freq: Every day | ORAL | 0 refills | Status: DC

## 2017-01-13 NOTE — Telephone Encounter (Signed)
Sent refill of patients Lexapro for 30 day supply patient due for follow up in 3.5 weeks.

## 2017-01-31 ENCOUNTER — Other Ambulatory Visit: Payer: Self-pay | Admitting: Hematology

## 2017-01-31 DIAGNOSIS — N6092 Unspecified benign mammary dysplasia of left breast: Secondary | ICD-10-CM

## 2017-02-01 ENCOUNTER — Telehealth: Payer: Self-pay | Admitting: Family Medicine

## 2017-02-01 ENCOUNTER — Ambulatory Visit (INDEPENDENT_AMBULATORY_CARE_PROVIDER_SITE_OTHER): Payer: BLUE CROSS/BLUE SHIELD | Admitting: Family Medicine

## 2017-02-01 ENCOUNTER — Encounter: Payer: Self-pay | Admitting: Hematology

## 2017-02-01 ENCOUNTER — Encounter: Payer: Self-pay | Admitting: Family Medicine

## 2017-02-01 VITALS — BP 111/72 | HR 88 | Temp 98.2°F | Resp 18 | Ht 63.0 in | Wt 114.0 lb

## 2017-02-01 DIAGNOSIS — R61 Generalized hyperhidrosis: Secondary | ICD-10-CM

## 2017-02-01 DIAGNOSIS — F418 Other specified anxiety disorders: Secondary | ICD-10-CM | POA: Diagnosis not present

## 2017-02-01 MED ORDER — ESCITALOPRAM OXALATE 10 MG PO TABS: 10.0000 mg | ORAL_TABLET | Freq: Every day | ORAL | 0 refills | Status: DC

## 2017-02-01 MED ORDER — VENLAFAXINE HCL ER 75 MG PO CP24: 150.0000 mg | ORAL_CAPSULE | Freq: Every day | ORAL | 0 refills | Status: DC

## 2017-02-01 NOTE — Patient Instructions (Signed)
Great to see you.  Increase Effexor to 150 mg daily today.  If doing well follow up in 3 months, sooner if needed.   Please help [place] help you:  We are honored you have chosen [facility] for your Primary Care home. Below you will find basic instructions that you may need to access in the future. Please help [place] help you by reading the instructions, which cover many of the frequent questions we experience.   Prescription refills and request:  -In order to allow more efficient response time, please call your pharmacy for all refills. They will forward the request electronically to [place]. This allows for the quickest possible response. Request left on a nurse line can take longer to refill, since these are checked as time allows between office patients and other phone calls.  - refill request can take up to 3-5 working days to complete.  - If request is sent electronically and request is appropiate, it is usually completed in 1-2 business days.  - all patients will need to be seen routinely for all chronic medical conditions requiring prescription medications (see follow-up below). If you are overdue for follow up on your condition, you will be asked to make an appointment and we will call in enough medication to cover you until your appointment (up to 30 days).  - all controlled substances will require a face to face visit to request/refill.  - if you desire your prescriptions to go through a new pharmacy, and have an active script at original pharmacy, you will need to call your pharmacy and have scripts transferred to new pharmacy. This is completed between the pharmacy locations and not by your provider.    Results: If any images or labs were ordered, it can take up to 1 week to get results depending on the test ordered and the lab/facility running and resulting the test. - Normal or stable results, which do not need further discussion, may be released to your mychart immediately with attached  note to you. A call may not be generated for normal results. Please make certain to sign up for mychart. If you have questions on how to activate your mychart you can call the front office.  - If your results need further discussion, our office will attempt to contact you via phone, and if unable to reach you after 2 attempts, we will release your abnormal result to your mychart with instructions.  - All results will be automatically released in mychart after 1 week.  - Your provider will provide you with explanation and instruction on all relevant material in your results. Please keep in mind, results and labs may appear confusing or abnormal to the untrained eye, but it does not mean they are actually abnormal for you personally. If you have any questions about your results that are not covered, or you desire more detailed explanation than what was provided, you should make an appointment with your provider to do so.   Our office handles many outgoing and incoming calls daily. If we have not contacted you within 1 week about your results, please check your mychart to see if there is a message first and if not, then contact our office.  In helping with this matter, you help decrease call volume, and therefore allow [place] to be able to respond to patients needs more efficiently.   Acute office visits (sick visit):  An acute visit is intended for a new problem and are scheduled in shorter time  slots to allow schedule openings for patients with new problems. This is the appropriate visit to discuss a new problem. In order to provide you with excellent quality medical care with proper time for you to explain your problem, have an exam and receive treatment with instructions, these appointments should be limited to one new problem per visit. If you experience a new problem, in which you desire to be addressed, please make an acute office visit, we save openings on the schedule to accommodate you. Please do not save  your new problem for any other type of visit, let us take care of it properly and quickly for you.   Follow up visits:  Depending on your condition(s) your provider will need to see you routinely in order to provide you with quality care and prescribe medication(s). Most chronic conditions (Example: hypertension, Diabetes, depression/anxiety... etc), require visits a couple times a year. Your provider will instruct you on proper follow up for your personal medical conditions and history. Please make certain to make follow up appointments for your condition as instructed. Failing to do so could result in lapse in your medication treatment/refills. If you request a refill, and are overdue to be seen on a condition, we will always provide you with a 30 day script (once) to allow you time to schedule.    Medicare wellness (well visit): - we have a wonderful Nurse Maudie Mercury), that will meet with you and provide you will yearly medicare wellness visits. These visits should occur yearly (can not be scheduled less than 1 calendar year apart) and cover preventive health, immunizations, advance directives and screenings you are entitled to yearly through your medicare benefits. Do not miss out on your entitled benefits, this is when medicare will pay for these benefits to be ordered for you.  These are strongly encouraged by your provider and is the appropriate type of visit to make certain you are up to date with all preventive health benefits. If you have not had your medicare wellness exam in the last 12 months, please make certain to schedule one by calling the office and schedule your medicare wellness with Maudie Mercury as soon as possible.   Yearly physical (well visit):  - Adults are recommended to be seen yearly for physicals. Check with your insurance and date of your last physical, most insurances require one calendar year between physicals. Physicals include all preventive health topics, screenings, medical exam and  labs that are appropriate for gender/age and history. You may have fasting labs needed at this visit. This is a well visit (not a sick visit), new problems should not be covered during this visit (see acute visit).  - Pediatric patients are seen more frequently when they are younger. Your provider will advise you on well child visit timing that is appropriate for your their age. - This is not a medicare wellness visit. Medicare wellness exams do not have an exam portion to the visit. Some medicare companies allow for a physical, some do not allow a yearly physical. If your medicare allows a yearly physical you can schedule the medicare wellness with our nurse Maudie Mercury and have your physical with your provider after, on the same day. Please check with insurance for your full benefits.   Late Policy/No Shows:  - all new patients should arrive 15-30 minutes earlier than appointment to allow [place] time  to  obtain all personal demographics,  insurance information and for you to complete office paperwork. - All established patients should arrive  10-15 minutes earlier than appointment time to update all information and be checked in .  - In our best efforts to run on time, if you are late for your appointment you will be asked to either reschedule or if able, we will work you back into the schedule. There will be a wait time to work you back in the schedule,  depending on availability.  - If you are unable to make it to your appointment as scheduled, please call 24 hours ahead of time to allow [place] to fill the time slot with someone else who needs to be seen. If you do not cancel your appointment ahead of time, you may be charged a no show fee.

## 2017-02-01 NOTE — Telephone Encounter (Signed)
Please call pt: - I do not see that she has had a PAP since her LEEP procedure in 2015. If this is the case she is strongly encouraged to have a PAP ASAP, especially with her recent diagnosis and Tamoxifen use.  - If she has had PAP follow ups somewhere, we need to obtain those records.   - she is overdue for her physical and we should check fasting labs on her with tamoxifen use, so if she would like to have PAP completed here with her CPE we can certainly do that for her.

## 2017-02-01 NOTE — Progress Notes (Signed)
Dominique Taylor , 1975-10-28, 41 y.o., female MRN: [id number] Patient Care Team    Relationship Specialty Notifications Start End  Ma Hillock, DO PCP - General Family Medicine  02/29/16   Rolm Bookbinder, MD Consulting Physician General Surgery  07/29/16     Chief Complaint  Patient presents with  . Depression  . Anxiety    Subjective:   depression and anxiety: Patient presents today for follow-up on her depression and anxiety after starting Effexor 75 mg daily 4 weeks ago. Her depression screening has improved from 5-0 her anxiety screening has remained approximately the same. Her hot flashes, possibly secondary to tamoxifen use, are unchanged. She states she does note that she is less irritable on the medication, however feel she could use an increased dose. She did decrease her Lexapro to 10 mg daily while tapering up on the Effexor. Prior note 01/04/2017 Patient is doing okay on leapro 20 mg daily. She feels she is more irritable, short tempered been normal. She also states she has night sweats, she is on tamoxifen secondary to fairly recent diagnosis of atypical lobular hyperplasia of her left breast. She thought she was handling the anxiety well, but being short tempered she is wondering if additional medication is not needed.   Mood disorder: negative 02/29/2016 Depression screen [facility] 2/9 02/01/2017 01/04/2017 02/29/2016  Decreased Interest 0 1 1  Down, Depressed, Hopeless 0 0 3  PHQ - 2 Score 0 1 4  Altered sleeping 0 0 1  Tired, decreased energy 0 3 2  Change in appetite 0 1 2  Feeling bad or failure about yourself  0 0 3  Trouble concentrating 0 0 3  Moving slowly or fidgety/restless 0 0 3  Suicidal thoughts 0 0 1  PHQ-9 Score 0 5 19  Difficult doing work/chores Not difficult at all - -   GAD 7 : Generalized Anxiety Score 02/01/2017 02/29/2016  Nervous, Anxious, on Edge 2 3  Control/stop worrying 3 3  Worry too much - different things 3 2  Trouble relaxing  2 2  Restless 3 1  Easily annoyed or irritable 2 3  Afraid - awful might happen 2 2  Total GAD 7 Score 17 16  Anxiety Difficulty Somewhat difficult -     Allergies  Allergen Reactions  . Wellbutrin [Bupropion] Itching   Social History  Substance Use Topics  . Smoking status: Current Every Day Smoker    Packs/day: 1.00    Years: 23.00    Types: Cigarettes  . Smokeless tobacco: Never Used  . Alcohol use No   Past Medical History:  Diagnosis Date  . Abnormal Pap smear   . Breast mass, left   . Cancer ([place])    ovarian cervical ca  . Depression   . HPV (human papilloma virus) infection    Past Surgical History:  Procedure Laterality Date  . BREAST ENHANCEMENT SURGERY    . COLPOSCOPY    . RADIOACTIVE SEED GUIDED EXCISIONAL BREAST BIOPSY Left 06/21/2016   Procedure: LEFT BREAST RADIOACTIVE SEED GUIDED EXCISIONAL BIOPSY;  Surgeon: Rolm Bookbinder, MD;  Location: [facility];  Service: General;  Laterality: Left;  . TUBAL LIGATION     Family History  Problem Relation Age of Onset  . Arthritis Mother        RA  . Cancer Mother 32       cervical cancer   . Depression Father   . Hypertension Father   . Diabetes Father   .  Ulcerative colitis Father   . Hypertension Brother    Allergies as of 02/01/2017      Reactions   Wellbutrin [bupropion] Itching      Medication List       Accurate as of 02/01/17  4:03 PM. Always use your most recent med list.          albuterol 108 (90 Base) MCG/ACT inhaler Commonly known as:  PROVENTIL HFA;VENTOLIN HFA Inhale 2 puffs into the lungs every 6 (six) hours as needed for wheezing.   escitalopram 10 MG tablet Commonly known as:  LEXAPRO Take 1 tablet (10 mg total) by mouth daily.   prenatal multivitamin Tabs tablet Take 1 tablet by mouth daily at 12 noon.   tamoxifen 20 MG tablet Commonly known as:  NOLVADEX TAKE 1 TABLET BY MOUTH ONCE DAILY   venlafaxine XR 75 MG 24 hr capsule Commonly known as:   EFFEXOR-XR Take 2 capsules (150 mg total) by mouth daily with breakfast.            Discharge Care Instructions        Start     Ordered   02/01/17 0000  venlafaxine XR (EFFEXOR-XR) 75 MG 24 hr capsule  Daily with breakfast     02/01/17 1601      No results found for this or any previous visit (from the past 24 hour(s)). No results found.   ROS: Negative, with the exception of above mentioned in HPI   Objective:  BP 111/72 (BP Location: Left Arm, Patient Position: Sitting, Cuff Size: Normal)   Pulse 88   Temp 98.2 F (36.8 C)   Resp 18   Ht 5\' 3"  (1.6 m)   Wt 114 lb (51.7 kg)   SpO2 96%   BMI 20.19 kg/m  Body mass index is 20.19 kg/m. Gen: Afebrile. No acute distress.  HENT: AT. [place].  MMM.  Eyes:Pupils Equal Round Reactive to light, Extraocular movements intact,  Conjunctiva without redness, discharge or icterus. CV: RRR  Chest: CTAB, no wheeze or crackles Neuro:  Normal gait. PERLA. EOMi. Alert. Orientedx3  Psych: Normal affect, dress and demeanor. Normal speech. Normal thought content and judgment.  Assessment/Plan: ANANI GU is a 41 y.o. female present for OV for  Depression with anxiety - Improved anxiety by patient report, GAD assessment the same. Hot flashes are unchanged. Will try increased dose of effexor to 150 mg a day to try to get more coverage for tamoxifen induced hot flashes.  - Will try to keep at two 75 mg capsules, in the event dose needs to be changed again will not waste medication.  - Continue Lexapro to 10 mg daily. - Follow-up 3 months, sooner if not improving.   electronically signed by:  Howard Pouch, DO  [place]

## 2017-02-02 NOTE — Telephone Encounter (Signed)
Spoke with patient she is scheduled to get her PAP October 3 with her GYN she will have them forward results to [place] and any labs she gets done there . She will call to schedule CPE if needed after that time.

## 2017-02-15 ENCOUNTER — Ambulatory Visit (INDEPENDENT_AMBULATORY_CARE_PROVIDER_SITE_OTHER): Payer: BLUE CROSS/BLUE SHIELD | Admitting: Obstetrics and Gynecology

## 2017-02-15 ENCOUNTER — Other Ambulatory Visit (HOSPITAL_COMMUNITY)
Admission: RE | Admit: 2017-02-15 | Discharge: 2017-02-15 | Disposition: A | Discharge status: Home / Self Care | Payer: BLUE CROSS/BLUE SHIELD | Source: Ambulatory Visit | Attending: Obstetrics and Gynecology | Admitting: Obstetrics and Gynecology

## 2017-02-15 VITALS — BP 106/64 | HR 102 | Ht 64.5 in | Wt 113.0 lb

## 2017-02-15 DIAGNOSIS — Z1211 Encounter for screening for malignant neoplasm of colon: Secondary | ICD-10-CM

## 2017-02-15 DIAGNOSIS — N898 Other specified noninflammatory disorders of vagina: Secondary | ICD-10-CM | POA: Diagnosis not present

## 2017-02-15 DIAGNOSIS — Z01419 Encounter for gynecological examination (general) (routine) without abnormal findings: Secondary | ICD-10-CM | POA: Diagnosis present

## 2017-02-15 DIAGNOSIS — Z01411 Encounter for gynecological examination (general) (routine) with abnormal findings: Secondary | ICD-10-CM | POA: Diagnosis not present

## 2017-02-15 DIAGNOSIS — N6320 Unspecified lump in the left breast, unspecified quadrant: Secondary | ICD-10-CM | POA: Diagnosis not present

## 2017-02-15 DIAGNOSIS — R87612 Low grade squamous intraepithelial lesion on cytologic smear of cervix (LGSIL): Secondary | ICD-10-CM

## 2017-02-15 DIAGNOSIS — Z1212 Encounter for screening for malignant neoplasm of rectum: Secondary | ICD-10-CM | POA: Diagnosis not present

## 2017-02-15 LAB — POCT WET PREP (WET MOUNT)
KOH Wet Prep POC: NEGATIVE
TRICHOMONAS WET PREP HPF POC: ABSENT

## 2017-02-15 LAB — HEMOCCULT GUIAC POC 1CARD (OFFICE): Fecal Occult Blood, POC: NEGATIVE

## 2017-02-15 MED ORDER — METRONIDAZOLE 0.75 % VA GEL
1.0000 | Freq: Every day | VAGINAL | 1 refills | Status: DC
Start: 2017-02-15 — End: 2017-06-05

## 2017-02-15 NOTE — Assessment & Plan Note (Signed)
Atypical lobular hyperplasia by biopsy with excisional biopsy performed

## 2017-02-15 NOTE — Progress Notes (Signed)
Patient ID: Dominique Taylor, female   DOB: 1976-03-02, 41 y.o.   MRN: [id number]  Assessment:  1. Annual Gyn Exam 2. BV 3. Hx LSIL in  2014, no follow up 4.Hx of breast  Plan:  1. pap smear done, next pap due 1 year 2. return annually or prn 3.   Annual mammogram advised after age 79 4.   Rx Metrogel  With refils x 1 Subjective:  Dominique Taylor is a 41 y.o. female No obstetric history on file. who presents for annual exam. Patient's last menstrual period was 02/10/2017 (exact date). The patient reports today for a gyn exam. She notes that her period should have ended yesterday. Pt reports she is married. Her last pap was done on 09/18/2012. She had an abnormal pap that led to a colpo. Pt had a left breast biopsy done February 2018. She has implants in both breasts that were done in the past. Her mother had ovarian, and cervical cancer while she was pregnant with pt. Pt denies any issues with her BM, or any other symptoms at this time.  The following portions of the patient's history were reviewed and updated as appropriate: allergies, current medications, past family history, past medical history, past social history, past surgical history and problem list. Past Medical History:  Diagnosis Date  . Abnormal Pap smear   . Breast mass, left   . Cancer ([place])    ovarian cervical ca  . Depression   . HPV (human papilloma virus) infection     Past Surgical History:  Procedure Laterality Date  . BREAST ENHANCEMENT SURGERY    . COLPOSCOPY    . RADIOACTIVE SEED GUIDED EXCISIONAL BREAST BIOPSY Left 06/21/2016   Procedure: LEFT BREAST RADIOACTIVE SEED GUIDED EXCISIONAL BIOPSY;  Surgeon: Rolm Bookbinder, MD;  Location: [facility];  Service: General;  Laterality: Left;  . TUBAL LIGATION       Current Outpatient Prescriptions:  .  albuterol (PROVENTIL HFA;VENTOLIN HFA) 108 (90 BASE) MCG/ACT inhaler, Inhale 2 puffs into the lungs every 6 (six) hours as needed for wheezing.,  Disp: 1 Inhaler, Rfl: 1 .  escitalopram (LEXAPRO) 10 MG tablet, Take 1 tablet (10 mg total) by mouth daily., Disp: 90 tablet, Rfl: 0 .  Prenatal Vit-Fe Fumarate-FA (PRENATAL MULTIVITAMIN) TABS tablet, Take 1 tablet by mouth daily at 12 noon., Disp: , Rfl:  .  tamoxifen (NOLVADEX) 20 MG tablet, TAKE 1 TABLET BY MOUTH ONCE DAILY, Disp: 90 tablet, Rfl: 3 .  venlafaxine XR (EFFEXOR-XR) 75 MG 24 hr capsule, Take 2 capsules (150 mg total) by mouth daily with breakfast., Disp: 180 capsule, Rfl: 0  Review of Systems Constitutional: negative Gastrointestinal: negative Genitourinary: negative  Objective:  BP 106/64 (BP Location: Right Arm, Patient Position: Sitting, Cuff Size: Normal)   Pulse (!) 102   Ht 5' 4.5" (1.638 m)   Wt 113 lb (51.3 kg)   LMP 02/10/2017 (Exact Date)   BMI 19.10 kg/m    BMI: Body mass index is 19.1 kg/m.  General Appearance: Alert, appropriate appearance for age. No acute distress HEENT: Grossly normal Neck / Thyroid:  Cardiovascular: RRR; normal S1, S2, no murmur Lungs: CTA bilaterally Back: No CVAT Breast Exam: No dimpling, nipple retraction or discharge. No masses or nodes., Normal to inspection, Normal breast tissue bilaterally and No masses or nodes.No dimpling, nipple retraction or discharge. Gastrointestinal: Soft, non-tender, no masses or organomegaly Pelvic Exam: Vulva and vagina appear normal. Bimanual exam reveals normal uterus and adnexa. External genitalia:  normal Urinary system:  Vaginal: heavy discharge with slight brown discoloration Cervix: normal Uterus: retroverted, small, ovaries are small Wet prep: heavy clue cells, negative trichinosis KOH: positive whiff, negative yeast  Rectovaginal: hemoccult is negative Lymphatic Exam: Non-palpable nodes in neck, clavicular, axillary, or inguinal regions  Skin: no rash or abnormalities Neurologic: Normal gait and speech, no tremor  Psychiatric: Alert and oriented, appropriate affect.  Urinalysis:Not  done  Mallory Shirk. MD Pgr [phone] 10:27 AM   By signing my name below, I, Margit Banda, attest that this documentation has been prepared under the direction and in the presence of Jonnie Kind, MD. Electronically Signed: Margit Banda, Medical Scribe. 02/15/17. 10:27 AM.  I personally performed the services described in this documentation, which was SCRIBED in my presence. The recorded information has been reviewed and considered accurate. It has been edited as necessary during review. Jonnie Kind, MD

## 2017-02-15 NOTE — Patient Instructions (Signed)
Thank you for enrolling in [place]. Please follow the instructions below to securely access your online medical record. MyChart allows you to send messages to your doctor, view your test results, renew your prescriptions, schedule appointments, and more.  How Do I Sign Up? 1. In your Internet browser, go to [email or web address] 2. Click on the New  User? link in the Sign In box.  3. Enter your MyChart Access Code exactly as it appears below. You will not need to use this code after you have completed the sign-up process. If you do not sign up before the expiration date, you must request a new code. MyChart Access Code: Activation code not generated Current MyChart Status: Active  4. Enter the last four digits of your Social Security Number (xxxx) and Date of Birth (mm/dd/yyyy) as indicated and click Next. You will be taken to the next sign-up page. 5. Create a MyChart ID. This will be your MyChart login ID and cannot be changed, so think of one that is secure and easy to remember. 6. Create a MyChart password. You can change your password at any time. 7. Enter your Password Reset Question and Answer and click Next. This can be used at a later time if you forget your password.  8. Select your communication preference, and if applicable enter your e-mail address. You will receive e-mail notification when new information is available in MyChart by choosing to receive e-mail notifications and filling in your e-mail. 9. Click Sign In. You can now view your medical record.   Additional Information If you have questions, you can email [email or web address]  [email or web address] or call [phone] to talk to our [facility] staff. Remember, MyChart is NOT to be used for urgent needs. For medical emergencies, dial 911.

## 2017-02-20 LAB — CYTOLOGY - PAP
CHLAMYDIA, DNA PROBE: NEGATIVE
HPV: DETECTED — AB
NEISSERIA GONORRHEA: NEGATIVE

## 2017-02-21 ENCOUNTER — Telehealth: Payer: Self-pay | Admitting: *Deleted

## 2017-02-21 NOTE — Telephone Encounter (Signed)
LMOVM that Pap showed significant abnormalities per Dr Glo Herring and needs to have colposcopy with biopsies. Advised to call our office to schedule appointment.

## 2017-03-06 ENCOUNTER — Encounter: Payer: BLUE CROSS/BLUE SHIELD | Admitting: Obstetrics and Gynecology

## 2017-03-17 ENCOUNTER — Encounter: Payer: Self-pay | Admitting: Obstetrics and Gynecology

## 2017-03-17 ENCOUNTER — Other Ambulatory Visit: Payer: Self-pay | Admitting: Obstetrics and Gynecology

## 2017-03-17 ENCOUNTER — Ambulatory Visit (INDEPENDENT_AMBULATORY_CARE_PROVIDER_SITE_OTHER): Payer: BLUE CROSS/BLUE SHIELD | Admitting: Obstetrics and Gynecology

## 2017-03-17 VITALS — BP 104/66 | HR 92 | Ht 63.5 in | Wt 115.6 lb

## 2017-03-17 DIAGNOSIS — Z3202 Encounter for pregnancy test, result negative: Secondary | ICD-10-CM

## 2017-03-17 DIAGNOSIS — N870 Mild cervical dysplasia: Secondary | ICD-10-CM

## 2017-03-17 DIAGNOSIS — N871 Moderate cervical dysplasia: Secondary | ICD-10-CM | POA: Diagnosis not present

## 2017-03-17 DIAGNOSIS — R87613 High grade squamous intraepithelial lesion on cytologic smear of cervix (HGSIL): Secondary | ICD-10-CM

## 2017-03-17 NOTE — Progress Notes (Addendum)
  Dominique Taylor 41 y.o. G2P2 here for colposcopy for high-grade squamous intraepithelial neoplasia  (HGSIL-encompassing moderate and severe dysplasia), HPV positive, pap smear on 02/15/17.  Discussed role for HPV in cervical dysplasia, need for surveillance. This is pt's  4th abnormal pap smear. She has had a colposcopies before, but has never had treatments. Pt does smoke.   Patient given informed consent, signed copy in the chart, time out was performed.  Placed in lithotomy position. Cervix viewed with speculum and colposcope after application of acetic acid.   Colposcopy adequate? Yes Area of white patch at 5:00; biopsies obtained at anterior lip from 10:00-11:00, and posterior lip from 4:00-6:00.  ECC specimen obtained. All specimens were labelled and sent to pathology.   Colposcopy IMPRESSION: CIN I-II, posterior lip of cervix  Patient was given post procedure instructions. Will follow up pathology and manage accordingly.  Routine preventative health maintenance measures emphasized. Appointment for 4 weeks , just after menses , for  probable LEEP    By signing my name below, I, Izna Ahmed, attest that this documentation has been prepared under the direction and in the presence of Jonnie Kind, MD. Electronically Signed: Jabier Gauss, Medical Scribe. 03/17/17. 9:51 AM.  I personally performed the services described in this documentation, which was SCRIBED in my presence. The recorded information has been reviewed and considered accurate. It has been edited as necessary during review. Jonnie Kind, MD

## 2017-04-18 ENCOUNTER — Other Ambulatory Visit: Payer: Self-pay | Admitting: Hematology

## 2017-04-18 DIAGNOSIS — Z1231 Encounter for screening mammogram for malignant neoplasm of breast: Secondary | ICD-10-CM

## 2017-04-18 DIAGNOSIS — N6092 Unspecified benign mammary dysplasia of left breast: Secondary | ICD-10-CM

## 2017-04-19 ENCOUNTER — Other Ambulatory Visit: Payer: Self-pay | Admitting: Family Medicine

## 2017-04-19 ENCOUNTER — Encounter: Payer: Self-pay | Admitting: Family Medicine

## 2017-04-19 MED ORDER — ESCITALOPRAM OXALATE 10 MG PO TABS
10.0000 mg | ORAL_TABLET | Freq: Every day | ORAL | 0 refills | Status: DC
Start: 2017-04-19 — End: 2017-04-28

## 2017-04-19 MED ORDER — VENLAFAXINE HCL ER 75 MG PO CP24: 150.0000 mg | ORAL_CAPSULE | Freq: Every day | ORAL | 0 refills | Status: DC

## 2017-04-19 NOTE — Telephone Encounter (Signed)
Dr. Kuneff pt.  

## 2017-04-21 ENCOUNTER — Other Ambulatory Visit: Payer: Self-pay

## 2017-04-21 ENCOUNTER — Other Ambulatory Visit: Payer: Self-pay | Admitting: Obstetrics and Gynecology

## 2017-04-21 ENCOUNTER — Encounter: Payer: Self-pay | Admitting: Obstetrics and Gynecology

## 2017-04-21 ENCOUNTER — Encounter (HOSPITAL_COMMUNITY)
Admission: RE | Admit: 2017-04-21 | Discharge: 2017-04-21 | Disposition: A | Discharge status: Home / Self Care | Payer: BLUE CROSS/BLUE SHIELD | Source: Ambulatory Visit | Attending: Obstetrics and Gynecology | Admitting: Obstetrics and Gynecology

## 2017-04-21 ENCOUNTER — Encounter (HOSPITAL_COMMUNITY): Payer: Self-pay

## 2017-04-21 ENCOUNTER — Ambulatory Visit: Payer: BLUE CROSS/BLUE SHIELD | Admitting: Obstetrics and Gynecology

## 2017-04-21 VITALS — BP 110/70 | HR 94 | Ht 63.0 in | Wt 111.0 lb

## 2017-04-21 DIAGNOSIS — Z01818 Encounter for other preprocedural examination: Secondary | ICD-10-CM | POA: Diagnosis not present

## 2017-04-21 DIAGNOSIS — Z113 Encounter for screening for infections with a predominantly sexual mode of transmission: Secondary | ICD-10-CM

## 2017-04-21 DIAGNOSIS — Z01812 Encounter for preprocedural laboratory examination: Secondary | ICD-10-CM | POA: Insufficient documentation

## 2017-04-21 DIAGNOSIS — D069 Carcinoma in situ of cervix, unspecified: Secondary | ICD-10-CM

## 2017-04-21 DIAGNOSIS — Z3202 Encounter for pregnancy test, result negative: Secondary | ICD-10-CM

## 2017-04-21 HISTORY — DX: Carcinoma in situ of cervix, unspecified: D06.9

## 2017-04-21 LAB — CBC
HEMATOCRIT: 39.8 % (ref 36.0–46.0)
Hemoglobin: 12.8 g/dL (ref 12.0–15.0)
MCH: 33.6 pg (ref 26.0–34.0)
MCHC: 32.2 g/dL (ref 30.0–36.0)
MCV: 104.5 fL — ABNORMAL HIGH (ref 78.0–100.0)
PLATELETS: 291 10*3/uL (ref 150–400)
RBC: 3.81 MIL/uL — ABNORMAL LOW (ref 3.87–5.11)
RDW: 12.8 % (ref 11.5–15.5)
WBC: 9.2 10*3/uL (ref 4.0–10.5)

## 2017-04-21 LAB — COMPREHENSIVE METABOLIC PANEL
ALBUMIN: 3.8 g/dL (ref 3.5–5.0)
ALT: 15 U/L (ref 14–54)
ANION GAP: 6 (ref 5–15)
AST: 23 U/L (ref 15–41)
Alkaline Phosphatase: 57 U/L (ref 38–126)
BILIRUBIN TOTAL: 0.5 mg/dL (ref 0.3–1.2)
BUN: 17 mg/dL (ref 6–20)
CO2: 28 mmol/L (ref 22–32)
Calcium: 9.3 mg/dL (ref 8.9–10.3)
Chloride: 105 mmol/L (ref 101–111)
Creatinine, Ser: 0.51 mg/dL (ref 0.44–1.00)
GFR calc Af Amer: >60 mL/min (ref >60)
GFR calc non Af Amer: >60 mL/min (ref >60)
GLUCOSE: 99 mg/dL (ref 65–99)
POTASSIUM: 3.4 mmol/L — AB (ref 3.5–5.1)
SODIUM: 139 mmol/L (ref 135–145)
TOTAL PROTEIN: 7.1 g/dL (ref 6.5–8.1)

## 2017-04-21 LAB — HCG, SERUM, QUALITATIVE: Preg, Serum: NEGATIVE

## 2017-04-21 LAB — POCT URINE PREGNANCY: Preg Test, Ur: NEGATIVE

## 2017-04-21 NOTE — Progress Notes (Signed)
Preoperative History and Physical  Dominique Taylor is a 41 y.o. G2P2 here for surgical management of HGSIL - encompassing moderate and severe dysplasia.   No significant preoperative concerns. HPV positive.   Pap on 02/15/17 Colpo Biopsy:  Moderate to severe dysplasia on biopsy, biopsies done 3 times before, snf patient has had a Leep in the past. ECC 03/17/17  Proposed surgery: CKC,  Further review of records, conization recommended  instead of a second LEEP   Past Medical History:  Diagnosis Date  . Abnormal Pap smear   . Breast mass, left   . Cancer ([place])    ovarian cervical ca  . Depression   . HPV (human papilloma virus) infection    Past Surgical History:  Procedure Laterality Date  . BREAST ENHANCEMENT SURGERY    . COLPOSCOPY    . RADIOACTIVE SEED GUIDED EXCISIONAL BREAST BIOPSY Left 06/21/2016   Procedure: LEFT BREAST RADIOACTIVE SEED GUIDED EXCISIONAL BIOPSY;  Surgeon: Rolm Bookbinder, MD;  Location: [facility];  Service: General;  Laterality: Left;  . TUBAL LIGATION     OB History  Gravida Para Term Preterm AB Living  2 2       2   SAB TAB Ectopic Multiple Live Births               # Outcome Date GA Lbr Len/2nd Weight Sex Delivery Anes PTL Lv  2 Para           1 Para             Patient denies any other pertinent gynecologic issues.   Current Outpatient Medications on File Prior to Visit  Medication Sig Dispense Refill  . escitalopram (LEXAPRO) 10 MG tablet Take 1 tablet (10 mg total) by mouth daily. 14 tablet 0  . metroNIDAZOLE (METROGEL) 0.75 % vaginal gel Place 1 Applicatorful vaginally at bedtime. Apply one applicatorful to vagina QOD after menses for recurrent BV 70 g 1  . Prenatal Vit-Fe Fumarate-FA (PRENATAL MULTIVITAMIN) TABS tablet Take 1 tablet by mouth daily at 12 noon.    . tamoxifen (NOLVADEX) 20 MG tablet TAKE 1 TABLET BY MOUTH ONCE DAILY 90 tablet 3  . venlafaxine XR (EFFEXOR-XR) 75 MG 24 hr capsule Take 2 capsules (150 mg total)  by mouth daily with breakfast. 28 capsule 0  . albuterol (PROVENTIL HFA;VENTOLIN HFA) 108 (90 BASE) MCG/ACT inhaler Inhale 2 puffs into the lungs every 6 (six) hours as needed for wheezing. (Patient not taking: Reported on 04/21/2017) 1 Inhaler 1   No current facility-administered medications on file prior to visit.    Allergies  Allergen Reactions  . Wellbutrin [Bupropion] Itching    Social History:   reports that she has been smoking cigarettes.  She has a 23.00 pack-year smoking history. she has never used smokeless tobacco. She reports that she does not drink alcohol or use drugs.  Family History  Problem Relation Age of Onset  . Arthritis Mother        RA  . Cancer Mother 91       cervical cancer   . Depression Father   . Hypertension Father   . Diabetes Father   . Ulcerative colitis Father   . Hypertension Brother     Review of Systems: Noncontributory  PHYSICAL EXAM: Blood pressure 110/70, pulse 94, height 5\' 3"  (1.6 m), weight 111 lb (50.3 kg), last menstrual period 04/10/2017. General appearance - alert, well appearing, and in no distress Chest - clear to auscultation, no  wheezes, rales or rhonchi, symmetric air entry Heart - normal rate and regular rhythm Abdomen - soft, nontender, nondistended, no masses or organomegaly               Pelvic exam:  VULVA: normal appearing vulva with no masses, tenderness or lesions,  VAGINA: normal appearing vagina with normal color and discharge, no lesions,  CERVIX: normal appearing cervix without discharge or lesions,  UTERUS: uterus is normal size, shape, consistency and nontender,  ADNEXA: normal adnexa in size, nontender and no masses. Extremities - peripheral pulses normal, no pedal edema, no clubbing or cyanosis  Labs: Results for orders placed or performed in visit on 04/21/17 (from the past 336 hour(s))  POCT urine pregnancy   Collection Time: 04/21/17 10:17 AM  Result Value Ref Range   Preg Test, Ur Negative Negative     Imaging Studies: No results found.  Assessment: Patient Active Problem List   Diagnosis Date Noted  . Long-term current use of tamoxifen 01/09/2017  . Night sweats 01/09/2017  . Atypical lobular hyperplasia (ALH) of left breast 07/29/2016  . Depression with anxiety 02/29/2016  . Right carotid bruit 02/24/2015  . Face lesion 07/24/2014  . Tobacco use disorder 07/24/2014  . Smoker 06/13/2014  . Lymphadenopathy of right cervical region 06/13/2014  . Abnormal Pap smear of cervix 06/03/2013    Plan: Patient will undergo surgical management with CKC on 04/25/17   .mec 04/21/2017 10:42 AM   By signing my name below, I, Izna Ahmed, attest that this documentation has been prepared under the direction and in the presence of Jonnie Kind, MD. Electronically Signed: Jabier Gauss, Medical Scribe. 04/21/17. 10:42 AM.  I personally performed the services described in this documentation, which was SCRIBED in my presence. The recorded information has been reviewed and considered accurate. It has been edited as necessary during review. Jonnie Kind, MD

## 2017-04-21 NOTE — Patient Instructions (Signed)
Dominique Taylor  04/21/2017     @PREFPERIOPPHARMACY @   Your procedure is scheduled on  04/25/2017 .  Report to [facility] at  1010  A.M.  Call this number if you have problems the morning of surgery:  [phone]   Remember:  Do not eat food or drink liquids after midnight.  Take these medicines the morning of surgery with A SIP OF WATER  Lexapro, effexor.   Do not wear jewelry, make-up or nail polish.  Do not wear lotions, powders, or perfumes, or deoderant.  Do not shave 48 hours prior to surgery.  Men may shave face and neck.  Do not bring valuables to the hospital.  [facility] is not responsible for any belongings or valuables.  Contacts, dentures or bridgework may not be worn into surgery.  Leave your suitcase in the car.  After surgery it may be brought to your room.  For patients admitted to the hospital, discharge time will be determined by your treatment team.  Patients discharged the day of surgery will not be allowed to drive home.   Name and phone number of your driver:   family Special instructions:  None  Please read over the following fact sheets that you were given. Anesthesia Post-op Instructions and Care and Recovery After Surgery       Cervical Conization Cervical conization (cone biopsy) is a procedure in which a cone-shaped portion of the cervix is cut out so that it can be examined under a microscope. The procedure is done to check for cancer cells or cells that might turn into cancer (precancerous cells). You may have this procedure if:  You have abnormal bleeding from your cervix.  You had an abnormal Pap test.  Something abnormal was seen on your cervix during an exam.  This procedure is performed in either a health care provider's office or in an operating room. Tell a health care provider about:  Any allergies you have.  All medicines you are taking, including vitamins, herbs, eye drops, creams, and over-the-counter  medicines.  Any problems you or family members have had with the use of anesthetic medicines.  Any blood disorders you have.  Any surgeries you have had.  Any medical conditions you have.  Your smoking habits.  When you normally have your period.  Whether you are pregnant or may be pregnant. What are the risks? Generally, this is a safe procedure. However, problems may occur, including:  Heavy bleeding for several days or weeks after the procedure.  Allergic reactions to medicines or dyes.  Increased risk of preterm labor in future pregnancies.  Infection (rare).  Damage to the cervix or other structures or organs (rare).  What happens before the procedure? Staying hydrated Follow instructions from your health care provider about hydration, which may include:  Up to 2 hours before the procedure - you may continue to drink clear liquids, such as water, clear fruit juice, black coffee, and plain tea.  Eating and drinking restrictions Follow instructions from your health care provider about eating and drinking, which may include:  8 hours before the procedure - stop eating heavy meals or foods such as meat, fried foods, or fatty foods.  6 hours before the procedure - stop eating light meals or foods, such as toast or cereal.  6 hours before the procedure - stop drinking milk or drinks that contain milk.  2 hours before the procedure - stop drinking  clear liquids.  General instructions  Do not douche, have sex, use tampons, or use any vaginal medicines before the procedure as told by your health care provider.  You may be asked to empty your bladder and bowel right before the procedure.  Ask your health care provider about: ? Changing or stopping your normal medicines. This is important if you take diabetes medicines or blood thinners. ? Taking medicines such as aspirin and ibuprofen. These medicines can thin your blood. Do not take these medicines before your  procedure if your doctor tells you not to.  Plan to have someone take you home from the hospital or clinic. What happens during the procedure?  To reduce your risk of infection: ? Your health care team will wash or sanitize their hands. ? Your skin will be washed with soap. ? Hair may be removed from the surgical area.  You will undress from the waist down and be given a gown to wear.  You will lie on an examining table and put your feet in stirrups.  An IV tube will be inserted into one of your veins.  You will be given one or more of the following: ? A medicine to help you relax (sedative). ? A medicine to numb the area (local anesthetic). ? A medicine to make you fall asleep (general anesthetic). ? A medicine that numbs the cervix (cervical block).  A lubricated device called a speculum will be inserted into your vagina. It will be used to spread open the walls of the vagina so your health care provider can see the inside of the vagina and cervix better.  An instrument that has a magnifying lens and a light (colposcope) will let your health care provider examine the cervix more closely.  Your health care provider will apply a solution to your cervix. This turns abnormal areas a pale color.  A tissue sample will be removed from the cervix using one of the following methods: ? The cold knife method. In this method, the tissue is cut out with a knife (scalpel). ? The loop electrosurgical excision procedure (LEEP) method. In this method, the tissue is cut out with a thin wire that can burn (cauterize) the tissue with an electrical current. ? Laser treatment method. In this method, the tissue is cut out and then cauterized with a laser beam to prevent bleeding.  Your health care provider will apply a paste over the biopsy areas to help control bleeding.  The tissue sample will be examined under a microscope. The procedure may vary among health care providers and hospitals. What  happens after the procedure?  Your blood pressure, heart rate, breathing rate, and blood oxygen level will be monitored often until the medicines you were given have worn off.  If you were given a local anesthetic, you will rest at the clinic or hospital until you are stable and feel ready to go home.  If you were given a general anesthetic, you may be monitored for a longer period of time.  You may have some cramping.  You may have bloody discharge or light to moderate bleeding.  You may have dark discharge coming from your vagina. This is from the paste used on the cervix to prevent bleeding. Summary  Cervical conization is a procedure in which a cone-shaped portion of the cervix is cut out so that it can be examined under a microscope.  The procedure is done to check for cancer cells or cells that might turn into  cancer (precancerous cells). This information is not intended to replace advice given to you by your health care provider. Make sure you discuss any questions you have with your health care provider. Document Released: 02/09/2005 Document Revised: 05/04/2016 Document Reviewed: 05/04/2016 Elsevier Interactive Patient Education  2017 [employer] Cervical Conization, Care After This sheet gives you information about how to care for yourself after your procedure. Your health care provider may also give you more specific instructions. If you have problems or questions, contact your health care provider. What can I expect after the procedure? After the procedure, it is common to have:  A groggy feeling, if you were given medicine to make you fall asleep (general anesthetic).  Cramps that feel similar to menstrual cramps.  Bloody discharge or light to moderate bleeding.  Dark discharge. This discharge may look similar to coffee grounds. This is from the paste that was applied to the cervix to control bleeding.  Follow these instructions at home: Medicines  Take  over-the-counter and prescription medicines only as told by your health care provider.  Do not take aspirin until your health care provider says it is okay. Aspirin can cause bleeding.  If you are taking pain medicine: ? You may need to prevent or treat constipation. To do this, your health care provider may recommend that you:  Drink enough fluid to keep your urine clear or pale yellow.  Take over-the-counter or prescription medicines.  Eat foods that are high in fiber, such as fresh fruits and vegetables, whole grains, bran, and beans.  Limit foods that are high in fat and processed sugars, such as fried and sweet foods. ? Do not drive or use heavy machinery. General instructions  You may resume your normal diet unless your health care provider advises you not to do so.  Take showers for the first week. Do not take baths, swim, or use hot tubs until your health care provider says it is okay.  Do not douche, use tampons, or have sex until your health care provider says it is okay.  Avoid activities that require great effort, such as exercises and heavy lifting, for at least 7-14 days.  Keep all follow-up visits as told by your health care provider. This is important. Contact a health care provider if:  You develop a rash.  You are dizzy or lightheaded.  You feel nauseous or you vomit.  You develop a bad smelling discharge from your vagina. Get help right away if:  You have blood clots or bleeding that is heavier than a normal period. Bleeding that soaks a pad in less than 1 hour is considered heavy bleeding.  You have a fever.  You have increasing cramps.  You faint.  You have pain when you urinate.  You have severe or worsening pain.  Your pain is not relieved when you take medicine.  You have bloody urine.  You vomit. Summary  After the procedure, it is common to have cramps and dark or bloody discharge from your vagina.  Do not douche, use tampons, or  have sex until your health care provider says it is okay.  Follow all other activity restrictions as told by your health care provider. This information is not intended to replace advice given to you by your health care provider. Make sure you discuss any questions you have with your health care provider. Document Released: 05/02/2005 Document Revised: 05/04/2016 Document Reviewed: 05/04/2016 Elsevier Interactive Patient Education  2017 [employer] Anesthesia, Adult General  anesthesia is the use of medicines to make a person "go to sleep" (be unconscious) for a medical procedure. General anesthesia is often recommended when a procedure:  Is long.  Requires you to be still or in an unusual position.  Is major and can cause you to lose blood.  Is impossible to do without general anesthesia.  The medicines used for general anesthesia are called general anesthetics. In addition to making you sleep, the medicines:  Prevent pain.  Control your blood pressure.  Relax your muscles.  Tell a health care provider about:  Any allergies you have.  All medicines you are taking, including vitamins, herbs, eye drops, creams, and over-the-counter medicines.  Any problems you or family members have had with anesthetic medicines.  Types of anesthetics you have had in the past.  Any bleeding disorders you have.  Any surgeries you have had.  Any medical conditions you have.  Any history of heart or lung conditions, such as heart failure, sleep apnea, or chronic obstructive pulmonary disease (COPD).  Whether you are pregnant or may be pregnant.  Whether you use tobacco, alcohol, marijuana, or street drugs.  Any history of Armed forces logistics/support/administrative officer.  Any history of depression or anxiety. What are the risks? Generally, this is a safe procedure. However, problems may occur, including:  Allergic reaction to anesthetics.  Lung and heart problems.  Inhaling food or liquids from your  stomach into your lungs (aspiration).  Injury to nerves.  Waking up during your procedure and being unable to move (rare).  Extreme agitation or a state of mental confusion (delirium) when you wake up from the anesthetic.  Air in the bloodstream, which can lead to stroke.  These problems are more likely to develop if you are having a major surgery or if you have an advanced medical condition. You can prevent some of these complications by answering all of your health care provider's questions thoroughly and by following all pre-procedure instructions. General anesthesia can cause side effects, including:  Nausea or vomiting  A sore throat from the breathing tube.  Feeling cold or shivery.  Feeling tired, washed out, or achy.  Sleepiness or drowsiness.  Confusion or agitation.  What happens before the procedure? Staying hydrated Follow instructions from your health care provider about hydration, which may include:  Up to 2 hours before the procedure - you may continue to drink clear liquids, such as water, clear fruit juice, black coffee, and plain tea.  Eating and drinking restrictions Follow instructions from your health care provider about eating and drinking, which may include:  8 hours before the procedure - stop eating heavy meals or foods such as meat, fried foods, or fatty foods.  6 hours before the procedure - stop eating light meals or foods, such as toast or cereal.  6 hours before the procedure - stop drinking milk or drinks that contain milk.  2 hours before the procedure - stop drinking clear liquids.  Medicines  Ask your health care provider about: ? Changing or stopping your regular medicines. This is especially important if you are taking diabetes medicines or blood thinners. ? Taking medicines such as aspirin and ibuprofen. These medicines can thin your blood. Do not take these medicines before your procedure if your health care provider instructs you  not to. ? Taking new dietary supplements or medicines. Do not take these during the week before your procedure unless your health care provider approves them.  If you are told to take a medicine  or to continue taking a medicine on the day of the procedure, take the medicine with sips of water. General instructions   Ask if you will be going home the same day, the following day, or after a longer hospital stay. ? Plan to have someone take you home. ? Plan to have someone stay with you for the first 24 hours after you leave the hospital or clinic.  For 3-6 weeks before the procedure, try not to use any tobacco products, such as cigarettes, chewing tobacco, and e-cigarettes.  You may brush your teeth on the morning of the procedure, but make sure to spit out the toothpaste. What happens during the procedure?  You will be given anesthetics through a mask and through an IV tube in one of your veins.  You may receive medicine to help you relax (sedative).  As soon as you are asleep, a breathing tube may be used to help you breathe.  An anesthesia specialist will stay with you throughout the procedure. He or she will help keep you comfortable and safe by continuing to give you medicines and adjusting the amount of medicine that you get. He or she will also watch your blood pressure, pulse, and oxygen levels to make sure that the anesthetics do not cause any problems.  If a breathing tube was used to help you breathe, it will be removed before you wake up. The procedure may vary among health care providers and hospitals. What happens after the procedure?  You will wake up, often slowly, after the procedure is complete, usually in a recovery area.  Your blood pressure, heart rate, breathing rate, and blood oxygen level will be monitored until the medicines you were given have worn off.  You may be given medicine to help you calm down if you feel anxious or agitated.  If you will be going  home the same day, your health care provider may check to make sure you can stand, drink, and urinate.  Your health care providers will treat your pain and side effects before you go home.  Do not drive for 24 hours if you received a sedative.  You may: ? Feel nauseous and vomit. ? Have a sore throat. ? Have mental slowness. ? Feel cold or shivery. ? Feel sleepy. ? Feel tired. ? Feel sore or achy, even in parts of your body where you did not have surgery. This information is not intended to replace advice given to you by your health care provider. Make sure you discuss any questions you have with your health care provider. Document Released: 08/09/2007 Document Revised: 10/13/2015 Document Reviewed: 04/16/2015 Elsevier Interactive Patient Education  2018 [employer] Anesthesia, Adult, Care After These instructions provide you with information about caring for yourself after your procedure. Your health care provider may also give you more specific instructions. Your treatment has been planned according to current medical practices, but problems sometimes occur. Call your health care provider if you have any problems or questions after your procedure. What can I expect after the procedure? After the procedure, it is common to have:  Vomiting.  A sore throat.  Mental slowness.  It is common to feel:  Nauseous.  Cold or shivery.  Sleepy.  Tired.  Sore or achy, even in parts of your body where you did not have surgery.  Follow these instructions at home: For at least 24 hours after the procedure:  Do not: ? Participate in activities where you could fall or become  injured. ? Drive. ? Use heavy machinery. ? Drink alcohol. ? Take sleeping pills or medicines that cause drowsiness. ? Make important decisions or sign legal documents. ? Take care of children on your own.  Rest. Eating and drinking  If you vomit, drink water, juice, or soup when you can drink  without vomiting.  Drink enough fluid to keep your urine clear or pale yellow.  Make sure you have little or no nausea before eating solid foods.  Follow the diet recommended by your health care provider. General instructions  Have a responsible adult stay with you until you are awake and alert.  Return to your normal activities as told by your health care provider. Ask your health care provider what activities are safe for you.  Take over-the-counter and prescription medicines only as told by your health care provider.  If you smoke, do not smoke without supervision.  Keep all follow-up visits as told by your health care provider. This is important. Contact a health care provider if:  You continue to have nausea or vomiting at home, and medicines are not helpful.  You cannot drink fluids or start eating again.  You cannot urinate after 8-12 hours.  You develop a skin rash.  You have fever.  You have increasing redness at the site of your procedure. Get help right away if:  You have difficulty breathing.  You have chest pain.  You have unexpected bleeding.  You feel that you are having a life-threatening or urgent problem. This information is not intended to replace advice given to you by your health care provider. Make sure you discuss any questions you have with your health care provider. Document Released: 08/08/2000 Document Revised: 10/05/2015 Document Reviewed: 04/16/2015 Elsevier Interactive Patient Education  [employer].

## 2017-04-25 LAB — GC/CHLAMYDIA PROBE AMP
Chlamydia trachomatis, NAA: NEGATIVE
Neisseria gonorrhoeae by PCR: NEGATIVE

## 2017-04-25 NOTE — H&P (Addendum)
Preoperative History and Physical  Dominique Taylor is a 41 y.o. G2P2 here for surgical management of HGSIL - encompassing moderate and severe dysplasia. She is currently on the third day of her menstrual cycle. No significant preoperative concerns. HPV positive. Smokes about 1 pack per day. She is interested in trying nicotine patches to quit smoking.   Per chart review: Papon 02/15/17 Colpo Biopsy: Moderate to severe dysplasia on biopsy, biopsies done 3 times before, snf patient has had a Leep in the past. ECC11/02/18 rOS : PT finds menstrual discomforts of the first 2 days of cycle debilitating, worse since she switched from OCP to tubal ligation.  Proposed surgery: Vaginal Hysterectomy on 05/30/2017      Past Medical History:  Diagnosis Date  . Abnormal Pap smear   . Breast mass, left   . Cancer [facility])    breast cancer  . Depression   . HPV (human papilloma virus) infection         Past Surgical History:  Procedure Laterality Date  . BREAST ENHANCEMENT SURGERY    . COLPOSCOPY    . RADIOACTIVE SEED GUIDED EXCISIONAL BREAST BIOPSY Left 06/21/2016   Procedure: LEFT BREAST RADIOACTIVE SEED GUIDED EXCISIONAL BIOPSY;  Surgeon: Rolm Bookbinder, MD;  Location: [place];  Service: General;  Laterality: Left;  . TUBAL LIGATION                     OB History  Gravida Para Term Preterm AB Living  2 2       2   SAB TAB Ectopic Multiple Live Births               # Outcome Date GA Lbr Len/2nd Weight Sex Delivery Anes PTL Lv  2 Para           1 Para             Patient denies any other pertinent gynecologic issues.         Current Outpatient Medications on File Prior to Visit  Medication Sig Dispense Refill  . albuterol (PROVENTIL HFA;VENTOLIN HFA) 108 (90 Base) MCG/ACT inhaler Inhale 2 puffs into the lungs every 6 (six) hours as needed for wheezing. 1 Inhaler 1  . escitalopram (LEXAPRO) 20 MG tablet Take 1 tablet  (20 mg total) by mouth daily. 90 tablet 1  . Prenatal Vit-Fe Fumarate-FA (PRENATAL MULTIVITAMIN) TABS tablet Take 1 tablet by mouth daily at 12 noon.    . tamoxifen (NOLVADEX) 20 MG tablet TAKE 1 TABLET BY MOUTH ONCE DAILY (Patient taking differently: TAKE 20 MG BY MOUTH ONCE DAILY AT BEDTIME) 90 tablet 3  . venlafaxine XR (EFFEXOR-XR) 150 MG 24 hr capsule Take 1 capsule (150 mg total) by mouth daily with breakfast. 90 capsule 1  . metroNIDAZOLE (METROGEL) 0.75 % vaginal gel Place 1 Applicatorful vaginally at bedtime. Apply one applicatorful to vagina QOD after menses for recurrent BV (Patient not taking: Reported on 05/24/2017) 70 g 1  . predniSONE (DELTASONE) 50 MG tablet Take 1 tablet (50 mg total) by mouth daily with breakfast. (Patient not taking: Reported on 05/24/2017) 5 tablet 0   No current facility-administered medications on file prior to visit.        Allergies  Allergen Reactions  . Wellbutrin [Bupropion] Itching    Social History:   reports that she has been smoking cigarettes.  She has a 23.00 pack-year smoking history. she has never used smokeless tobacco. She reports that she  does not drink alcohol or use drugs.       Family History  Problem Relation Age of Onset  . Arthritis Mother        RA  . Cancer Mother 37       cervical cancer   . Depression Father   . Hypertension Father   . Diabetes Father   . Ulcerative colitis Father   . Hypertension Brother     Review of Systems: Noncontributory  PHYSICAL EXAM: Blood pressure 98/60, pulse 80, height 5' 3.5" (1.613 m), weight 114 lb (51.7 kg), last menstrual period 05/22/2017. General appearance - alert, well appearing, and in no distress Chest - clear to auscultation, no wheezes, rales or rhonchi, symmetric air entry Heart - normal rate and regular rhythm Abdomen - soft, nontender, nondistended, no masses or organomegaly Pelvic - examination             Good support             Vaginal side wall  obstetric laceration with full healing at 9 o'clock, assymptomatic             Uterus - posterior retroverted  Sounds to 9 cm at prior biopsy Extremities - peripheral pulses normal, no pedal edema, no clubbing or cyanosis  .    Labs: CBC    Component Value Date/Time   WBC 9.2 05/29/2017 1028   RBC 3.98 05/29/2017 1028   HGB 13.7 05/29/2017 1028   HGB 13.4 12/05/2016 1517   HCT 41.8 05/29/2017 1028   HCT 39.6 12/05/2016 1517   PLT 255 05/29/2017 1028   PLT 265 12/05/2016 1517   MCV 105.0 (H) 05/29/2017 1028   MCV 102.8 (H) 12/05/2016 1517   MCH 34.4 (H) 05/29/2017 1028   MCHC 32.8 05/29/2017 1028   RDW 13.0 05/29/2017 1028   RDW 13.4 12/05/2016 1517   LYMPHSABS 2.4 12/05/2016 1517   MONOABS 0.6 12/05/2016 1517   EOSABS 0.3 12/05/2016 1517   BASOSABS 0.1 12/05/2016 1517    CMP Latest Ref Rng & Units 05/29/2017 04/21/2017 12/05/2016  Glucose 65 - 99 mg/dL 90 99 96  BUN 6 - 20 mg/dL 17 17 19.2  Creatinine 0.44 - 1.00 mg/dL 0.51 0.51 0.7  Sodium 135 - 145 mmol/L 137 139 140  Potassium 3.5 - 5.1 mmol/L 3.9 3.4(L) 4.1  Chloride 101 - 111 mmol/L 101 105 -  CO2 22 - 32 mmol/L 24 28 23   Calcium 8.9 - 10.3 mg/dL 9.3 9.3 8.9  Total Protein 6.5 - 8.1 g/dL 6.7 7.1 6.5  Total Bilirubin 0.3 - 1.2 mg/dL 0.3 0.5 <0.22  Alkaline Phos 38 - 126 U/L 53 57 52  AST 15 - 41 U/L 21 23 19   ALT 14 - 54 U/L 16 15 13       Imaging Studies: ImagingResults  No results found.    Discussion of risks/benefits of hysterectomy as noted below:  At end of discussion, pt had opportunity to ask questions and has no further questions at this time.   Specific discussion of hysterectomy as noted above. Greater than 50% was spent in counseling and coordination of care with the patient.   Total time greater than: 15 minutes.     Assessment: 1.  Persistent CIN II-III, on cervical biopsy, s/p Leep. 2. Dysmenorrhea  1. Smoker 1 ppd     Patient Active Problem List   Diagnosis Date Noted   . Bronchitis 04/28/2017  . CIN II-III (cervical intraepithelial neoplasia grade III) with severe dysplasia  04/21/2017  . Long-term current use of tamoxifen 01/09/2017  . Night sweats 01/09/2017  . Atypical lobular hyperplasia (ALH) of left breast 07/29/2016  . Depression with anxiety 02/29/2016  . Right carotid bruit 02/24/2015  . Face lesion 07/24/2014  . Tobacco use disorder 07/24/2014    Plan: 1. Patient will undergo surgical management with Vaginal Hysterectomy. Pro and con of removal of tubes discussed, with decreased risk of tube/ovarian cancer of 0.5% versus increased risk of adhesions. Pt declines salpingectomy. 2. Will start Nicoderm patches, pt to reduce cigarette use preop.   Jonnie Kind, MD    By signing my name below, I, Margit Banda, attest that this documentation has been prepared under the direction and in the presence of Jonnie Kind, MD. Electronically Signed: Margit Banda, Medical Scribe. 05/24/17. 9:29 AM.  I personally performed the services described in this documentation, which was SCRIBED in my presence. The recorded information has been reviewed and considered accurate. It has been edited as necessary during review. Jonnie Kind, MD

## 2017-04-28 ENCOUNTER — Ambulatory Visit: Payer: BLUE CROSS/BLUE SHIELD | Admitting: Family Medicine

## 2017-04-28 ENCOUNTER — Encounter: Payer: Self-pay | Admitting: Family Medicine

## 2017-04-28 VITALS — BP 110/71 | HR 76 | Temp 98.6°F | Wt 114.0 lb

## 2017-04-28 DIAGNOSIS — R61 Generalized hyperhidrosis: Secondary | ICD-10-CM | POA: Diagnosis not present

## 2017-04-28 DIAGNOSIS — F418 Other specified anxiety disorders: Secondary | ICD-10-CM | POA: Diagnosis not present

## 2017-04-28 DIAGNOSIS — F17200 Nicotine dependence, unspecified, uncomplicated: Secondary | ICD-10-CM

## 2017-04-28 DIAGNOSIS — R062 Wheezing: Secondary | ICD-10-CM

## 2017-04-28 DIAGNOSIS — Z79810 Long term (current) use of selective estrogen receptor modulators (SERMs): Secondary | ICD-10-CM

## 2017-04-28 DIAGNOSIS — J40 Bronchitis, not specified as acute or chronic: Secondary | ICD-10-CM

## 2017-04-28 MED ORDER — IPRATROPIUM-ALBUTEROL 0.5-2.5 (3) MG/3ML IN SOLN
3.0000 mL | Freq: Once | RESPIRATORY_TRACT | Status: AC
  Administered 2017-04-28: 3 mL via RESPIRATORY_TRACT

## 2017-04-28 MED ORDER — ESCITALOPRAM OXALATE 20 MG PO TABS: 20.0000 mg | ORAL_TABLET | Freq: Every day | ORAL | 1 refills | Status: DC

## 2017-04-28 MED ORDER — VENLAFAXINE HCL ER 150 MG PO CP24: 150.0000 mg | ORAL_CAPSULE | Freq: Every day | ORAL | 1 refills | Status: DC

## 2017-04-28 MED ORDER — PREDNISONE 50 MG PO TABS: 50.0000 mg | ORAL_TABLET | Freq: Every day | ORAL | 0 refills | Status: DC

## 2017-04-28 MED ORDER — AZITHROMYCIN 250 MG PO TABS: ORAL_TABLET | ORAL | 0 refills | Status: DC

## 2017-04-28 MED ORDER — BENZONATATE 200 MG PO CAPS: 200.0000 mg | ORAL_CAPSULE | Freq: Two times a day (BID) | ORAL | 0 refills | Status: DC | PRN

## 2017-04-28 MED ORDER — ALBUTEROL SULFATE HFA 108 (90 BASE) MCG/ACT IN AERS: 2.0000 | INHALATION_SPRAY | Freq: Four times a day (QID) | RESPIRATORY_TRACT | 1 refills | Status: DC | PRN

## 2017-04-28 NOTE — Patient Instructions (Addendum)
Refills on Effexor and lexapro (take one tab of each daily). Followup in 6 months unless needed sooner.   Bronchitis: - please try to quit smoking, this would help you tremendously.  Tessalon perles for cough prescribed.  Azithromycin prescribed. Prednisone prescribed.  Albuterol inhaler refilled. Use only as needed, should not need more than > 2x/wk when not sick or need to be seen.     Chronic Obstructive Pulmonary Disease Chronic obstructive pulmonary disease (COPD) is a long-term (chronic) lung problem. When you have COPD, it is hard for air to get in and out of your lungs. The way your lungs work will never return to normal. Usually the condition gets worse over time. There are things you can do to keep yourself as healthy as possible. Your doctor may treat your condition with:  Medicines.  Quitting smoking, if you smoke.  Rehabilitation. This may involve a team of specialists.  Oxygen.  Exercise and changes to your diet.  Lung surgery.  Comfort measures (palliative care).  Follow these instructions at home: Medicines  Take over-the-counter and prescription medicines only as told by your doctor.  Talk to your doctor before taking any cough or allergy medicines. You may need to avoid medicines that cause your lungs to be dry. Lifestyle  If you smoke, stop. Smoking makes the problem worse. If you need help quitting, ask your doctor.  Avoid being around things that make your breathing worse. This may include smoke, chemicals, and fumes.  Stay active, but remember to also rest.  Learn and use tips on how to relax.  Make sure you get enough sleep. Most adults need at least 7 hours a night.  Eat healthy foods. Eat smaller meals more often. Rest before meals. Controlled breathing  Learn and use tips on how to control your breathing as told by your doctor. Try: ? Breathing in (inhaling) through your nose for 1 second. Then, pucker your lips and breath out (exhale)  through your lips for 2 seconds. ? Putting one hand on your belly (abdomen). Breathe in slowly through your nose for 1 second. Your hand on your belly should move out. Pucker your lips and breathe out slowly through your lips. Your hand on your belly should move in as you breathe out. Controlled coughing  Learn and use controlled coughing to clear mucus from your lungs. The steps are: 1. Lean your head a little forward. 2. Breathe in deeply. 3. Try to hold your breath for 3 seconds. 4. Keep your mouth slightly open while coughing 2 times. 5. Spit any mucus out into a tissue. 6. Rest and do the steps again 1 or 2 times as needed. General instructions  Make sure you get all the shots (vaccines) that your doctor recommends. Ask your doctor about a flu shot and a pneumonia shot.  Use oxygen therapy and therapy to help improve your lungs (pulmonary rehabilitation) if told by your doctor. If you need home oxygen therapy, ask your doctor if you should buy a tool to measure your oxygen level (oximeter).  Make a COPD action plan with your doctor. This helps you know what to do if you feel worse than usual.  Manage any other conditions you have as told by your doctor.  Avoid going outside when it is very hot, cold, or humid.  Avoid people who have a sickness you can catch (contagious).  Keep all follow-up visits as told by your doctor. This is important. Contact a doctor if:  You cough up  more mucus than usual.  There is a change in the color or thickness of the mucus.  It is harder to breathe than usual.  Your breathing is faster than usual.  You have trouble sleeping.  You need to use your medicines more often than usual.  You have trouble doing your normal activities such as getting dressed or walking around the house. Get help right away if:  You have shortness of breath while resting.  You have shortness of breath that stops you from: ? Being able to talk. ? Doing normal  activities.  Your chest hurts for longer than 5 minutes.  Your skin color is more blue than usual.  Your pulse oximeter shows that you have low oxygen for longer than 5 minutes.  You have a fever.  You feel too tired to breathe normally. Summary  Chronic obstructive pulmonary disease (COPD) is a long-term lung problem.  The way your lungs work will never return to normal. Usually the condition gets worse over time. There are things you can do to keep yourself as healthy as possible.  Take over-the-counter and prescription medicines only as told by your doctor.  If you smoke, stop. Smoking makes the problem worse. This information is not intended to replace advice given to you by your health care provider. Make sure you discuss any questions you have with your health care provider. Document Released: 10/19/2007 Document Revised: 10/08/2015 Document Reviewed: 12/27/2012 Elsevier Interactive Patient Education  2017 [employer].

## 2017-04-28 NOTE — Progress Notes (Signed)
Dominique Taylor , 1976/02/10, 41 y.o., female MRN: [id number] Patient Care Team    Relationship Specialty Notifications Start End  Ma Hillock, DO PCP - General Family Medicine  02/29/16   Rolm Bookbinder, MD Consulting Physician General Surgery  07/29/16     Chief Complaint  Patient presents with  . Depression    meds refill    Subjective:   Cough: Patient reports she had an upper respiratory infection little over a week ago. She feels like she is getting over the initial illness but has been unable to shake the cough or the phlegm production. She is asking for an albuterol inhaler today, because it has helped her in the past with illnesses. She is in every day smoker. She denies any fever, chills, nausea, vomiting or diarrhea. She has tried his mucinex sinus which helped a small fraction. She does endorse wheezing, denies shortness of breath.  depression and anxiety: Patient presents today for follow-up on depression and anxiety. She reports that she is tolerating the Effexor 150 mg daily in the Lexapro. She states that she did go ahead and increase her Lexapro to 20 mg daily about one week ago. She feels that the doses are helping with her anxiety, however she feels she needs more coverage she's having increased irritability. She is going through quite a bit of medical health issues with diagnosis of breast cancer on tamoxifen and cervical dysplasia.  Prior note 02/01/2017:  Patient presents today for follow-up on her depression and anxiety after starting Effexor 75 mg daily 4 weeks ago. Her depression screening has improved from 5-0 her anxiety screening has remained approximately the same. Her hot flashes, possibly secondary to tamoxifen use, are unchanged. She states she does note that she is less irritable on the medication, however feel she could use an increased dose. She did decrease her Lexapro to 10 mg daily while tapering up on the Effexor. Prior note  01/04/2017 Patient is doing okay on leapro 20 mg daily. She feels she is more irritable, short tempered been normal. She also states she has night sweats, she is on tamoxifen secondary to fairly recent diagnosis of atypical lobular hyperplasia of her left breast. She thought she was handling the anxiety well, but being short tempered she is wondering if additional medication is not needed.   Mood disorder: negative 02/29/2016 Depression screen [facility] 2/9 02/15/2017 02/01/2017 01/04/2017  Decreased Interest 0 0 1  Down, Depressed, Hopeless 0 0 0  PHQ - 2 Score 0 0 1  Altered sleeping - 0 0  Tired, decreased energy - 0 3  Change in appetite - 0 1  Feeling bad or failure about yourself  - 0 0  Trouble concentrating - 0 0  Moving slowly or fidgety/restless - 0 0  Suicidal thoughts - 0 0  PHQ-9 Score - 0 5  Difficult doing work/chores - Not difficult at all -   GAD 7 : Generalized Anxiety Score 04/28/2017 02/01/2017 02/29/2016  Nervous, Anxious, on Edge 1 2 3   Control/stop worrying 3 3 3   Worry too much - different things 3 3 2   Trouble relaxing 3 2 2   Restless 0 3 1  Easily annoyed or irritable 0 2 3  Afraid - awful might happen 0 2 2  Total GAD 7 Score 10 17 16   Anxiety Difficulty Not difficult at all Somewhat difficult -    Allergies  Allergen Reactions  . Wellbutrin [Bupropion] Itching   Social History   Tobacco  Use  . Smoking status: Current Every Day Smoker    Packs/day: 1.00    Years: 23.00    Pack years: 23.00    Types: Cigarettes  . Smokeless tobacco: Never Used  Substance Use Topics  . Alcohol use: No   Past Medical History:  Diagnosis Date  . Abnormal Pap smear   . Breast mass, left   . Cancer [facility])    breast cancer  . Depression   . HPV (human papilloma virus) infection    Past Surgical History:  Procedure Laterality Date  . BREAST ENHANCEMENT SURGERY    . COLPOSCOPY    . RADIOACTIVE SEED GUIDED EXCISIONAL BREAST BIOPSY Left 06/21/2016   Procedure: LEFT  BREAST RADIOACTIVE SEED GUIDED EXCISIONAL BIOPSY;  Surgeon: Rolm Bookbinder, MD;  Location: [place];  Service: General;  Laterality: Left;  . TUBAL LIGATION     Family History  Problem Relation Age of Onset  . Arthritis Mother        RA  . Cancer Mother 64       cervical cancer   . Depression Father   . Hypertension Father   . Diabetes Father   . Ulcerative colitis Father   . Hypertension Brother    Allergies as of 04/28/2017      Reactions   Wellbutrin [bupropion] Itching      Medication List        Accurate as of 04/28/17  4:14 PM. Always use your most recent med list.          albuterol 108 (90 Base) MCG/ACT inhaler Commonly known as:  PROVENTIL HFA;VENTOLIN HFA Inhale 2 puffs into the lungs every 6 (six) hours as needed for wheezing.   escitalopram 10 MG tablet Commonly known as:  LEXAPRO Take 1 tablet (10 mg total) by mouth daily.   metroNIDAZOLE 0.75 % vaginal gel Commonly known as:  METROGEL Place 1 Applicatorful vaginally at bedtime. Apply one applicatorful to vagina QOD after menses for recurrent BV   prenatal multivitamin Tabs tablet Take 1 tablet by mouth daily at 12 noon.   tamoxifen 20 MG tablet Commonly known as:  NOLVADEX TAKE 1 TABLET BY MOUTH ONCE DAILY   venlafaxine XR 75 MG 24 hr capsule Commonly known as:  EFFEXOR-XR Take 2 capsules (150 mg total) by mouth daily with breakfast.       No results found for this or any previous visit (from the past 24 hour(s)). No results found.   ROS: Negative, with the exception of above mentioned in HPI   Objective:  BP 110/71 (BP Location: Left Arm, Patient Position: Sitting, Cuff Size: Normal)   Pulse 76   Temp 98.6 F (37 C) (Oral)   Wt 114 lb (51.7 kg)   LMP 04/10/2017   SpO2 97%   BMI 20.19 kg/m  Body mass index is 20.19 kg/m. Gen: Afebrile. No acute distress. Nontoxic in appearance, well-developed, well-nourished, Caucasian female. Cough present. HENT: AT. [place].  Bilateral TM visualized and normal in appearance. MMM. Bilateral nares without erythema or drainage. Throat without erythema or exudates. Cough present. Eyes:Pupils Equal Round Reactive to light, Extraocular movements intact,  Conjunctiva without redness, discharge or icterus. Neck/lymp/endocrine: Supple, no lymphadenopathy CV: RRR no murmur, no edema, +2/4 P posterior tibialis pulses Chest: Left lower lobe wheezing present, otherwise clear to auscultation bilaterally, good air movement. Neuro:  Normal gait. PERLA. EOMi. Alert. Oriented.  Psych: Normal affect, dress and demeanor. Normal speech. Normal thought content and judgment.  Assessment/Plan: Dominique Taylor is a 41 y.o. female present for OV for  Depression with anxiety/night sweats - Reports improvement with anxiety, but still suffering from anxiety. She undergoing breast CA tx and Cervical dysplasia tx. medications did not help with night sweats. - continue effexor 150 mg QD - Continue Lexapro to 20 mg daily. - Refills provided on both Effexor and Lexapro for 6 months. - Follow-up 6 months, sooner if not improving could increase effexor again to 225 mg.    Wheezing/bronchitis/tobacco use disorder - Patient with long-term smoking history. Patient was strongly encouraged to stop smoking. Discussed consequences of smoking and risk of developing COPD. - Discussed appropriate use of albuterol inhaler. Agreed to provide inhaler today with instructions on only using for shortness of breath or wheezing. If needing more than 2 times a week when not ill, then she needs to make an appointment to be seen for progressing on disease. Patient understands and is in agreement. - DuoNeb treatment in office today cleared wheezing in her left lower lobe and decreased coughing allowing her to take deeper breaths. Decided to treat with azithromycin, prednisone burst and Tessalon Perles. - ipratropium-albuterol (DUONEB) 0.5-2.5 (3) MG/3ML nebulizer  solution 3 mL - albuterol (PROVENTIL HFA;VENTOLIN HFA) 108 (90 Base) MCG/ACT inhaler; Inhale 2 puffs into the lungs every 6 (six) hours as needed for wheezing.  Dispense: 1 Inhaler; Refill: 1 - benzonatate (TESSALON) 200 MG capsule; Take 1 capsule (200 mg total) by mouth 2 (two) times daily as needed for cough.  Dispense: 30 capsule; Refill: 0 - azithromycin (ZITHROMAX) 250 MG tablet; 500 mg day 1, then 250 mg QD  Dispense: 6 tablet; Refill: 0 - predniSONE (DELTASONE) 50 MG tablet; Take 1 tablet (50 mg total) by mouth daily with breakfast.  Dispense: 5 tablet; Refill: 0 - Follow-up 2 weeks on acute illness if not resolved, would need chest x-ray    electronically signed by:  Howard Pouch, DO  [place]

## 2017-05-01 ENCOUNTER — Encounter: Payer: Self-pay | Admitting: *Deleted

## 2017-05-01 ENCOUNTER — Encounter: Payer: BLUE CROSS/BLUE SHIELD | Admitting: Obstetrics and Gynecology

## 2017-05-10 ENCOUNTER — Telehealth: Payer: Self-pay | Admitting: Obstetrics and Gynecology

## 2017-05-10 NOTE — Telephone Encounter (Signed)
Patient desires to change plans from [facility] to Vaginal hysterectomy. Chart review seems to indicate that this would be a reasonable plan. Pt is so informed.I will ask  Giovanni to change plans for surgery to vaginal hysterectomy, and then keep preop appt as scheduled early next week.

## 2017-05-22 NOTE — Patient Instructions (Signed)
Dominique Taylor  05/22/2017     @PREFPERIOPPHARMACY @   Your procedure is scheduled on  05/30/2017 .  Report to [facility] at  615   A.M.  Call this number if you have problems the morning of surgery:  [phone]   Remember:  Do not eat food or drink liquids after midnight.  Take these medicines the morning of surgery with A SIP OF WATER  Lexapro, effexor.   Do not wear jewelry, make-up or nail polish.  Do not wear lotions, powders, or perfumes, or deodorant.  Do not shave 48 hours prior to surgery.  Men may shave face and neck.  Do not bring valuables to the hospital.  [facility] is not responsible for any belongings or valuables.  Contacts, dentures or bridgework may not be worn into surgery.  Leave your suitcase in the car.  After surgery it may be brought to your room.  For patients admitted to the hospital, discharge time will be determined by your treatment team.  Patients discharged the day of surgery will not be allowed to drive home.   Name and phone number of your driver:   family Special instructions:  None  Please read over the following fact sheets that you were given. Anesthesia Post-op Instructions and Care and Recovery After Surgery       Cervical Conization Cervical conization (cone biopsy) is a procedure in which a cone-shaped portion of the cervix is cut out so that it can be examined under a microscope. The procedure is done to check for cancer cells or cells that might turn into cancer (precancerous cells). You may have this procedure if:  You have abnormal bleeding from your cervix.  You had an abnormal Pap test.  Something abnormal was seen on your cervix during an exam.  This procedure is performed in either a health care provider's office or in an operating room. Tell a health care provider about:  Any allergies you have.  All medicines you are taking, including vitamins, herbs, eye drops, creams, and over-the-counter  medicines.  Any problems you or family members have had with the use of anesthetic medicines.  Any blood disorders you have.  Any surgeries you have had.  Any medical conditions you have.  Your smoking habits.  When you normally have your period.  Whether you are pregnant or may be pregnant. What are the risks? Generally, this is a safe procedure. However, problems may occur, including:  Heavy bleeding for several days or weeks after the procedure.  Allergic reactions to medicines or dyes.  Increased risk of preterm labor in future pregnancies.  Infection (rare).  Damage to the cervix or other structures or organs (rare).  What happens before the procedure? Staying hydrated Follow instructions from your health care provider about hydration, which may include:  Up to 2 hours before the procedure - you may continue to drink clear liquids, such as water, clear fruit juice, black coffee, and plain tea.  Eating and drinking restrictions Follow instructions from your health care provider about eating and drinking, which may include:  8 hours before the procedure - stop eating heavy meals or foods such as meat, fried foods, or fatty foods.  6 hours before the procedure - stop eating light meals or foods, such as toast or cereal.  6 hours before the procedure - stop drinking milk or drinks that contain milk.  2 hours before the procedure - stop  drinking clear liquids.  General instructions  Do not douche, have sex, use tampons, or use any vaginal medicines before the procedure as told by your health care provider.  You may be asked to empty your bladder and bowel right before the procedure.  Ask your health care provider about: ? Changing or stopping your normal medicines. This is important if you take diabetes medicines or blood thinners. ? Taking medicines such as aspirin and ibuprofen. These medicines can thin your blood. Do not take these medicines before your  procedure if your doctor tells you not to.  Plan to have someone take you home from the hospital or clinic. What happens during the procedure?  To reduce your risk of infection: ? Your health care team will wash or sanitize their hands. ? Your skin will be washed with soap. ? Hair may be removed from the surgical area.  You will undress from the waist down and be given a gown to wear.  You will lie on an examining table and put your feet in stirrups.  An IV tube will be inserted into one of your veins.  You will be given one or more of the following: ? A medicine to help you relax (sedative). ? A medicine to numb the area (local anesthetic). ? A medicine to make you fall asleep (general anesthetic). ? A medicine that numbs the cervix (cervical block).  A lubricated device called a speculum will be inserted into your vagina. It will be used to spread open the walls of the vagina so your health care provider can see the inside of the vagina and cervix better.  An instrument that has a magnifying lens and a light (colposcope) will let your health care provider examine the cervix more closely.  Your health care provider will apply a solution to your cervix. This turns abnormal areas a pale color.  A tissue sample will be removed from the cervix using one of the following methods: ? The cold knife method. In this method, the tissue is cut out with a knife (scalpel). ? The loop electrosurgical excision procedure (LEEP) method. In this method, the tissue is cut out with a thin wire that can burn (cauterize) the tissue with an electrical current. ? Laser treatment method. In this method, the tissue is cut out and then cauterized with a laser beam to prevent bleeding.  Your health care provider will apply a paste over the biopsy areas to help control bleeding.  The tissue sample will be examined under a microscope. The procedure may vary among health care providers and hospitals. What  happens after the procedure?  Your blood pressure, heart rate, breathing rate, and blood oxygen level will be monitored often until the medicines you were given have worn off.  If you were given a local anesthetic, you will rest at the clinic or hospital until you are stable and feel ready to go home.  If you were given a general anesthetic, you may be monitored for a longer period of time.  You may have some cramping.  You may have bloody discharge or light to moderate bleeding.  You may have dark discharge coming from your vagina. This is from the paste used on the cervix to prevent bleeding. Summary  Cervical conization is a procedure in which a cone-shaped portion of the cervix is cut out so that it can be examined under a microscope.  The procedure is done to check for cancer cells or cells that might turn  into cancer (precancerous cells). This information is not intended to replace advice given to you by your health care provider. Make sure you discuss any questions you have with your health care provider. Document Released: 02/09/2005 Document Revised: 05/04/2016 Document Reviewed: 05/04/2016 Elsevier Interactive Patient Education  2017 [employer]  Cervical Conization, Care After This sheet gives you information about how to care for yourself after your procedure. Your doctor may also give you more specific instructions. If you have problems or questions, contact your doctor. Follow these instructions at home: Medicines  Take over-the-counter and prescription medicines only as told by your doctor.  Do not take aspirin until your doctor says it is okay.  If you take pain medicine: ? You may have constipation. To help treat this, your doctor may tell you to:  Drink enough fluid to keep your pee (urine) clear or pale yellow.  Take medicines.  Eat foods that are high in fiber. These include fresh fruits and vegetables, whole grains, bran, and beans.  Limit foods that  are high in fat and sugar. These include fried foods and sweet foods. ? Do not drive or use heavy machines. General instructions  You can eat your usual diet unless your doctor tells you not to do so.  Take showers for the first week. Do not take baths, swim, or use hot tubs until your doctor says it is okay.  Do not douche, use tampons, or have sex until your doctor says it is okay.  For 7-14 days after your procedure, avoid: ? Being very active. ? Exercising. ? Heavy lifting.  Keep all follow-up visits as told by your doctor. This is important. Contact a doctor if:  You have a rash.  You are dizzy or lightheaded.  You feel sick to your stomach (nauseous).  You throw up (vomit).  You have fluid from your vagina (vaginal discharge) that smells bad. Get help right away if:  There are blood clots coming from your vagina.  You have more bleeding than you would have in a normal period. For example, you soak a pad in less than 1 hour.  You have a fever.  You have more and more cramps.  You pass out (faint).  You have pain when peeing.  Your have a lot of pain.  Your pain gets worse.  Your pain does not get better when you take your medicine.  You have blood in your pee.  You throw up (vomit). Summary  After your procedure, take over-the-counter and prescription medicines only as told by your doctor.  Do not douche, use tampons, or have sex until your doctor says it is okay.  For about 7-14 days after your procedure, try not to exercise or lift heavy objects.  Get help right away if you have new symptoms, or if your symptoms become worse. This information is not intended to replace advice given to you by your health care provider. Make sure you discuss any questions you have with your health care provider. Document Released: 02/09/2008 Document Revised: 05/04/2016 Document Reviewed: 05/04/2016 Elsevier Interactive Patient Education  2017 [employer] Anesthesia, Adult General anesthesia is the use of medicines to make a person "go to sleep" (be unconscious) for a medical procedure. General anesthesia is often recommended when a procedure:  Is long.  Requires you to be still or in an unusual position.  Is major and can cause you to lose blood.  Is impossible to do without general anesthesia.  The  medicines used for general anesthesia are called general anesthetics. In addition to making you sleep, the medicines:  Prevent pain.  Control your blood pressure.  Relax your muscles.  Tell a health care provider about:  Any allergies you have.  All medicines you are taking, including vitamins, herbs, eye drops, creams, and over-the-counter medicines.  Any problems you or family members have had with anesthetic medicines.  Types of anesthetics you have had in the past.  Any bleeding disorders you have.  Any surgeries you have had.  Any medical conditions you have.  Any history of heart or lung conditions, such as heart failure, sleep apnea, or chronic obstructive pulmonary disease (COPD).  Whether you are pregnant or may be pregnant.  Whether you use tobacco, alcohol, marijuana, or street drugs.  Any history of Armed forces logistics/support/administrative officer.  Any history of depression or anxiety. What are the risks? Generally, this is a safe procedure. However, problems may occur, including:  Allergic reaction to anesthetics.  Lung and heart problems.  Inhaling food or liquids from your stomach into your lungs (aspiration).  Injury to nerves.  Waking up during your procedure and being unable to move (rare).  Extreme agitation or a state of mental confusion (delirium) when you wake up from the anesthetic.  Air in the bloodstream, which can lead to stroke.  These problems are more likely to develop if you are having a major surgery or if you have an advanced medical condition. You can prevent some of these complications by  answering all of your health care provider's questions thoroughly and by following all pre-procedure instructions. General anesthesia can cause side effects, including:  Nausea or vomiting  A sore throat from the breathing tube.  Feeling cold or shivery.  Feeling tired, washed out, or achy.  Sleepiness or drowsiness.  Confusion or agitation.  What happens before the procedure? Staying hydrated Follow instructions from your health care provider about hydration, which may include:  Up to 2 hours before the procedure - you may continue to drink clear liquids, such as water, clear fruit juice, black coffee, and plain tea.  Eating and drinking restrictions Follow instructions from your health care provider about eating and drinking, which may include:  8 hours before the procedure - stop eating heavy meals or foods such as meat, fried foods, or fatty foods.  6 hours before the procedure - stop eating light meals or foods, such as toast or cereal.  6 hours before the procedure - stop drinking milk or drinks that contain milk.  2 hours before the procedure - stop drinking clear liquids.  Medicines  Ask your health care provider about: ? Changing or stopping your regular medicines. This is especially important if you are taking diabetes medicines or blood thinners. ? Taking medicines such as aspirin and ibuprofen. These medicines can thin your blood. Do not take these medicines before your procedure if your health care provider instructs you not to. ? Taking new dietary supplements or medicines. Do not take these during the week before your procedure unless your health care provider approves them.  If you are told to take a medicine or to continue taking a medicine on the day of the procedure, take the medicine with sips of water. General instructions   Ask if you will be going home the same day, the following day, or after a longer hospital stay. ? Plan to have someone take you  home. ? Plan to have someone stay with you for the first  24 hours after you leave the hospital or clinic.  For 3-6 weeks before the procedure, try not to use any tobacco products, such as cigarettes, chewing tobacco, and e-cigarettes.  You may brush your teeth on the morning of the procedure, but make sure to spit out the toothpaste. What happens during the procedure?  You will be given anesthetics through a mask and through an IV tube in one of your veins.  You may receive medicine to help you relax (sedative).  As soon as you are asleep, a breathing tube may be used to help you breathe.  An anesthesia specialist will stay with you throughout the procedure. He or she will help keep you comfortable and safe by continuing to give you medicines and adjusting the amount of medicine that you get. He or she will also watch your blood pressure, pulse, and oxygen levels to make sure that the anesthetics do not cause any problems.  If a breathing tube was used to help you breathe, it will be removed before you wake up. The procedure may vary among health care providers and hospitals. What happens after the procedure?  You will wake up, often slowly, after the procedure is complete, usually in a recovery area.  Your blood pressure, heart rate, breathing rate, and blood oxygen level will be monitored until the medicines you were given have worn off.  You may be given medicine to help you calm down if you feel anxious or agitated.  If you will be going home the same day, your health care provider may check to make sure you can stand, drink, and urinate.  Your health care providers will treat your pain and side effects before you go home.  Do not drive for 24 hours if you received a sedative.  You may: ? Feel nauseous and vomit. ? Have a sore throat. ? Have mental slowness. ? Feel cold or shivery. ? Feel sleepy. ? Feel tired. ? Feel sore or achy, even in parts of your body where you did  not have surgery. This information is not intended to replace advice given to you by your health care provider. Make sure you discuss any questions you have with your health care provider. Document Released: 08/09/2007 Document Revised: 10/13/2015 Document Reviewed: 04/16/2015 Elsevier Interactive Patient Education  2018 [place] Anesthesia, Adult, Care After These instructions provide you with information about caring for yourself after your procedure. Your health care provider may also give you more specific instructions. Your treatment has been planned according to current medical practices, but problems sometimes occur. Call your health care provider if you have any problems or questions after your procedure. What can I expect after the procedure? After the procedure, it is common to have:  Vomiting.  A sore throat.  Mental slowness.  It is common to feel:  Nauseous.  Cold or shivery.  Sleepy.  Tired.  Sore or achy, even in parts of your body where you did not have surgery.  Follow these instructions at home: For at least 24 hours after the procedure:  Do not: ? Participate in activities where you could fall or become injured. ? Drive. ? Use heavy machinery. ? Drink alcohol. ? Take sleeping pills or medicines that cause drowsiness. ? Make important decisions or sign legal documents. ? Take care of children on your own.  Rest. Eating and drinking  If you vomit, drink water, juice, or soup when you can drink without vomiting.  Drink enough fluid to keep  your urine clear or pale yellow.  Make sure you have little or no nausea before eating solid foods.  Follow the diet recommended by your health care provider. General instructions  Have a responsible adult stay with you until you are awake and alert.  Return to your normal activities as told by your health care provider. Ask your health care provider what activities are safe for you.  Take  over-the-counter and prescription medicines only as told by your health care provider.  If you smoke, do not smoke without supervision.  Keep all follow-up visits as told by your health care provider. This is important. Contact a health care provider if:  You continue to have nausea or vomiting at home, and medicines are not helpful.  You cannot drink fluids or start eating again.  You cannot urinate after 8-12 hours.  You develop a skin rash.  You have fever.  You have increasing redness at the site of your procedure. Get help right away if:  You have difficulty breathing.  You have chest pain.  You have unexpected bleeding.  You feel that you are having a life-threatening or urgent problem. This information is not intended to replace advice given to you by your health care provider. Make sure you discuss any questions you have with your health care provider. Document Released: 08/08/2000 Document Revised: 10/05/2015 Document Reviewed: 04/16/2015 Elsevier Interactive Patient Education  [employer].

## 2017-05-23 ENCOUNTER — Telehealth: Payer: Self-pay | Admitting: Obstetrics and Gynecology

## 2017-05-23 NOTE — Telephone Encounter (Signed)
Patient will be scheduled for vaginal hysterectomy as per pt preference. Pap results, biopsy results, and labs reviewed.  Pt will be seen in a.m. At 8:30 for exam and confirmationm of surgery desire.

## 2017-05-24 ENCOUNTER — Ambulatory Visit (INDEPENDENT_AMBULATORY_CARE_PROVIDER_SITE_OTHER): Payer: BLUE CROSS/BLUE SHIELD | Admitting: Obstetrics and Gynecology

## 2017-05-24 ENCOUNTER — Encounter: Payer: Self-pay | Admitting: Obstetrics and Gynecology

## 2017-05-24 ENCOUNTER — Other Ambulatory Visit: Payer: Self-pay | Admitting: Obstetrics and Gynecology

## 2017-05-24 VITALS — BP 98/60 | HR 80 | Ht 63.5 in | Wt 114.0 lb

## 2017-05-24 DIAGNOSIS — D069 Carcinoma in situ of cervix, unspecified: Secondary | ICD-10-CM

## 2017-05-24 DIAGNOSIS — F17210 Nicotine dependence, cigarettes, uncomplicated: Secondary | ICD-10-CM

## 2017-05-24 DIAGNOSIS — N946 Dysmenorrhea, unspecified: Secondary | ICD-10-CM | POA: Diagnosis not present

## 2017-05-24 MED ORDER — NICOTINE 21 MG/24HR TD PT24: 21.0000 mg | MEDICATED_PATCH | Freq: Every day | TRANSDERMAL | 0 refills | Status: DC

## 2017-05-24 NOTE — Progress Notes (Addendum)
Patient ID: EBBIE SORENSON, female   DOB: 1975/07/20, 42 y.o.   MRN: [id number] Preoperative History and Physical  Dominique Taylor is a 42 y.o. G2P2 here for surgical management of HGSIL - encompassing moderate and severe dysplasia. She is currently on the third day of her menstrual cycle. No significant preoperative concerns. HPV positive. Smokes about 1 pack per day. She is interested in trying nicotine patches to quit smoking.   Per chart review: Pap on 02/15/17 Colpo Biopsy:  Moderate to severe dysplasia on biopsy, biopsies done 3 times before, snf patient has had a Leep in the past. ECC 03/17/17 rOS : Dominique Taylor finds menstrual discomforts of the first 2 days of cycle debilitating, worse since she switched from OCP to tubal ligation.  Proposed surgery: Vaginal Hysterectomy on 05/30/2017  Past Medical History:  Diagnosis Date  . Abnormal Pap smear   . Breast mass, left   . Cancer [facility])    breast cancer  . Depression   . HPV (human papilloma virus) infection    Past Surgical History:  Procedure Laterality Date  . BREAST ENHANCEMENT SURGERY    . COLPOSCOPY    . RADIOACTIVE SEED GUIDED EXCISIONAL BREAST BIOPSY Left 06/21/2016   Procedure: LEFT BREAST RADIOACTIVE SEED GUIDED EXCISIONAL BIOPSY;  Surgeon: Rolm Bookbinder, MD;  Location: [facility];  Service: General;  Laterality: Left;  . TUBAL LIGATION     OB History  Gravida Para Term Preterm AB Living  2 2       2   SAB TAB Ectopic Multiple Live Births               # Outcome Date GA Lbr Len/2nd Weight Sex Delivery Anes PTL Lv  2 Para           1 Para             Patient denies any other pertinent gynecologic issues.   Current Outpatient Medications on File Prior to Visit  Medication Sig Dispense Refill  . albuterol (PROVENTIL HFA;VENTOLIN HFA) 108 (90 Base) MCG/ACT inhaler Inhale 2 puffs into the lungs every 6 (six) hours as needed for wheezing. 1 Inhaler 1  . escitalopram (LEXAPRO) 20 MG tablet Take 1 tablet  (20 mg total) by mouth daily. 90 tablet 1  . Prenatal Vit-Fe Fumarate-FA (PRENATAL MULTIVITAMIN) TABS tablet Take 1 tablet by mouth daily at 12 noon.    . tamoxifen (NOLVADEX) 20 MG tablet TAKE 1 TABLET BY MOUTH ONCE DAILY (Patient taking differently: TAKE 20 MG BY MOUTH ONCE DAILY AT BEDTIME) 90 tablet 3  . venlafaxine XR (EFFEXOR-XR) 150 MG 24 hr capsule Take 1 capsule (150 mg total) by mouth daily with breakfast. 90 capsule 1  . metroNIDAZOLE (METROGEL) 0.75 % vaginal gel Place 1 Applicatorful vaginally at bedtime. Apply one applicatorful to vagina QOD after menses for recurrent BV (Patient not taking: Reported on 05/24/2017) 70 g 1  . predniSONE (DELTASONE) 50 MG tablet Take 1 tablet (50 mg total) by mouth daily with breakfast. (Patient not taking: Reported on 05/24/2017) 5 tablet 0   No current facility-administered medications on file prior to visit.    Allergies  Allergen Reactions  . Wellbutrin [Bupropion] Itching    Social History:   reports that she has been smoking cigarettes.  She has a 23.00 pack-year smoking history. she has never used smokeless tobacco. She reports that she does not drink alcohol or use drugs.  Family History  Problem Relation Age of Onset  . Arthritis  Mother        RA  . Cancer Mother 21       cervical cancer   . Depression Father   . Hypertension Father   . Diabetes Father   . Ulcerative colitis Father   . Hypertension Brother     Review of Systems: Noncontributory  PHYSICAL EXAM: Blood pressure 98/60, pulse 80, height 5' 3.5" (1.613 m), weight 114 lb (51.7 kg), last menstrual period 05/22/2017. General appearance - alert, well appearing, and in no distress Chest - clear to auscultation, no wheezes, rales or rhonchi, symmetric air entry Heart - normal rate and regular rhythm Abdomen - soft, nontender, nondistended, no masses or organomegaly Pelvic - examination  Good support  Vaginal side wall obstetric laceration with full healing at 9  o'clock  Uterus - posterior retroverted  Sounds to 9 cm o Extremities - peripheral pulses normal, no pedal edema, no clubbing or cyanosis  PROCEDURE Endometrial Biopsy: Patient given informed consent, signed copy in the chart, time out was performed. Time out taken. The patient was placed in the lithotomy position and the cervix brought into view with sterile speculum.  Portio of cervix cleansed x 2 with betadine swabs.  A tenaculum was placed in the anterior lip of the cervix. The uterus was sounded for depth of 9 cm,. Milex uterine Explora 3 mm was introduced to into the uterus, suction created,  and an endometrial sample was obtained. All equipment was removed and accounted for.   The patient tolerated the procedure well.   Patient given post procedure instructions.    Labs: No results found for this or any previous visit (from the past 336 hour(s)).  Imaging Studies: No results found.  Discussion of risks/benefits of hysterectomy as noted below: 1. Discussed supracervical vs total abdominal hysterectomy. Discussed benefits of forgoing cervix removal, including vaginal lubrication, and risks of forgoing cervix removal, including cervical cancer. Discussed reduced risk of ovarian cancer with oophorectomy from 1 in 100 to 1 in 300. Discussed benefits of salpingectomy with oophorectomy, including further reduced risk of cancer, and without oophorectomy, including avoiding hormone therapy. Given medical explainer review of hysterectomy.   At end of discussion, Dominique Taylor had opportunity to ask questions and has no further questions at this time.   Specific discussion of hysterectomy as noted above. Greater than 50% was spent in counseling and coordination of care with the patient.   Total time greater than: 15 minutes.     Assessment: 1. CIN ii-III, on cervical biopsy, s/p Leep. 2. Dysmenorrhea  1. Smoker 1 ppd Patient Active Problem List   Diagnosis Date Noted  . Bronchitis 04/28/2017   . CIN III (cervical intraepithelial neoplasia grade III) with severe dysplasia 04/21/2017  . Long-term current use of tamoxifen 01/09/2017  . Night sweats 01/09/2017  . Atypical lobular hyperplasia (ALH) of left breast 07/29/2016  . Depression with anxiety 02/29/2016  . Right carotid bruit 02/24/2015  . Face lesion 07/24/2014  . Tobacco use disorder 07/24/2014    Plan: 1. Patient will undergo surgical management with Vaginal Hysterectomy.  2. Will start Nicoderm patches  .mec 05/24/2017 9:29 AM  By signing my name below, I, Margit Banda, attest that this documentation has been prepared under the direction and in the presence of Jonnie Kind, MD. Electronically Signed: Margit Banda, Medical Scribe. 05/24/17. 9:29 AM.  I personally performed the services described in this documentation, which was SCRIBED in my presence. The recorded information has been reviewed and considered accurate. It has  been edited as necessary during review. Jonnie Kind, MD

## 2017-05-25 ENCOUNTER — Encounter (HOSPITAL_COMMUNITY)
Admission: RE | Admit: 2017-05-25 | Discharge: 2017-05-25 | Disposition: A | Discharge status: Home / Self Care | Payer: BLUE CROSS/BLUE SHIELD | Source: Ambulatory Visit | Attending: Obstetrics and Gynecology | Admitting: Obstetrics and Gynecology

## 2017-05-25 NOTE — Patient Instructions (Signed)
Dominique Taylor  05/25/2017     @PREFPERIOPPHARMACY @   Your procedure is scheduled on  05/30/2017   Report to [facility] at  36  A.M.  Call this number if you have problems the morning of surgery:  [phone]   Remember:  Do not eat food or drink liquids after midnight.  Take these medicines the morning of surgery with A SIP OF WATER  Lexapro, effexor. Use your inhaler before you come and bring a rescue inhaler with you if you have one.   Do not wear jewelry, make-up or nail polish.  Do not wear lotions, powders, or perfumes, or deodorant.  Do not shave 48 hours prior to surgery.  Men may shave face and neck.  Do not bring valuables to the hospital.  [facility] is not responsible for any belongings or valuables.  Contacts, dentures or bridgework may not be worn into surgery.  Leave your suitcase in the car.  After surgery it may be brought to your room.  For patients admitted to the hospital, discharge time will be determined by your treatment team.  Patients discharged the day of surgery will not be allowed to drive home.   Name and phone number of your driver:   family Special instructions:  Follow the diet and prep instructions given to you by Dr Glo Herring (enclosed).  Please read over the following fact sheets that you were given. Pain Booklet, Coughing and Deep Breathing, Blood Transfusion Information, MRSA Information, Surgical Site Infection Prevention, Anesthesia Post-op Instructions and Care and Recovery After Surgery    Take 1 bottle of magnesium Citrate at 12 noon the day before your surgery. Once you drink this, drink plenty of liquids after it in order to help the laxative work. You cannot have any solid foods after you drink this. You may have any type of liquids except for milk or milk products. You may have jello, broth and popsicles as well.       Vaginal Hysterectomy A vaginal hysterectomy is a procedure to remove all or part of the  uterus through a small incision in the vagina. In this procedure, your health care provider may remove your entire uterus, including the lower end (cervix). You may need a vaginal hysterectomy to treat:  Uterine fibroids.  A condition that causes the lining of the uterus to grow in other areas (endometriosis).  Problems with pelvic support.  Cancer of the cervix, ovaries, uterus, or tissue that lines the uterus (endometrium).  Excessive (dysfunctional) uterine bleeding.  When removing your uterus, your health care provider may also remove the organs that produce eggs (ovaries) and the tubes that carry eggs to your uterus (fallopian tubes). After a vaginal hysterectomy, you will no longer be able to have a baby. You will also no longer get your menstrual period. Tell a health care provider about:  Any allergies you have.  All medicines you are taking, including vitamins, herbs, eye drops, creams, and over-the-counter medicines.  Any problems you or family members have had with anesthetic medicines.  Any blood disorders you have.  Any surgeries you have had.  Any medical conditions you have.  Whether you are pregnant or may be pregnant. What are the risks? Generally, this is a safe procedure. However, problems may occur, including:  Bleeding.  Infection.  A blood clot that forms in your leg and travels to your lungs (pulmonary embolism).  Damage to surrounding organs.  Pain  during sex.  What happens before the procedure?  Ask your health care provider what organs will be removed during surgery.  Ask your health care provider about: ? Changing or stopping your regular medicines. This is especially important if you are taking diabetes medicines or blood thinners. ? Taking medicines such as aspirin and ibuprofen. These medicines can thin your blood. Do not take these medicines before your procedure if your health care provider instructs you not to.  Follow instructions  from your health care provider about eating or drinking restrictions.  Do not use any tobacco products, such as cigarettes, chewing tobacco, and e-cigarettes. If you need help quitting, ask your health care provider.  Plan to have someone take you home after discharge from the hospital. What happens during the procedure?  To reduce your risk of infection: ? Your health care team will wash or sanitize their hands. ? Your skin will be washed with soap.  An IV tube will be inserted into one of your veins.  You may be given antibiotic medicine to help prevent infection.  You will be given one or more of the following: ? A medicine to help you relax (sedative). ? A medicine to numb the area (local anesthetic). ? A medicine to make you fall asleep (general anesthetic). ? A medicine that is injected into an area of your body to numb everything beyond the injection site (regional anesthetic).  Your surgeon will make an incision in your vagina.  Your surgeon will locate and remove all or part of your uterus.  Your ovaries and fallopian tubes may be removed at the same time.  The incision will be closed with stitches (sutures) that dissolve over time. The procedure may vary among health care providers and hospitals. What happens after the procedure?  Your blood pressure, heart rate, breathing rate, and blood oxygen level will be monitored often until the medicines you were given have worn off.  You will be encouraged to get up and walk around after a few hours to help prevent complications.  You may have IV tubes in place for a few days.  You will be given pain medicine as needed.  Do not drive for 24 hours if you were given a sedative. This information is not intended to replace advice given to you by your health care provider. Make sure you discuss any questions you have with your health care provider. Document Released: 08/24/2015 Document Revised: 10/08/2015 Document Reviewed:  05/17/2015 Elsevier Interactive Patient Education  2018 [place].  Vaginal Hysterectomy, Care After Refer to this sheet in the next few weeks. These instructions provide you with information about caring for yourself after your procedure. Your health care provider may also give you more specific instructions. Your treatment has been planned according to current medical practices, but problems sometimes occur. Call your health care provider if you have any problems or questions after your procedure. What can I expect after the procedure? After the procedure, it is common to have:  Pain.  Soreness and numbness in your incision areas.  Vaginal bleeding and discharge.  Constipation.  Temporary problems emptying the bladder.  Feelings of sadness or other emotions.  Follow these instructions at home: Medicines  Take over-the-counter and prescription medicines only as told by your health care provider.  If you were prescribed an antibiotic medicine, take it as told by your health care provider. Do not stop taking the antibiotic even if you start to feel better.  Do not drive or  operate heavy machinery while taking prescription pain medicine. Activity  Return to your normal activities as told by your health care provider. Ask your health care provider what activities are safe for you.  Get regular exercise as told by your health care provider. You may be told to take short walks every day and go farther each time.  Do not lift anything that is heavier than 10 lb (4.5 kg). General instructions   Do not put anything in your vagina for 6 weeks after your surgery or as told by your health care provider. This includes tampons and douches.  Do not have sex until your health care provider says you can.  Do not take baths, swim, or use a hot tub until your health care provider approves.  Drink enough fluid to keep your urine clear or pale yellow.  Do not drive for 24 hours if you  were given a sedative.  Keep all follow-up visits as told by your health care provider. This is important. Contact a health care provider if:  Your pain medicine is not helping.  You have a fever.  You have redness, swelling, or pain at your incision site.  You have blood, pus, or a bad-smelling discharge from your vagina.  You continue to have difficulty urinating. Get help right away if:  You have severe abdominal or back pain.  You have heavy bleeding from your vagina.  You have chest pain or shortness of breath. This information is not intended to replace advice given to you by your health care provider. Make sure you discuss any questions you have with your health care provider. Document Released: 08/24/2015 Document Revised: 10/08/2015 Document Reviewed: 05/17/2015 Elsevier Interactive Patient Education  2018 [place] Anesthesia, Adult General anesthesia is the use of medicines to make a person "go to sleep" (be unconscious) for a medical procedure. General anesthesia is often recommended when a procedure:  Is long.  Requires you to be still or in an unusual position.  Is major and can cause you to lose blood.  Is impossible to do without general anesthesia.  The medicines used for general anesthesia are called general anesthetics. In addition to making you sleep, the medicines:  Prevent pain.  Control your blood pressure.  Relax your muscles.  Tell a health care provider about:  Any allergies you have.  All medicines you are taking, including vitamins, herbs, eye drops, creams, and over-the-counter medicines.  Any problems you or family members have had with anesthetic medicines.  Types of anesthetics you have had in the past.  Any bleeding disorders you have.  Any surgeries you have had.  Any medical conditions you have.  Any history of heart or lung conditions, such as heart failure, sleep apnea, or chronic obstructive pulmonary  disease (COPD).  Whether you are pregnant or may be pregnant.  Whether you use tobacco, alcohol, marijuana, or street drugs.  Any history of Armed forces logistics/support/administrative officer.  Any history of depression or anxiety. What are the risks? Generally, this is a safe procedure. However, problems may occur, including:  Allergic reaction to anesthetics.  Lung and heart problems.  Inhaling food or liquids from your stomach into your lungs (aspiration).  Injury to nerves.  Waking up during your procedure and being unable to move (rare).  Extreme agitation or a state of mental confusion (delirium) when you wake up from the anesthetic.  Air in the bloodstream, which can lead to stroke.  These problems are more likely to develop  if you are having a major surgery or if you have an advanced medical condition. You can prevent some of these complications by answering all of your health care provider's questions thoroughly and by following all pre-procedure instructions. General anesthesia can cause side effects, including:  Nausea or vomiting  A sore throat from the breathing tube.  Feeling cold or shivery.  Feeling tired, washed out, or achy.  Sleepiness or drowsiness.  Confusion or agitation.  What happens before the procedure? Staying hydrated Follow instructions from your health care provider about hydration, which may include:  Up to 2 hours before the procedure - you may continue to drink clear liquids, such as water, clear fruit juice, black coffee, and plain tea.  Eating and drinking restrictions Follow instructions from your health care provider about eating and drinking, which may include:  8 hours before the procedure - stop eating heavy meals or foods such as meat, fried foods, or fatty foods.  6 hours before the procedure - stop eating light meals or foods, such as toast or cereal.  6 hours before the procedure - stop drinking milk or drinks that contain milk.  2 hours before the  procedure - stop drinking clear liquids.  Medicines  Ask your health care provider about: ? Changing or stopping your regular medicines. This is especially important if you are taking diabetes medicines or blood thinners. ? Taking medicines such as aspirin and ibuprofen. These medicines can thin your blood. Do not take these medicines before your procedure if your health care provider instructs you not to. ? Taking new dietary supplements or medicines. Do not take these during the week before your procedure unless your health care provider approves them.  If you are told to take a medicine or to continue taking a medicine on the day of the procedure, take the medicine with sips of water. General instructions   Ask if you will be going home the same day, the following day, or after a longer hospital stay. ? Plan to have someone take you home. ? Plan to have someone stay with you for the first 24 hours after you leave the hospital or clinic.  For 3-6 weeks before the procedure, try not to use any tobacco products, such as cigarettes, chewing tobacco, and e-cigarettes.  You may brush your teeth on the morning of the procedure, but make sure to spit out the toothpaste. What happens during the procedure?  You will be given anesthetics through a mask and through an IV tube in one of your veins.  You may receive medicine to help you relax (sedative).  As soon as you are asleep, a breathing tube may be used to help you breathe.  An anesthesia specialist will stay with you throughout the procedure. He or she will help keep you comfortable and safe by continuing to give you medicines and adjusting the amount of medicine that you get. He or she will also watch your blood pressure, pulse, and oxygen levels to make sure that the anesthetics do not cause any problems.  If a breathing tube was used to help you breathe, it will be removed before you wake up. The procedure may vary among health care  providers and hospitals. What happens after the procedure?  You will wake up, often slowly, after the procedure is complete, usually in a recovery area.  Your blood pressure, heart rate, breathing rate, and blood oxygen level will be monitored until the medicines you were given have worn off.  You may be given medicine to help you calm down if you feel anxious or agitated.  If you will be going home the same day, your health care provider may check to make sure you can stand, drink, and urinate.  Your health care providers will treat your pain and side effects before you go home.  Do not drive for 24 hours if you received a sedative.  You may: ? Feel nauseous and vomit. ? Have a sore throat. ? Have mental slowness. ? Feel cold or shivery. ? Feel sleepy. ? Feel tired. ? Feel sore or achy, even in parts of your body where you did not have surgery. This information is not intended to replace advice given to you by your health care provider. Make sure you discuss any questions you have with your health care provider. Document Released: 08/09/2007 Document Revised: 10/13/2015 Document Reviewed: 04/16/2015 Elsevier Interactive Patient Education  2018 [place] Anesthesia, Adult, Care After These instructions provide you with information about caring for yourself after your procedure. Your health care provider may also give you more specific instructions. Your treatment has been planned according to current medical practices, but problems sometimes occur. Call your health care provider if you have any problems or questions after your procedure. What can I expect after the procedure? After the procedure, it is common to have:  Vomiting.  A sore throat.  Mental slowness.  It is common to feel:  Nauseous.  Cold or shivery.  Sleepy.  Tired.  Sore or achy, even in parts of your body where you did not have surgery.  Follow these instructions at home: For at least 24  hours after the procedure:  Do not: ? Participate in activities where you could fall or become injured. ? Drive. ? Use heavy machinery. ? Drink alcohol. ? Take sleeping pills or medicines that cause drowsiness. ? Make important decisions or sign legal documents. ? Take care of children on your own.  Rest. Eating and drinking  If you vomit, drink water, juice, or soup when you can drink without vomiting.  Drink enough fluid to keep your urine clear or pale yellow.  Make sure you have little or no nausea before eating solid foods.  Follow the diet recommended by your health care provider. General instructions  Have a responsible adult stay with you until you are awake and alert.  Return to your normal activities as told by your health care provider. Ask your health care provider what activities are safe for you.  Take over-the-counter and prescription medicines only as told by your health care provider.  If you smoke, do not smoke without supervision.  Keep all follow-up visits as told by your health care provider. This is important. Contact a health care provider if:  You continue to have nausea or vomiting at home, and medicines are not helpful.  You cannot drink fluids or start eating again.  You cannot urinate after 8-12 hours.  You develop a skin rash.  You have fever.  You have increasing redness at the site of your procedure. Get help right away if:  You have difficulty breathing.  You have chest pain.  You have unexpected bleeding.  You feel that you are having a life-threatening or urgent problem. This information is not intended to replace advice given to you by your health care provider. Make sure you discuss any questions you have with your health care provider. Document Released: 08/08/2000 Document Revised: 10/05/2015 Document Reviewed: 04/16/2015 Elsevier Interactive  Patient Education  [employer].

## 2017-05-29 ENCOUNTER — Encounter (HOSPITAL_COMMUNITY): Payer: Self-pay

## 2017-05-29 ENCOUNTER — Encounter (HOSPITAL_COMMUNITY)
Admission: RE | Admit: 2017-05-29 | Discharge: 2017-05-29 | Disposition: A | Discharge status: Home / Self Care | Payer: BLUE CROSS/BLUE SHIELD | Source: Ambulatory Visit | Attending: Obstetrics and Gynecology | Admitting: Obstetrics and Gynecology

## 2017-05-29 ENCOUNTER — Other Ambulatory Visit: Payer: Self-pay

## 2017-05-29 DIAGNOSIS — D069 Carcinoma in situ of cervix, unspecified: Secondary | ICD-10-CM | POA: Diagnosis present

## 2017-05-29 DIAGNOSIS — Z79899 Other long term (current) drug therapy: Secondary | ICD-10-CM | POA: Diagnosis not present

## 2017-05-29 DIAGNOSIS — D252 Subserosal leiomyoma of uterus: Secondary | ICD-10-CM | POA: Diagnosis not present

## 2017-05-29 DIAGNOSIS — F418 Other specified anxiety disorders: Secondary | ICD-10-CM | POA: Diagnosis not present

## 2017-05-29 DIAGNOSIS — N800 Endometriosis of uterus: Secondary | ICD-10-CM | POA: Diagnosis not present

## 2017-05-29 DIAGNOSIS — N7011 Chronic salpingitis: Secondary | ICD-10-CM | POA: Diagnosis not present

## 2017-05-29 DIAGNOSIS — N946 Dysmenorrhea, unspecified: Secondary | ICD-10-CM | POA: Diagnosis not present

## 2017-05-29 DIAGNOSIS — F17210 Nicotine dependence, cigarettes, uncomplicated: Secondary | ICD-10-CM | POA: Diagnosis not present

## 2017-05-29 LAB — COMPREHENSIVE METABOLIC PANEL
ALBUMIN: 3.5 g/dL (ref 3.5–5.0)
ALK PHOS: 53 U/L (ref 38–126)
ALT: 16 U/L (ref 14–54)
AST: 21 U/L (ref 15–41)
Anion gap: 12 (ref 5–15)
BUN: 17 mg/dL (ref 6–20)
CALCIUM: 9.3 mg/dL (ref 8.9–10.3)
CO2: 24 mmol/L (ref 22–32)
CREATININE: 0.51 mg/dL (ref 0.44–1.00)
Chloride: 101 mmol/L (ref 101–111)
GFR calc Af Amer: >60 mL/min (ref >60)
GLUCOSE: 90 mg/dL (ref 65–99)
Potassium: 3.9 mmol/L (ref 3.5–5.1)
Sodium: 137 mmol/L (ref 135–145)
Total Bilirubin: 0.3 mg/dL (ref 0.3–1.2)
Total Protein: 6.7 g/dL (ref 6.5–8.1)

## 2017-05-29 LAB — URINALYSIS, ROUTINE W REFLEX MICROSCOPIC
BILIRUBIN URINE: NEGATIVE
Bacteria, UA: NONE SEEN
Glucose, UA: NEGATIVE mg/dL
KETONES UR: NEGATIVE mg/dL
Leukocytes, UA: NEGATIVE
NITRITE: NEGATIVE
PH: 5 (ref 5.0–8.0)
PROTEIN: NEGATIVE mg/dL
Specific Gravity, Urine: 1.014 (ref 1.005–1.030)

## 2017-05-29 LAB — TYPE AND SCREEN
ABO/RH(D): A POS
Antibody Screen: NEGATIVE

## 2017-05-29 LAB — CBC
HEMATOCRIT: 41.8 % (ref 36.0–46.0)
HEMOGLOBIN: 13.7 g/dL (ref 12.0–15.0)
MCH: 34.4 pg — ABNORMAL HIGH (ref 26.0–34.0)
MCHC: 32.8 g/dL (ref 30.0–36.0)
MCV: 105 fL — AB (ref 78.0–100.0)
Platelets: 255 10*3/uL (ref 150–400)
RBC: 3.98 MIL/uL (ref 3.87–5.11)
RDW: 13 % (ref 11.5–15.5)
WBC: 9.2 10*3/uL (ref 4.0–10.5)

## 2017-05-29 LAB — HCG, SERUM, QUALITATIVE: PREG SERUM: NEGATIVE

## 2017-05-30 ENCOUNTER — Ambulatory Visit (HOSPITAL_COMMUNITY): Payer: BLUE CROSS/BLUE SHIELD | Admitting: Anesthesiology

## 2017-05-30 ENCOUNTER — Encounter (HOSPITAL_COMMUNITY): Payer: Self-pay | Admitting: *Deleted

## 2017-05-30 ENCOUNTER — Encounter (HOSPITAL_COMMUNITY)
Admission: RE | Disposition: A | Discharge status: Home / Self Care | Payer: Self-pay | Source: Ambulatory Visit | Attending: Obstetrics and Gynecology

## 2017-05-30 ENCOUNTER — Observation Stay (HOSPITAL_COMMUNITY)
Admission: RE | Admit: 2017-05-30 | Discharge: 2017-05-31 | Disposition: A | Discharge status: Home / Self Care | Payer: BLUE CROSS/BLUE SHIELD | Source: Ambulatory Visit | Attending: Obstetrics and Gynecology | Admitting: Obstetrics and Gynecology

## 2017-05-30 DIAGNOSIS — D252 Subserosal leiomyoma of uterus: Secondary | ICD-10-CM | POA: Insufficient documentation

## 2017-05-30 DIAGNOSIS — N7011 Chronic salpingitis: Secondary | ICD-10-CM | POA: Insufficient documentation

## 2017-05-30 DIAGNOSIS — N946 Dysmenorrhea, unspecified: Secondary | ICD-10-CM | POA: Insufficient documentation

## 2017-05-30 DIAGNOSIS — D069 Carcinoma in situ of cervix, unspecified: Principal | ICD-10-CM | POA: Insufficient documentation

## 2017-05-30 DIAGNOSIS — F418 Other specified anxiety disorders: Secondary | ICD-10-CM | POA: Insufficient documentation

## 2017-05-30 DIAGNOSIS — Z90710 Acquired absence of both cervix and uterus: Secondary | ICD-10-CM | POA: Diagnosis present

## 2017-05-30 DIAGNOSIS — Z79899 Other long term (current) drug therapy: Secondary | ICD-10-CM | POA: Insufficient documentation

## 2017-05-30 DIAGNOSIS — N800 Endometriosis of uterus: Secondary | ICD-10-CM | POA: Insufficient documentation

## 2017-05-30 DIAGNOSIS — F17210 Nicotine dependence, cigarettes, uncomplicated: Secondary | ICD-10-CM | POA: Insufficient documentation

## 2017-05-30 HISTORY — PX: VAGINAL HYSTERECTOMY: SHX2639

## 2017-05-30 LAB — GLUCOSE, CAPILLARY
Glucose-Capillary: 91 mg/dL (ref 65–99)
Glucose-Capillary: 123 mg/dL — ABNORMAL HIGH (ref 65–99)

## 2017-05-30 SURGERY — HYSTERECTOMY, VAGINAL
Anesthesia: General

## 2017-05-30 MED ORDER — SODIUM CHLORIDE 0.9 % IV SOLN
INTRAVENOUS | Status: DC
  Administered 2017-05-30 – 2017-05-31 (×3): via INTRAVENOUS

## 2017-05-30 MED ORDER — HYDROMORPHONE 1 MG/ML IV SOLN
INTRAVENOUS | Status: AC
  Filled 2017-05-30: qty 25

## 2017-05-30 MED ORDER — ALBUTEROL SULFATE HFA 108 (90 BASE) MCG/ACT IN AERS: 2.0000 | INHALATION_SPRAY | Freq: Four times a day (QID) | RESPIRATORY_TRACT | Status: DC | PRN

## 2017-05-30 MED ORDER — FENTANYL CITRATE (PF) 100 MCG/2ML IJ SOLN
INTRAMUSCULAR | Status: DC | PRN
  Administered 2017-05-30 (×4): 50 ug via INTRAVENOUS

## 2017-05-30 MED ORDER — ONDANSETRON HCL 4 MG PO TABS: 4.0000 mg | ORAL_TABLET | Freq: Four times a day (QID) | ORAL | Status: DC | PRN

## 2017-05-30 MED ORDER — FENTANYL CITRATE (PF) 250 MCG/5ML IJ SOLN
INTRAMUSCULAR | Status: AC
  Filled 2017-05-30: qty 5

## 2017-05-30 MED ORDER — SUGAMMADEX SODIUM 200 MG/2ML IV SOLN
INTRAVENOUS | Status: DC | PRN
  Administered 2017-05-30: 150 mg via INTRAVENOUS

## 2017-05-30 MED ORDER — DIPHENHYDRAMINE HCL 12.5 MG/5ML PO ELIX: 12.5000 mg | ORAL_SOLUTION | Freq: Four times a day (QID) | ORAL | Status: DC | PRN

## 2017-05-30 MED ORDER — LIDOCAINE HCL (PF) 1 % IJ SOLN
INTRAMUSCULAR | Status: AC
  Filled 2017-05-30: qty 5

## 2017-05-30 MED ORDER — BUPIVACAINE-EPINEPHRINE (PF) 0.5% -1:200000 IJ SOLN
INTRAMUSCULAR | Status: AC
Start: 2017-05-30 — End: ?
  Filled 2017-05-30: qty 30

## 2017-05-30 MED ORDER — LIDOCAINE HCL 1 % IJ SOLN
INTRAMUSCULAR | Status: DC | PRN
  Administered 2017-05-30: 25 mg via INTRADERMAL

## 2017-05-30 MED ORDER — TAMOXIFEN CITRATE 10 MG PO TABS
20.0000 mg | ORAL_TABLET | Freq: Every day | ORAL | Status: DC
  Administered 2017-05-30 – 2017-05-31 (×2): 20 mg via ORAL
  Filled 2017-05-30: qty 1
  Filled 2017-05-30 (×2): qty 2
  Filled 2017-05-30: qty 1

## 2017-05-30 MED ORDER — MIDAZOLAM HCL 2 MG/2ML IJ SOLN
1.0000 mg | INTRAMUSCULAR | Status: DC
  Administered 2017-05-30: 2 mg via INTRAVENOUS

## 2017-05-30 MED ORDER — ROCURONIUM BROMIDE 100 MG/10ML IV SOLN
INTRAVENOUS | Status: DC | PRN
  Administered 2017-05-30: 30 mg via INTRAVENOUS
  Administered 2017-05-30: 15 mg via INTRAVENOUS
  Administered 2017-05-30: 5 mg via INTRAVENOUS

## 2017-05-30 MED ORDER — 0.9 % SODIUM CHLORIDE (POUR BTL) OPTIME
TOPICAL | Status: DC | PRN
  Administered 2017-05-30: 400 mL

## 2017-05-30 MED ORDER — KETOROLAC TROMETHAMINE 30 MG/ML IJ SOLN: 30.0000 mg | Freq: Four times a day (QID) | INTRAMUSCULAR | Status: DC

## 2017-05-30 MED ORDER — ROCURONIUM BROMIDE 50 MG/5ML IV SOLN
INTRAVENOUS | Status: AC
  Filled 2017-05-30: qty 1

## 2017-05-30 MED ORDER — HYDROMORPHONE 1 MG/ML IV SOLN
INTRAVENOUS | Status: DC
  Administered 2017-05-30: 11:00:00 via INTRAVENOUS
  Administered 2017-05-30: 4.2 mg via INTRAVENOUS
  Administered 2017-05-31: 1.2 mg via INTRAVENOUS

## 2017-05-30 MED ORDER — CEFAZOLIN SODIUM-DEXTROSE 2-4 GM/100ML-% IV SOLN
2.0000 g | INTRAVENOUS | Status: AC
  Administered 2017-05-30: 2 g via INTRAVENOUS
  Filled 2017-05-30: qty 100

## 2017-05-30 MED ORDER — PROPOFOL 10 MG/ML IV BOLUS
INTRAVENOUS | Status: AC
  Filled 2017-05-30: qty 20

## 2017-05-30 MED ORDER — NALOXONE HCL 0.4 MG/ML IJ SOLN: 0.4000 mg | INTRAMUSCULAR | Status: DC | PRN

## 2017-05-30 MED ORDER — KETOROLAC TROMETHAMINE 30 MG/ML IJ SOLN
INTRAMUSCULAR | Status: AC
  Filled 2017-05-30: qty 1

## 2017-05-30 MED ORDER — LACTATED RINGERS IV SOLN
INTRAVENOUS | Status: DC
  Administered 2017-05-30: 09:00:00 via INTRAVENOUS
  Administered 2017-05-30: 1000 mL via INTRAVENOUS

## 2017-05-30 MED ORDER — MIDAZOLAM HCL 2 MG/2ML IJ SOLN
INTRAMUSCULAR | Status: AC
  Filled 2017-05-30: qty 2

## 2017-05-30 MED ORDER — ONDANSETRON HCL 4 MG/2ML IJ SOLN
INTRAMUSCULAR | Status: AC
  Filled 2017-05-30: qty 2

## 2017-05-30 MED ORDER — SUCCINYLCHOLINE CHLORIDE 20 MG/ML IJ SOLN
INTRAMUSCULAR | Status: AC
  Filled 2017-05-30: qty 1

## 2017-05-30 MED ORDER — ONDANSETRON HCL 4 MG/2ML IJ SOLN
4.0000 mg | Freq: Once | INTRAMUSCULAR | Status: AC
  Administered 2017-05-30: 4 mg via INTRAVENOUS

## 2017-05-30 MED ORDER — PANTOPRAZOLE SODIUM 40 MG PO TBEC
40.0000 mg | DELAYED_RELEASE_TABLET | Freq: Every day | ORAL | Status: DC
  Administered 2017-05-31: 40 mg via ORAL
  Filled 2017-05-30: qty 1

## 2017-05-30 MED ORDER — ALBUTEROL SULFATE (2.5 MG/3ML) 0.083% IN NEBU: 2.5000 mg | INHALATION_SOLUTION | Freq: Four times a day (QID) | RESPIRATORY_TRACT | Status: DC | PRN

## 2017-05-30 MED ORDER — IBUPROFEN 600 MG PO TABS: 600.0000 mg | ORAL_TABLET | Freq: Four times a day (QID) | ORAL | Status: DC | PRN

## 2017-05-30 MED ORDER — PROPOFOL 10 MG/ML IV BOLUS
INTRAVENOUS | Status: DC | PRN
  Administered 2017-05-30: 130 mg via INTRAVENOUS

## 2017-05-30 MED ORDER — KETOROLAC TROMETHAMINE 30 MG/ML IJ SOLN
30.0000 mg | Freq: Once | INTRAMUSCULAR | Status: AC
  Administered 2017-05-30: 30 mg via INTRAVENOUS

## 2017-05-30 MED ORDER — KETOROLAC TROMETHAMINE 30 MG/ML IJ SOLN
30.0000 mg | Freq: Four times a day (QID) | INTRAMUSCULAR | Status: DC
  Administered 2017-05-30 – 2017-05-31 (×5): 30 mg via INTRAVENOUS
  Filled 2017-05-30 (×5): qty 1

## 2017-05-30 MED ORDER — MIDAZOLAM HCL 5 MG/5ML IJ SOLN
INTRAMUSCULAR | Status: DC | PRN
  Administered 2017-05-30 (×2): 1 mg via INTRAVENOUS

## 2017-05-30 MED ORDER — DIPHENHYDRAMINE HCL 50 MG/ML IJ SOLN: 12.5000 mg | Freq: Four times a day (QID) | INTRAMUSCULAR | Status: DC | PRN

## 2017-05-30 MED ORDER — ONDANSETRON HCL 4 MG/2ML IJ SOLN: 4.0000 mg | Freq: Four times a day (QID) | INTRAMUSCULAR | Status: DC | PRN

## 2017-05-30 MED ORDER — NICOTINE 21 MG/24HR TD PT24
21.0000 mg | MEDICATED_PATCH | Freq: Every day | TRANSDERMAL | Status: DC
  Administered 2017-05-30 – 2017-05-31 (×2): 21 mg via TRANSDERMAL
  Filled 2017-05-30 (×2): qty 1

## 2017-05-30 MED ORDER — HYDROMORPHONE HCL 1 MG/ML IJ SOLN: 0.2500 mg | INTRAMUSCULAR | Status: DC | PRN

## 2017-05-30 MED ORDER — ESCITALOPRAM OXALATE 10 MG PO TABS
20.0000 mg | ORAL_TABLET | Freq: Every day | ORAL | Status: DC
  Administered 2017-05-30 – 2017-05-31 (×2): 20 mg via ORAL
  Filled 2017-05-30 (×2): qty 2

## 2017-05-30 MED ORDER — SODIUM CHLORIDE 0.9% FLUSH: 9.0000 mL | INTRAVENOUS | Status: DC | PRN

## 2017-05-30 MED ORDER — VENLAFAXINE HCL ER 75 MG PO CP24
150.0000 mg | ORAL_CAPSULE | Freq: Every day | ORAL | Status: DC
  Administered 2017-05-31: 150 mg via ORAL
  Filled 2017-05-30: qty 1
  Filled 2017-05-30: qty 2

## 2017-05-30 MED ORDER — BUPIVACAINE-EPINEPHRINE 0.5% -1:200000 IJ SOLN
INTRAMUSCULAR | Status: DC | PRN
  Administered 2017-05-30: 14 mL

## 2017-05-30 SURGICAL SUPPLY — 34 items
BAG HAMPER (MISCELLANEOUS) ×2 IMPLANT
CLOTH BEACON ORANGE TIMEOUT ST (SAFETY) ×2 IMPLANT
COVER LIGHT HANDLE STERIS (MISCELLANEOUS) ×4 IMPLANT
DECANTER SPIKE VIAL GLASS SM (MISCELLANEOUS) ×2 IMPLANT
DRAPE HALF SHEET 40X57 (DRAPES) ×2 IMPLANT
DRAPE STERI URO 9X17 APER PCH (DRAPES) ×2 IMPLANT
ELECT REM PT RETURN 9FT ADLT (ELECTROSURGICAL) ×2
ELECTRODE REM PT RTRN 9FT ADLT (ELECTROSURGICAL) ×1 IMPLANT
FORMALIN 10 PREFIL 480ML (MISCELLANEOUS) ×2 IMPLANT
GAUZE PACKING 2X5 YD STRL (GAUZE/BANDAGES/DRESSINGS) ×2 IMPLANT
GAUZE SPONGE 4X4 12PLY STRL (GAUZE/BANDAGES/DRESSINGS) ×2 IMPLANT
GLOVE BIOGEL PI IND STRL 7.0 (GLOVE) ×1 IMPLANT
GLOVE BIOGEL PI IND STRL 9 (GLOVE) ×1 IMPLANT
GLOVE BIOGEL PI INDICATOR 7.0 (GLOVE) ×1
GLOVE BIOGEL PI INDICATOR 9 (GLOVE) ×1
GLOVE ECLIPSE 9.0 STRL (GLOVE) ×2 IMPLANT
GOWN SPEC L3 XXLG W/TWL (GOWN DISPOSABLE) ×2 IMPLANT
GOWN STRL REUS W/TWL LRG LVL3 (GOWN DISPOSABLE) ×4 IMPLANT
IV NS IRRIG 3000ML ARTHROMATIC (IV SOLUTION) ×2 IMPLANT
KIT ROOM TURNOVER AP CYSTO (KITS) ×2 IMPLANT
MANIFOLD NEPTUNE II (INSTRUMENTS) ×2 IMPLANT
NS IRRIG 1000ML POUR BTL (IV SOLUTION) ×2 IMPLANT
PACK PERI GYN (CUSTOM PROCEDURE TRAY) ×2 IMPLANT
PAD ARMBOARD 7.5X6 YLW CONV (MISCELLANEOUS) ×2 IMPLANT
SET BASIN LINEN APH (SET/KITS/TRAYS/PACK) ×2 IMPLANT
SUT CHROMIC 0 CT 1 (SUTURE) ×21 IMPLANT
SUT CHROMIC 2 0 CT 1 (SUTURE) ×3 IMPLANT
SUT CHROMIC GUT BROWN 0 54 (SUTURE) IMPLANT
SUT CHROMIC GUT BROWN 0 54IN (SUTURE)
SUT PROLENE 2 0 SH 30 (SUTURE) IMPLANT
SUT VIC AB 0 CT2 8-18 (SUTURE) IMPLANT
TRAY FOLEY W/METER SILVER 16FR (SET/KITS/TRAYS/PACK) ×2 IMPLANT
VERSALIGHT (MISCELLANEOUS) ×2 IMPLANT
WATER STERILE IRR 1000ML POUR (IV SOLUTION) ×2 IMPLANT

## 2017-05-30 NOTE — Anesthesia Postprocedure Evaluation (Signed)
Anesthesia Post Note  Patient: Dominique Taylor  Procedure(s) Performed: HYSTERECTOMY VAGINAL WITH BILATERAL PARTIAL SALPINGECTOMY (N/A )  Patient location during evaluation: PACU Anesthesia Type: General Level of consciousness: awake and patient cooperative Pain management: pain level controlled Vital Signs Assessment: post-procedure vital signs reviewed and stable Respiratory status: spontaneous breathing, nonlabored ventilation and respiratory function stable Cardiovascular status: blood pressure returned to baseline Postop Assessment: no apparent nausea or vomiting Anesthetic complications: no     Last Vitals:  Vitals:   05/30/17 0720 05/30/17 0725  BP: 111/73 104/73  Pulse:    Resp: (!) 23 19  Temp:    SpO2: 93% 93%    Last Pain:  Vitals:   05/30/17 0635  TempSrc: Oral  PainSc: 0-No pain                 Tyshana Nishida J

## 2017-05-30 NOTE — Brief Op Note (Signed)
05/30/2017  9:29 AM  PATIENT:  Dominique Taylor  42 y.o. female  PRE-OPERATIVE DIAGNOSIS: CIN II-III ,dysmenorrhea  POST-OPERATIVE DIAGNOSIS: CIN-II-III ,dysmenorrhea and proximal bilateral salpinx  PROCEDURE:  Procedure(s): HYSTERECTOMY VAGINAL WITH BILATERAL PARTIAL SALPINGECTOMY (N/A)  SURGEON:  Surgeon(s) and Role:    Jonnie Kind, MD - Primary  PHYSICIAN ASSISTANT:   ASSISTANTS: Corrie Dandy RNFA  ANESTHESIA:   local and general  EBL:  110 mL   BLOOD ADMINISTERED:none  DRAINS: none   LOCAL MEDICATIONS USED:  MARCAINE    and Amount: 14 ml  SPECIMEN:  Source of Specimen:  Uterus, cervix, proximal fallopian tubes bilaterally  DISPOSITION OF SPECIMEN:  PATHOLOGY  COUNTS:  YES  TOURNIQUET:  * No tourniquets in log *  DICTATION: .Dragon Dictation  PLAN OF CARE: Admit for overnight observation  PATIENT DISPOSITION:  PACU - hemodynamically stable.   Delay start of Pharmacological VTE agent (>24hrs) due to surgical blood loss or risk of bleeding: not applicable Details of procedure:.  Patient was taken the operating room prepped and draped for vaginal procedure with legs in candycane leg support.  Perineum was prepped and draped timeout was conducted and procedure confirmed by surgical team.  Ancef was administered.  A short weighted speculum was placed in the posterior vagina to allow access to the cervix.  The uterus descended almost to the introitus with moderate traction.  The cervix was circumscribed from 8:00 around to 4:00 and the vesicouterine avascular space identified anteriorly and sharply dissected into with Metzenbaum scissors and the bladder elevated away from the cervix.  Posterior colpotomy was performed to identify the cul-de-sac without difficulty.  Curved Zeppelin clamp was used to grasp the left uterosacral ligament which was clamped cut and suture-ligated and tagged with 0 Vicryl.  Unless mentioned otherwise Zeppelin clamps 0 Vicryl suture and  Mayo scissors were used as cutting agent throughout the rest of the case the anteriorly we were able to confirm that we were in the peritoneum anterior and the bladder was well retracted.  Small bites were used to clamped cut and suture-ligated the remainder of the cardinal ligaments on either side with good hemostasis throughout.  The broad ligament was clamped cut and suture-ligated and small bites on either side.  We will the uterus descended steadily.  At this time the uterine cervix and lower uterine segment were amputated off of the upper aspects of the uterine fundus, to allow access to the adnexal structures.  The uterus was rotated sufficiently to identify the right fallopian tube, round ligament and utero-ovarian ligament complex.  It was identified that there was a small proximal hydrosalpinx of the pedicle was grasped in such way to leave the proximal fallopian tube separate.  This was transected, doubly suture-ligated using 0 chromic and good hemostasis achieved.  The pedicle was tagged.  The proximal fallopian tube on the side was inspected and confirmed as having a small thickened hydrosalpinx so it was amputated off using Zeppelin clamp and standard scissors and 0 chromic.  Distal fallopian tube was normal.  There was evidence of recent ovulation on the right on the left side a similar technique was used and there was a firm hard to the hydrosalpinx approximately 1.5 cm in length on the side that was amputated off and included in the specimen.  Ovary on this side appeared grossly normal as did the distal tube.  As per previous preoperative discussions we left the distal tube pedicles were inspected confirmed as hemostatic and released  into the abdomen.  Foley catheter was inserted.  Clear urine was obtained times 200 cc.Marland Kitchen  Peritoneum was closed anteriorly after sponge and needle counts were correct.  The peritoneum was closed using 2-0 chromic across the cuff in a running locking fashion from side  to side the vaginal cuff itself was then reapproximated using a series of interrupted 0 chromic sutures across the cuff.  Good hemostasis and cuff closure was achieved.  The patient has had Marcaine times 4 cc injected in the uterosacral ligaments on either side for improved pain management.  The patient then went to recovery room in stable condition with EBL 110 cc condition to recovery room stable

## 2017-05-30 NOTE — Transfer of Care (Signed)
Immediate Anesthesia Transfer of Care Note  Patient: Dominique Taylor  Procedure(s) Performed: HYSTERECTOMY VAGINAL WITH BILATERAL PARTIAL SALPINGECTOMY (N/A )  Patient Location: PACU  Anesthesia Type:General  Level of Consciousness: drowsy  Airway & Oxygen Therapy: Patient Spontanous Breathing and Patient connected to face mask oxygen  Post-op Assessment: Report given to RN, Post -op Vital signs reviewed and stable and Patient moving all extremities  Post vital signs: Reviewed and stable  Last Vitals:  Vitals:   05/30/17 0720 05/30/17 0725  BP: 111/73 104/73  Pulse:    Resp: (!) 23 19  Temp:    SpO2: 93% 93%    Last Pain:  Vitals:   05/30/17 0635  TempSrc: Oral  PainSc: 0-No pain      Patients Stated Pain Goal: 9 (51/83/35 8251)  Complications: No apparent anesthesia complications

## 2017-05-30 NOTE — Anesthesia Preprocedure Evaluation (Signed)
Anesthesia Evaluation  Patient identified by MRN, date of birth, ID band Patient awake    Reviewed: Allergy & Precautions, NPO status , Patient's Chart, lab work & pertinent test results  Airway Mallampati: I  TM Distance: >3 FB Neck ROM: Full    Dental  (+) Teeth Intact, Dental Advisory Given   Pulmonary Current Smoker,    Pulmonary exam normal breath sounds clear to auscultation       Cardiovascular Exercise Tolerance: Good negative cardio ROS Normal cardiovascular exam Rhythm:Regular Rate:Normal     Neuro/Psych PSYCHIATRIC DISORDERS Anxiety Depression negative neurological ROS     GI/Hepatic negative GI ROS, Neg liver ROS,   Endo/Other  negative endocrine ROS  Renal/GU negative Renal ROS     Musculoskeletal negative musculoskeletal ROS (+)   Abdominal   Peds  Hematology negative hematology ROS (+)   Anesthesia Other Findings   Left breast cancer   Reproductive/Obstetrics                             Anesthesia Physical Anesthesia Plan  ASA: II  Anesthesia Plan: General   Post-op Pain Management:    Induction: Intravenous  PONV Risk Score and Plan:   Airway Management Planned: Oral ETT  Additional Equipment:   Intra-op Plan:   Post-operative Plan: Extubation in OR  Informed Consent: I have reviewed the patients History and Physical, chart, labs and discussed the procedure including the risks, benefits and alternatives for the proposed anesthesia with the patient or authorized representative who has indicated his/her understanding and acceptance.     Plan Discussed with: CRNA  Anesthesia Plan Comments:         Anesthesia Quick Evaluation

## 2017-05-30 NOTE — Op Note (Signed)
Please see the brief operative note for surgical details 

## 2017-05-30 NOTE — Progress Notes (Signed)
Day of Surgery Procedure(s) (LRB): HYSTERECTOMY VAGINAL WITH BILATERAL PARTIAL SALPINGECTOMY (N/A)  Subjective: Patient reports tolerating PO and no problems voiding.  Foley in place  Objective: I have reviewed patient's vital signs.  General: alert and cooperative Vaginal Bleeding: minimal  Assessment: s/p Procedure(s): HYSTERECTOMY VAGINAL WITH BILATERAL PARTIAL SALPINGECTOMY (N/A): stable  Plan: stable ,   LOS: 0 days    Jonnie Kind 05/30/2017, 3:09 PM

## 2017-05-30 NOTE — Anesthesia Procedure Notes (Signed)
Procedure Name: Intubation Date/Time: 05/30/2017 7:40 AM Performed by: Charmaine Downs, CRNA Pre-anesthesia Checklist: Emergency Drugs available, Patient identified, Suction available and Patient being monitored Patient Re-evaluated:Patient Re-evaluated prior to induction Oxygen Delivery Method: Circle system utilized Preoxygenation: Pre-oxygenation with 100% oxygen Induction Type: IV induction Ventilation: Mask ventilation without difficulty and Oral airway inserted - appropriate to patient size Laryngoscope Size: Mac and 3 Grade View: Grade I Tube type: Oral Tube size: 7.0 mm Number of attempts: 1 Airway Equipment and Method: Stylet Placement Confirmation: ETT inserted through vocal cords under direct vision,  positive ETCO2 and breath sounds checked- equal and bilateral Secured at: 22 cm Tube secured with: Tape Dental Injury: Teeth and Oropharynx as per pre-operative assessment

## 2017-05-31 ENCOUNTER — Encounter (HOSPITAL_COMMUNITY): Payer: Self-pay | Admitting: Obstetrics and Gynecology

## 2017-05-31 ENCOUNTER — Other Ambulatory Visit: Payer: Self-pay

## 2017-05-31 DIAGNOSIS — D069 Carcinoma in situ of cervix, unspecified: Secondary | ICD-10-CM | POA: Diagnosis not present

## 2017-05-31 LAB — CBC
HCT: 35.2 % — ABNORMAL LOW (ref 36.0–46.0)
Hemoglobin: 11 g/dL — ABNORMAL LOW (ref 12.0–15.0)
MCH: 33.6 pg (ref 26.0–34.0)
MCHC: 31.3 g/dL (ref 30.0–36.0)
MCV: 107.6 fL — ABNORMAL HIGH (ref 78.0–100.0)
PLATELETS: 228 10*3/uL (ref 150–400)
RBC: 3.27 MIL/uL — ABNORMAL LOW (ref 3.87–5.11)
RDW: 13.4 % (ref 11.5–15.5)
WBC: 7.7 10*3/uL (ref 4.0–10.5)

## 2017-05-31 LAB — BASIC METABOLIC PANEL
ANION GAP: 6 (ref 5–15)
BUN: 10 mg/dL (ref 6–20)
CO2: 24 mmol/L (ref 22–32)
Calcium: 7.6 mg/dL — ABNORMAL LOW (ref 8.9–10.3)
Chloride: 109 mmol/L (ref 101–111)
Creatinine, Ser: 0.43 mg/dL — ABNORMAL LOW (ref 0.44–1.00)
GFR calc Af Amer: >60 mL/min (ref >60)
Glucose, Bld: 120 mg/dL — ABNORMAL HIGH (ref 65–99)
Potassium: 3.8 mmol/L (ref 3.5–5.1)
SODIUM: 139 mmol/L (ref 135–145)

## 2017-05-31 MED ORDER — ORAL CARE MOUTH RINSE
15.0000 mL | Freq: Two times a day (BID) | OROMUCOSAL | Status: DC
  Administered 2017-05-31: 15 mL via OROMUCOSAL

## 2017-05-31 MED ORDER — STERILE WATER FOR IRRIGATION IR SOLN
Status: DC | PRN
  Administered 2017-05-31: 1000 mL

## 2017-05-31 MED ORDER — OXYCODONE-ACETAMINOPHEN 5-325 MG PO TABS: 0.5000 | ORAL_TABLET | ORAL | 0 refills | Status: DC | PRN

## 2017-05-31 MED ORDER — POLYETHYLENE GLYCOL 3350 17 GM/SCOOP PO POWD: 17.0000 g | Freq: Every day | ORAL | 99 refills | Status: DC

## 2017-05-31 MED ORDER — KETOROLAC TROMETHAMINE 10 MG PO TABS: 10.0000 mg | ORAL_TABLET | Freq: Four times a day (QID) | ORAL | 0 refills | Status: DC | PRN

## 2017-05-31 NOTE — Discharge Summary (Signed)
Physician Discharge Summary  Patient ID: Dominique Taylor MRN: [id number] DOB/AGE: [birth year] 42 y.o.  Admit date: 05/30/2017 Discharge date: 05/31/2017  Admission Diagnoses: HSIL, CIN II-III, dysmenorrhea  Discharge Diagnoses: HSIL, CIN ii-III Active Problems:   Status post vaginal hysterectomy   Discharged Condition: good  Hospital Course: Dominique Taylor a 42 y.o.G2P2 here for surgical management of HGSIL - encompassing moderate and severe dysplasia.She is currently on the third day of her menstrual cycle.No significant preoperative concerns.HPV positive. Smokes about 1 pack per day. She is interested in trying nicotine patches to quit smoking.   Per chart review: Papon 02/15/17 Colpo Biopsy: Moderate to severe dysplasia on biopsy, biopsies done 3 times before, snf patient has had a Leep in the past. ECC11/02/18 rOS : PT finds menstrual discomforts of the first 2 days of cycle debilitating, worse since she switched from OCP to tubal ligation.  Proposed surgery:Vaginal Hysterectomy on 05/30/2017     Consults: None  Significant Diagnostic Studies: labs:  CBC Latest Ref Rng & Units 05/31/2017 05/29/2017 04/21/2017  WBC 4.0 - 10.5 K/uL 7.7 9.2 9.2  Hemoglobin 12.0 - 15.0 g/dL 11.0(L) 13.7 12.8  Hematocrit 36.0 - 46.0 % 35.2(L) 41.8 39.8  Platelets 150 - 400 K/uL 228 255 291     Treatments: surgery: vaginal hysterectomy  Discharge Exam: Blood pressure 105/65, pulse 86, temperature 99.1 F (37.3 C), temperature source Oral, resp. rate (!) 25, height 5' 3.5" (1.613 m), weight 115 lb (52.2 kg), last menstrual period 05/22/2017, SpO2 95 %. rates pain as a 3 General appearance: alert, cooperative and no distress Resp: clear to auscultation bilaterally GI: soft, non-tender; bowel sounds normal; no masses,  no organomegaly Extremities: extremities normal, atraumatic, no cyanosis or edema  Disposition: 01-Home or Self Care  Discharge Instructions    Call MD for:   persistant nausea and vomiting   Complete by:  As directed    Call MD for:  severe uncontrolled pain   Complete by:  As directed    Call MD for:  temperature >100.4   Complete by:  As directed    Diet - low sodium heart healthy   Complete by:  As directed    Increase activity slowly   Complete by:  As directed      Allergies as of 05/31/2017      Reactions   Wellbutrin [bupropion] Itching      Medication List    TAKE these medications   albuterol 108 (90 Base) MCG/ACT inhaler Commonly known as:  PROVENTIL HFA;VENTOLIN HFA Inhale 2 puffs into the lungs every 6 (six) hours as needed for wheezing.   escitalopram 20 MG tablet Commonly known as:  LEXAPRO Take 1 tablet (20 mg total) by mouth daily.   ketorolac 10 MG tablet Commonly known as:  TORADOL Take 1 tablet (10 mg total) by mouth every 6 (six) hours as needed (five day limit postop).   metroNIDAZOLE 0.75 % vaginal gel Commonly known as:  METROGEL Place 1 Applicatorful vaginally at bedtime. Apply one applicatorful to vagina QOD after menses for recurrent BV   nicotine 21 mg/24hr patch Commonly known as:  NICODERM CQ Place 1 patch (21 mg total) onto the skin daily.   oxyCODONE-acetaminophen 5-325 MG tablet Commonly known as:  PERCOCET/ROXICET Take 0.5-1 tablets by mouth every 4 (four) hours as needed for moderate pain or severe pain.   polyethylene glycol powder powder Commonly known as:  MIRALAX Take 17 g by mouth daily. To prevent constipation   predniSONE 50  MG tablet Commonly known as:  DELTASONE Take 1 tablet (50 mg total) by mouth daily with breakfast.   prenatal multivitamin Tabs tablet Take 1 tablet by mouth daily at 12 noon.   tamoxifen 20 MG tablet Commonly known as:  NOLVADEX TAKE 1 TABLET BY MOUTH ONCE DAILY What changed:    how much to take  how to take this  when to take this   venlafaxine XR 150 MG 24 hr capsule Commonly known as:  EFFEXOR-XR Take 1 capsule (150 mg total) by mouth  daily with breakfast.      Follow-up Information    Jonnie Kind, MD Follow up in 1 week(s).   Specialties:  Obstetrics and Gynecology, Radiology Contact information: [place] [address] [phone]           Signed: Jonnie Kind 05/31/2017, 7:14 AM

## 2017-05-31 NOTE — Addendum Note (Signed)
Addendum  created 05/31/17 1401 by Charmaine Downs, CRNA   Sign clinical note

## 2017-05-31 NOTE — Anesthesia Postprocedure Evaluation (Signed)
Anesthesia Post Note  Patient: MAYSON STERBENZ  Procedure(s) Performed: HYSTERECTOMY VAGINAL WITH BILATERAL PARTIAL SALPINGECTOMY (N/A )  Patient location during evaluation: Nursing Unit Anesthesia Type: General Level of consciousness: awake and patient cooperative Pain management: pain level controlled Vital Signs Assessment: post-procedure vital signs reviewed and stable Respiratory status: spontaneous breathing, nonlabored ventilation and respiratory function stable Cardiovascular status: blood pressure returned to baseline Postop Assessment: no apparent nausea or vomiting and adequate PO intake Anesthetic complications: no     Last Vitals:  Vitals:   05/31/17 1044 05/31/17 1200  BP:    Pulse:    Resp: 16 16  Temp:    SpO2: 92% 92%    Last Pain:  Vitals:   05/31/17 1200  TempSrc:   PainSc: 2                  Ottie Tillery J

## 2017-05-31 NOTE — Discharge Instructions (Signed)
Abdominal or Vaginal Hysterectomy Abdominal hysterectomy is a surgical procedure to remove the womb (uterus). The uterus is the muscular organ that houses a developing baby. This surgery may be done if:  You have cancer.  You have growths (tumors or fibroids) in the uterus.  You have long-term (chronic) pain.  You are bleeding.  Your uterus has slipped down into your vagina (uterine prolapse).  You have a condition in which the tissue that lines the uterus grows outside of its normal location (endometriosis).  You have an infection in your uterus.  You are having problems with your menstrual cycle.  Depending on why you are having this procedure, you may also have other reproductive organs removed. These could include:  The part of your vagina that connects with your uterus (cervix).  The organs that make eggs (ovaries).  The tubes that connect the ovaries to the uterus (fallopian tubes).  Tell a health care provider about:  Any allergies you have.  All medicines you are taking, including vitamins, herbs, eye drops, creams, and over-the-counter medicines.  Any problems you or family members have had with anesthetic medicines.  Any blood disorders you have.  Any surgeries you have had.  Any medical conditions you have.  Whether you are pregnant or may be pregnant. What are the risks? Generally, this is a safe procedure. However, problems may occur, including:  Bleeding.  Infection.  Allergic reactions to medicines or dyes.  Damage to other structures or organs.  Nerve injury.  Decreased interest in sex or pain during sex.  Blood clots that can break free and travel to your lungs.  What happens before the procedure? Staying hydrated Follow instructions from your health care provider about hydration, which may include:  Up to 2 hours before the procedure - you may continue to drink clear liquids, such as water, clear fruit juice, black coffee, and plain  tea  Eating and drinking restrictions Follow instructions from your health care provider about eating and drinking, which may include:  8 hours before the procedure - stop eating heavy meals or foods such as meat, fried foods, or fatty foods.  6 hours before the procedure - stop eating light meals or foods, such as toast or cereal.  6 hours before the procedure - stop drinking milk or drinks that contain milk.  2 hours before the procedure - stop drinking clear liquids.  Medicines  Ask your health care provider about: ? Changing or stopping your regular medicines. This is especially important if you are taking diabetes medicines or blood thinners. ? Taking medicines such as aspirin and ibuprofen. These medicines can thin your blood. Do not take these medicines before your procedure if your health care provider instructs you not to.  You may be given antibiotic medicine to help prevent infection. Take it as told by your health care provider.  You may be asked to take laxatives to prevent constipation. General instructions  Ask your health care provider how your surgical site will be marked or identified.  You may be asked to shower with a germ-killing soap.  Plan to have someone take you home from the hospital.  Do not use any products that contain nicotine or tobacco, such as cigarettes and e-cigarettes. If you need help quitting, ask your health care provider.  You may have an exam or testing.  You may have a blood or urine sample taken.  You may need to have an enema to clean out your rectum and lower colon.  This procedure can affect the way you feel about yourself. Talk to your health care provider about the physical and emotional changes this procedure may cause. What happens during the procedure?  To lower your risk of infection: ? Your health care team will wash or sanitize their hands. ? Your skin will be washed with soap. ? Hair may be removed from the surgical  area.  An IV tube will be inserted into one of your veins.  You will be given one or more of the following: ? A medicine to help you relax (sedative). ? A medicine to make you fall asleep (general anesthetic).  Tight-fitting (compression) stockings will be placed on your legs to promote circulation.  A thin, flexible tube (catheter) will be inserted to help drain your urine.  The surgeon will make a cut (incision) through the skin in your lower belly. The incision may go side-to-side or up-and-down.  The surgeon will move aside the body tissue that covers your uterus. The surgeon will then carefully take out your uterus along with any of the other organs that need to be removed.  Bleeding will be controlled with clamps or sutures.  The surgeon will close your incision with stitches (sutures), skin glue, or adhesive strips.  A bandage (dressing) will be placed over the incision. The procedure may vary among health care providers and hospitals. What happens after the procedure?  You will be given pain medicine as needed.  Your blood pressure, heart rate, breathing rate, and blood oxygen level will be monitored until the medicines you were given have worn off.  You will need to stay in the hospital to recover for one to two days. Ask your health care provider how long you will need to stay in the hospital after your procedure.  You may have a liquid diet at first. You will most likely return to your usual diet the day after surgery.  You will still have the urinary catheter in place. It will likely be removed the day after surgery.  You may have to wear compression stockings. These stockings help to prevent blood clots and reduce swelling in your legs.  You will be encouraged to walk as soon as possible. You will also use a device or do breathing exercises to keep your lungs clear.  You may need to use a sanitary napkin for vaginal discharge. Summary  Abdominal hysterectomy is a  surgical procedure to remove the womb (uterus). The uterus is the muscular organ that houses a developing baby.  This procedure can affect the way you feel about yourself. Talk to your health care provider about the physical and emotional changes this procedure may cause.  You will be given medicines for pain after the procedure.  You will need to stay in the hospital to recover. Ask your health care provider how long you will need to stay in the hospital after your procedure. This information is not intended to replace advice given to you by your health care provider. Make sure you discuss any questions you have with your health care provider. Document Released: 05/07/2013 Document Revised: 04/20/2016 Document Reviewed: 04/20/2016 Elsevier Interactive Patient Education  2017 [employer].

## 2017-05-31 NOTE — Progress Notes (Signed)
In bed coloring this morning.  Denies pain but using pca pump.  Reports just small amount of bleeding on peripad.  IV removed and to be discharged home.  Husband at bedside

## 2017-06-02 NOTE — Progress Notes (Signed)
[place]  [phone] [phone]  Clinic Follow-up Note   Patient Care Team: Ma Hillock, DO as PCP - General (Family Medicine) Rolm Bookbinder, MD as Consulting Physician (General Surgery) 06/05/2017   CHIEF COMPLAINTS:  Follow up [facility] of the left breast  HISTORY OF PRESENTING ILLNESS (07/29/2016):  Dominique Taylor 42 y.o. female is here because of atypical lobular hyperplasia of the left breast. Pt underwent a diagnostic mammogram on 05/20/2016, which showed calcifications that could be concerning for malignancy. A biopsy was performed on 05/26/2016, which showed ALH. She underwent a lumpectomy on 06/21/2016 and presents to the cancer center for further cancer prevention.   She presents with her mother today. She found a lump on her breast, which is why she went to get a mammogram. She first noticed this lump May 04, 2016. She did notice some nipple discharge, no color. Denies pain. She did well after surgery. Denies swelling. When she's sick she has a lot of SOB, for which she uses an inhaler for. Denies loss of appetite, or any other concerns.   No history of medical problems. She takes Lexapro for anxiety. She still has regular periods. She had a tubal ligation in the past. Her mother had cervical cancer in the past, at the age of 36. Her father has a history of DM and HTN. Denies any other family history of cancer. She smokes 1 ppd, for the past 23 years. She is trying to quit; currently using nicotine patches. Occasional drinker.   GYN HISTORY  Menarchal: age 59 LMP: regular  Contraceptive: Tubal Ligation HRT: n/a  G2P2: One daughter and one son, 60 and 45 yo   CURRENT THERAPY: Tamoxifen once daily started 08/01/16   INTERVAL HISTORY:  Dominique Taylor is here for a follow up. She reports that she is doing well overall. She is compliant with Tamoxifen and has no complaints. Pt underwent a vaginal hysterectomy with a partial  salpingectomy on 05/30/17 performed by Dr.Ferguson. She states that she had this done because she has had abnormal paps for several years. She is due for a mammogram currently and she will schedule soon.   On review of systems, pt denies breast pain, or any other complaints at this time. Pertinent positives are listed and detailed within the above HPI.      MEDICAL HISTORY:  Past Medical History:  Diagnosis Date  . Abnormal Pap smear   . Breast mass, left   . Cancer [facility])    breast cancer  . Depression   . HPV (human papilloma virus) infection     SURGICAL HISTORY: Past Surgical History:  Procedure Laterality Date  . BREAST ENHANCEMENT SURGERY    . BREAST SURGERY     left breast lumpectomy  . COLPOSCOPY    . RADIOACTIVE SEED GUIDED EXCISIONAL BREAST BIOPSY Left 06/21/2016   Procedure: LEFT BREAST RADIOACTIVE SEED GUIDED EXCISIONAL BIOPSY;  Surgeon: Rolm Bookbinder, MD;  Location: [place];  Service: General;  Laterality: Left;  . TUBAL LIGATION    . VAGINAL HYSTERECTOMY N/A 05/30/2017   Procedure: HYSTERECTOMY VAGINAL WITH BILATERAL PARTIAL SALPINGECTOMY;  Surgeon: Jonnie Kind, MD;  Location: [facility];  Service: Gynecology;  Laterality: N/A;    SOCIAL HISTORY: Social History   Socioeconomic History  . Marital status: Legally Separated    Spouse name: Not on file  . Number of children: 2  . Years of education: Not on file  . Highest education level: Not on  file  Social Needs  . Financial resource strain: Not on file  . Food insecurity - worry: Not on file  . Food insecurity - inability: Not on file  . Transportation needs - medical: Not on file  . Transportation needs - non-medical: Not on file  Occupational History  . Not on file  Tobacco Use  . Smoking status: Current Every Day Smoker    Packs/day: 1.00    Years: 23.00    Pack years: 23.00    Types: Cigarettes  . Smokeless tobacco: Never Used  Substance and Sexual Activity  . Alcohol use: No    . Drug use: No  . Sexual activity: Yes    Birth control/protection: Surgical  Other Topics Concern  . Not on file  Social History Narrative   Ms Blackson lives w/her husband & 2 grown children. She works Medical laboratory scientific officer.    FAMILY HISTORY: Family History  Problem Relation Age of Onset  . Arthritis Mother        RA  . Cancer Mother 15       cervical cancer   . Depression Father   . Hypertension Father   . Diabetes Father   . Ulcerative colitis Father   . Hypertension Brother     ALLERGIES:  is allergic to wellbutrin [bupropion].  MEDICATIONS:  Current Outpatient Medications  Medication Sig Dispense Refill  . albuterol (PROVENTIL HFA;VENTOLIN HFA) 108 (90 Base) MCG/ACT inhaler Inhale 2 puffs into the lungs every 6 (six) hours as needed for wheezing. 1 Inhaler 1  . escitalopram (LEXAPRO) 20 MG tablet Take 1 tablet (20 mg total) by mouth daily. 90 tablet 1  . nicotine (NICODERM CQ) 21 mg/24hr patch Place 1 patch (21 mg total) onto the skin daily. 28 patch 0  . Prenatal Vit-Fe Fumarate-FA (PRENATAL MULTIVITAMIN) TABS tablet Take 1 tablet by mouth daily at 12 noon.    . tamoxifen (NOLVADEX) 20 MG tablet TAKE 1 TABLET BY MOUTH ONCE DAILY (Patient taking differently: TAKE 20 MG BY MOUTH ONCE DAILY AT BEDTIME) 90 tablet 3  . venlafaxine XR (EFFEXOR-XR) 150 MG 24 hr capsule Take 1 capsule (150 mg total) by mouth daily with breakfast. 90 capsule 1  . ketorolac (TORADOL) 10 MG tablet Take 1 tablet (10 mg total) by mouth every 6 (six) hours as needed (five day limit postop). (Patient not taking: Reported on 06/05/2017) 20 tablet 0  . oxyCODONE-acetaminophen (PERCOCET/ROXICET) 5-325 MG tablet Take 0.5-1 tablets by mouth every 4 (four) hours as needed for moderate pain or severe pain. (Patient not taking: Reported on 06/05/2017) 20 tablet 0  . polyethylene glycol powder (MIRALAX) powder Take 17 g by mouth daily. To prevent constipation (Patient not taking: Reported on 06/05/2017) 255 g prn   No current  facility-administered medications for this visit.     REVIEW OF SYSTEMS:   Constitutional: Denies fevers, chills or abnormal night sweats (+) hot flashes Eyes: Denies blurriness of vision, double vision or watery eyes Ears, nose, mouth, throat, and face: Denies mucositis or sore throat Respiratory: Denies cough, dyspnea or wheezes  Cardiovascular: Denies palpitation, chest discomfort or lower extremity swelling Gastrointestinal:  Denies nausea, heartburn or change in bowel habits Skin: Denies abnormal skin rashes Lymphatics: Denies new lymphadenopathy or easy bruising Neurological:Denies numbness, tingling or new weaknesses Behavioral/Psych: no new changes (+) mood swings All other systems were reviewed with the patient and are negative.  PHYSICAL EXAMINATION: ECOG PERFORMANCE STATUS: 0 - Asymptomatic  Vitals:   06/05/17 1452  BP: (!) 116/59  Pulse: 96  Resp: 20  Temp: 98.3 F (36.8 C)  SpO2: 97%   Filed Weights   06/05/17 1452  Weight: 116 lb 8 oz (52.8 kg)    GENERAL:alert, no distress and comfortable SKIN: skin color, texture, turgor are normal, no rashes or significant lesions EYES: normal, conjunctiva are pink and non-injected, sclera clear OROPHARYNX:no exudate, no erythema and lips, buccal mucosa, and tongue normal  NECK: supple, thyroid normal size, non-tender, without nodularity LYMPH:  no palpable lymphadenopathy in the cervical, axillary or inguinal LUNGS: clear to auscultation and percussion with normal breathing effort HEART: regular rate & rhythm and no murmurs and no lower extremity edema ABDOMEN:abdomen soft, non-tender and normal bowel sounds Musculoskeletal:no cyanosis of digits and no clubbing  PSYCH: alert & oriented x 3 with fluent speech NEURO: no focal motor/sensory deficits BREASTS: Breast inspection showed them to be symmetrical with no nipple discharge. Palpation of the breasts and axilla revealed no obvious mass that I could appreciate. (+) L  breast incision site well healed (+) b/l implant   LABORATORY DATA:  I have reviewed the data as listed CBC Latest Ref Rng & Units 06/05/2017 05/31/2017 05/29/2017  WBC 3.9 - 10.3 K/uL 8.3 7.7 9.2  Hemoglobin 11.6 - 15.9 g/dL 13.1 11.0(L) 13.7  Hematocrit 34.8 - 46.6 % 39.0 35.2(L) 41.8  Platelets 145 - 400 K/uL 345 228 255   CMP Latest Ref Rng & Units 06/05/2017 05/31/2017 05/29/2017  Glucose 70 - 140 mg/dL 106 120(H) 90  BUN 7 - 26 mg/dL 11 10 17   Creatinine 0.60 - 1.10 mg/dL 0.72 0.43(L) 0.51  Sodium 136 - 145 mmol/L 143 139 137  Potassium 3.3 - 4.7 mmol/L 4.2 3.8 3.9  Chloride 98 - 109 mmol/L 106 109 101  CO2 22 - 29 mmol/L 26 24 24   Calcium 8.4 - 10.4 mg/dL 10.0 7.6(L) 9.3  Total Protein 6.4 - 8.3 g/dL 6.8 - 6.7  Total Bilirubin 0.2 - 1.2 mg/dL <0.2(L) - 0.3  Alkaline Phos 40 - 150 U/L 65 - 53  AST 5 - 34 U/L 15 - 21  ALT 0 - 55 U/L 13 - 16   PATHOLOGY:  Diagnosis 06/21/2016 Breast, lumpectomy, Left - FIBROCYSTIC CHANGES WITH ADENOSIS AND CALCIFICATIONS. - PSEUDOANGIOMATOUS STROMAL HYPERPLASIA ([place]). - HEALING BIOPSY SITE. - THERE IS NO EVIDENCE OF MALIGNANCY. - SEE COMMENT. Microscopic Comment The surgical resection margin(s) of the specimen were inked and microscopically evaluated.  Diagnosis 05/26/2016 Breast, left, needle core biopsy, UIQ - LOBULAR NEOPLASIA (ATYPICAL LOBULAR HYPERPLASIA). - FIBROCYSTIC CHANGES WITH ADENOSIS AND CALCIFICATIONS.  RADIOGRAPHIC STUDIES: I have personally reviewed the radiological images as listed and agreed with the findings in the report.  Diagnostic Mammogram 05/20/2016 IMPRESSION: 1. Palpable abnormality in the left breast is a simple cyst. 2. Calcifications adjacent to the palpable cyst are indeterminate and warrant tissue diagnosis to exclude malignancy.  ASSESSMENT & PLAN:  Ms. Capley is a 42 y.o. premenopausal Caucasian female with:  1. left breast atypical lobular hyperplasia -I discussed her imaging findings and biopsy and  surgical pathology results with her in great details. -We reviewed the natural history of atypical hyperplasia. It is consider a benign breast disease, however it does increase the risk of breast cancer by 3-5 fold. It is considered as a high risk for breast cancer.  -We discussed annual screening mammogram, which will detect early stage breast cancer. She agrees to continue. She is very compliant on screening -Her breast density is grade D. I have recommended a breast  MRI every year or two in addition to annual mammograms in the future for screening, we discussed the cost of MRI, she will think about it  -We also discussed healthy diet and regular exercise, calcium and vitamin D supplement, to reduce her risk of breast cancer -She is on adjuvant tamoxifen to prevent breast cancer, tolerating well overall, with mild hot flash and some mood swing. She is on maximal dose of Lexapro. -Labs reviewed, normal CBC and CMP, normal breast exam.  -I previously discussed that tamoxifen can slow down her metabolism so she should monitor her cholesterol and sugar levels with her PCP.  -Pt underwent a vaginal hysterectomy on 05/30/17 per Dr. Glo Herring. She is doing well.  -She is clinically doing well, exam was unremarkable.  Labs today (06/05/17) are WNL. Her protein level is slightly low. I advised her to eat a little more protein in her diet.  -Due for mammogram 05/2017, she will schedule soon -f/u in 6 months -She knows to contact us if mood swings worsen.    2. Bone health -She has never had a DEXA Scan -Start her on vitamins D and calcium supplements -I discussed tamoxifen can increased her bone density.  3. Smoking cessation -She has history of heavy smoking, is trying to quit, using a nicotine patch -I recommended a smoking cessation class. I will refer her if she would like.   Plan -Continue Tamoxifen  -lab and f/u in 6 months  -mammogram in 05/2017   All questions were answered. The patient  knows to call the clinic with any problems, questions or concerns.  I spent 15 minutes counseling the patient face to face. The total time spent in the appointment was 20 minutes and more than 50% was on counseling.  This document serves as a record of services personally performed by Truitt Merle, MD. It was created on her behalf by Theresia Bough, a trained medical scribe. The creation of this record is based on the scribe's personal observations and the provider's statements to them.   I have reviewed the above documentation for accuracy and completeness, and I agree with the above.    Truitt Merle, MD 06/05/2017

## 2017-06-05 ENCOUNTER — Inpatient Hospital Stay: Payer: BLUE CROSS/BLUE SHIELD

## 2017-06-05 ENCOUNTER — Telehealth: Payer: Self-pay | Admitting: Hematology

## 2017-06-05 ENCOUNTER — Inpatient Hospital Stay: Payer: BLUE CROSS/BLUE SHIELD | Attending: Hematology | Admitting: Hematology

## 2017-06-05 VITALS — BP 116/59 | HR 96 | Temp 98.3°F | Resp 20 | Ht 63.5 in | Wt 116.5 lb

## 2017-06-05 DIAGNOSIS — N6092 Unspecified benign mammary dysplasia of left breast: Secondary | ICD-10-CM

## 2017-06-05 DIAGNOSIS — Z90710 Acquired absence of both cervix and uterus: Secondary | ICD-10-CM

## 2017-06-05 DIAGNOSIS — Z720 Tobacco use: Secondary | ICD-10-CM | POA: Diagnosis not present

## 2017-06-05 LAB — CBC WITH DIFFERENTIAL/PLATELET
Basophils Absolute: 0 10*3/uL (ref 0.0–0.1)
Basophils Relative: 1 %
EOS ABS: 0.1 10*3/uL (ref 0.0–0.5)
Eosinophils Relative: 1 %
HCT: 39 % (ref 34.8–46.6)
HEMOGLOBIN: 13.1 g/dL (ref 11.6–15.9)
LYMPHS ABS: 2 10*3/uL (ref 0.9–3.3)
LYMPHS PCT: 24 %
MCH: 34.1 pg — AB (ref 25.1–34.0)
MCHC: 33.6 g/dL (ref 31.5–36.0)
MCV: 101.5 fL — ABNORMAL HIGH (ref 79.5–101.0)
MONOS PCT: 7 %
Monocytes Absolute: 0.6 10*3/uL (ref 0.1–0.9)
NEUTROS PCT: 67 %
Neutro Abs: 5.6 10*3/uL (ref 1.5–6.5)
Platelets: 345 10*3/uL (ref 145–400)
RBC: 3.84 MIL/uL (ref 3.70–5.45)
RDW: 13.3 % (ref 11.2–16.1)
WBC: 8.3 10*3/uL (ref 3.9–10.3)

## 2017-06-05 LAB — COMPREHENSIVE METABOLIC PANEL
ALT: 13 U/L (ref 0–55)
AST: 15 U/L (ref 5–34)
Albumin: 3.3 g/dL — ABNORMAL LOW (ref 3.5–5.0)
Alkaline Phosphatase: 65 U/L (ref 40–150)
Anion gap: 11 (ref 3–11)
BUN: 11 mg/dL (ref 7–26)
CHLORIDE: 106 mmol/L (ref 98–109)
CO2: 26 mmol/L (ref 22–29)
CREATININE: 0.72 mg/dL (ref 0.60–1.10)
Calcium: 10 mg/dL (ref 8.4–10.4)
GFR calc non Af Amer: >60 mL/min (ref >60)
Glucose, Bld: 106 mg/dL (ref 70–140)
POTASSIUM: 4.2 mmol/L (ref 3.3–4.7)
SODIUM: 143 mmol/L (ref 136–145)
Total Bilirubin: <0.2 mg/dL — ABNORMAL LOW (ref 0.2–1.2)
Total Protein: 6.8 g/dL (ref 6.4–8.3)

## 2017-06-05 NOTE — Telephone Encounter (Signed)
Gave patient avs and calendar with appts per 1/21 los.  °

## 2017-06-06 ENCOUNTER — Encounter: Payer: Self-pay | Admitting: Hematology

## 2017-06-08 ENCOUNTER — Encounter: Payer: Self-pay | Admitting: Obstetrics and Gynecology

## 2017-06-08 ENCOUNTER — Ambulatory Visit (INDEPENDENT_AMBULATORY_CARE_PROVIDER_SITE_OTHER): Payer: BLUE CROSS/BLUE SHIELD | Admitting: Obstetrics and Gynecology

## 2017-06-08 ENCOUNTER — Other Ambulatory Visit: Payer: Self-pay | Admitting: Family Medicine

## 2017-06-08 VITALS — BP 90/60 | HR 94 | Wt 115.4 lb

## 2017-06-08 DIAGNOSIS — Z9889 Other specified postprocedural states: Secondary | ICD-10-CM

## 2017-06-08 DIAGNOSIS — Z09 Encounter for follow-up examination after completed treatment for conditions other than malignant neoplasm: Secondary | ICD-10-CM

## 2017-06-08 DIAGNOSIS — Z716 Tobacco abuse counseling: Secondary | ICD-10-CM

## 2017-06-08 MED ORDER — CIPROFLOXACIN HCL 500 MG PO TABS: 500.0000 mg | ORAL_TABLET | Freq: Two times a day (BID) | ORAL | 0 refills | Status: DC

## 2017-06-08 NOTE — Progress Notes (Signed)
° °  Subjective:  Dominique Taylor is a 42 y.o. female now 9 days status post HYSTERECTOMY VAGINAL WITH BILATERAL PARTIAL SALPINGECTOMY.   Review of Systems Negative except cloudy urine, with occasional blood in it. She denies any burning with urination. She has noticed an increased vaginal discharge.   Diet:   normal   Bowel movements : normal.  The patient is not having any pain.  Objective:  BP 90/60 (BP Location: Right Arm, Patient Position: Sitting, Cuff Size: Small)    Pulse 94    Wt 115 lb 6.4 oz (52.3 kg)    LMP 05/22/2017 (Exact Date)    BMI 20.12 kg/m  General:Well developed, well nourished.  No acute distress. Abdomen: Bowel sounds normal, soft, non-tender. Pelvic Exam:    External Genitalia:  Normal.    Vagina: Top of vagina is stiff and swollen    Cervix: Absent    Uterus: Absent    Adnexa/Bimanual: Normal  Incision(s):   Healing well, no drainage, no erythema, no hernia, no swelling, no dehiscence,     Assessment:  Post-Op 9 days s/p HYSTERECTOMY VAGINAL WITH BILATERAL PARTIAL SALPINGECTOMY   Doing well postoperatively.   Plan:  1.Wound care discussed  2. current medications. Prescribe antibiotic, Cipro for cuff tenderness as precaution re: cuff cellulitis. 3. Activity restrictions: no lifting more than 25 pounds,  4. return to work: 2-3 weeks. 5. Follow up in 4 weeks.    By signing my name below, I, Izna Ahmed, attest that this documentation has been prepared under the direction and in the presence of Jonnie Kind, MD. Electronically Signed: Jabier Gauss, Medical Scribe. 06/08/17. 3:53 PM.  I personally performed the services described in this documentation, which was SCRIBED in my presence. The recorded information has been reviewed and considered accurate. It has been edited as necessary during review. Jonnie Kind, MD

## 2017-06-09 ENCOUNTER — Other Ambulatory Visit: Payer: Self-pay | Admitting: Obstetrics and Gynecology

## 2017-07-06 ENCOUNTER — Ambulatory Visit (INDEPENDENT_AMBULATORY_CARE_PROVIDER_SITE_OTHER): Payer: BLUE CROSS/BLUE SHIELD | Admitting: Obstetrics and Gynecology

## 2017-07-06 ENCOUNTER — Encounter: Payer: Self-pay | Admitting: Obstetrics and Gynecology

## 2017-07-06 VITALS — BP 100/60 | HR 93 | Wt 115.4 lb

## 2017-07-06 DIAGNOSIS — Z029 Encounter for administrative examinations, unspecified: Secondary | ICD-10-CM

## 2017-07-06 DIAGNOSIS — Z9889 Other specified postprocedural states: Secondary | ICD-10-CM

## 2017-07-06 DIAGNOSIS — Z09 Encounter for follow-up examination after completed treatment for conditions other than malignant neoplasm: Secondary | ICD-10-CM

## 2017-07-06 NOTE — Progress Notes (Signed)
Patient ID: Dominique Taylor, female   DOB: Sep 12, 1975, 42 y.o.   MRN: [id number]    Subjective:  Dominique Taylor is a 42 y.o. female now 5 weeks status post HYSTERECTOMY VAGINAL WITH BILATERAL PARTIAL SALPINGECTOMY.   The patient is doing well, overall. She has not tried to have intercourse yet. Review of Systems Negative   Diet:   normal   Bowel movements : still a little hard.  Some pain with movement  Objective:  LMP 05/22/2017 (Exact Date)  General:Well developed, well nourished.  No acute distress. Abdomen: Bowel sounds normal, soft, non-tender. Pelvic Exam:    External Genitalia:  Normal.    Vagina: Still swollen and stiff, but improved since last visit on 06/08/2017    Cervix: Absent    Uterus: Absent    Adnexa/Bimanual: Normal  Incision(s):   Healing well, no drainage, no erythema, no hernia, no swelling, no dehiscence,     Assessment:  Post-Op 5 weeks s/p HYSTERECTOMY VAGINAL WITH BILATERAL PARTIAL SALPINGECTOMY    Doing well postoperatively.   Plan:  1.Wound care discussed   2. . current medications. 3. Activity restrictions: none 4. return to work: March 4th. 5. Follow up PRN.  By signing my name below, I, Margit Banda, attest that this documentation has been prepared under the direction and in the presence of Jonnie Kind, MD. Electronically Signed: Margit Banda, Medical Scribe. 07/06/17. 3:54 PM.  I personally performed the services described in this documentation, which was SCRIBED in my presence. The recorded information has been reviewed and considered accurate. It has been edited as necessary during review. Jonnie Kind, MD

## 2017-08-11 ENCOUNTER — Other Ambulatory Visit: Payer: Self-pay | Admitting: Hematology

## 2017-08-11 DIAGNOSIS — Z1231 Encounter for screening mammogram for malignant neoplasm of breast: Secondary | ICD-10-CM

## 2017-08-11 DIAGNOSIS — N6092 Unspecified benign mammary dysplasia of left breast: Secondary | ICD-10-CM

## 2017-08-31 ENCOUNTER — Ambulatory Visit
Admission: RE | Admit: 2017-08-31 | Discharge: 2017-08-31 | Disposition: A | Discharge status: Home / Self Care | Payer: BLUE CROSS/BLUE SHIELD | Source: Ambulatory Visit | Attending: Hematology | Admitting: Hematology

## 2017-08-31 DIAGNOSIS — Z1231 Encounter for screening mammogram for malignant neoplasm of breast: Secondary | ICD-10-CM

## 2017-08-31 DIAGNOSIS — N6092 Unspecified benign mammary dysplasia of left breast: Secondary | ICD-10-CM

## 2017-10-29 ENCOUNTER — Other Ambulatory Visit: Payer: Self-pay | Admitting: Family Medicine

## 2017-10-29 DIAGNOSIS — F418 Other specified anxiety disorders: Secondary | ICD-10-CM

## 2017-10-30 ENCOUNTER — Other Ambulatory Visit: Payer: Self-pay

## 2017-10-30 DIAGNOSIS — F418 Other specified anxiety disorders: Secondary | ICD-10-CM

## 2017-10-30 MED ORDER — ESCITALOPRAM OXALATE 20 MG PO TABS: 20.0000 mg | ORAL_TABLET | Freq: Every day | ORAL | 0 refills | Status: DC

## 2017-10-30 MED ORDER — VENLAFAXINE HCL ER 150 MG PO CP24: 150.0000 mg | ORAL_CAPSULE | Freq: Every day | ORAL | 0 refills | Status: DC

## 2017-10-31 ENCOUNTER — Other Ambulatory Visit: Payer: Self-pay | Admitting: Family Medicine

## 2017-10-31 DIAGNOSIS — F418 Other specified anxiety disorders: Secondary | ICD-10-CM

## 2017-11-03 ENCOUNTER — Encounter: Payer: BLUE CROSS/BLUE SHIELD | Admitting: Family Medicine

## 2017-11-10 ENCOUNTER — Encounter: Payer: BLUE CROSS/BLUE SHIELD | Admitting: Family Medicine

## 2017-12-04 ENCOUNTER — Other Ambulatory Visit: Payer: BLUE CROSS/BLUE SHIELD

## 2017-12-04 ENCOUNTER — Ambulatory Visit: Payer: BLUE CROSS/BLUE SHIELD | Admitting: Hematology

## 2017-12-07 ENCOUNTER — Other Ambulatory Visit: Payer: Self-pay | Admitting: Family Medicine

## 2017-12-07 DIAGNOSIS — F418 Other specified anxiety disorders: Secondary | ICD-10-CM

## 2017-12-08 MED ORDER — ESCITALOPRAM OXALATE 20 MG PO TABS: 20.0000 mg | ORAL_TABLET | Freq: Every day | ORAL | 0 refills | Status: DC

## 2017-12-08 MED ORDER — VENLAFAXINE HCL ER 150 MG PO CP24: 150.0000 mg | ORAL_CAPSULE | Freq: Every day | ORAL | 0 refills | Status: DC

## 2017-12-08 NOTE — Telephone Encounter (Signed)
Spoke with patient scheduled appt. Sent 30 day refill authorization for lexapro and effexor.

## 2017-12-08 NOTE — Telephone Encounter (Signed)
Received refill request for Effexor and Lexapro.  Patient is due for her six-month follow-up on these conditions. -If she is still taking these medicines and requesting continued management, schedule her for follow-up and we can provide a 30-day prescription.

## 2017-12-13 ENCOUNTER — Telehealth: Payer: Self-pay | Admitting: Hematology

## 2017-12-13 NOTE — Telephone Encounter (Signed)
Spoke with patient regarding rescheduling her missed appointment per 7/22 sch msg

## 2017-12-19 ENCOUNTER — Ambulatory Visit: Payer: BLUE CROSS/BLUE SHIELD | Admitting: Family Medicine

## 2017-12-20 ENCOUNTER — Ambulatory Visit: Payer: BLUE CROSS/BLUE SHIELD | Admitting: Family Medicine

## 2018-01-03 NOTE — Progress Notes (Signed)
[place]  [phone] [phone]) [phone]  Clinic Follow-up Note   Patient Care Team: Ma Hillock, DO as PCP - General (Family Medicine) Rolm Bookbinder, MD as Consulting Physician (General Surgery) 01/10/2018   CHIEF COMPLAINTS:  Follow up [facility] of the left breast  HISTORY OF PRESENTING ILLNESS (07/29/2016):  BEANCA Taylor 42 y.o. female is here because of atypical lobular hyperplasia of the left breast. Pt underwent a diagnostic mammogram on 05/20/2016, which showed calcifications that could be concerning for malignancy. A biopsy was performed on 05/26/2016, which showed ALH. She underwent a lumpectomy on 06/21/2016 and presents to the cancer center for further cancer prevention.   She presents with her mother today. She found a lump on her breast, which is why she went to get a mammogram. She first noticed this lump May 04, 2016. She did notice some nipple discharge, no color. Denies pain. She did well after surgery. Denies swelling. When she's sick she has a lot of SOB, for which she uses an inhaler for. Denies loss of appetite, or any other concerns.   No history of medical problems. She takes Lexapro for anxiety. She still has regular periods. She had a tubal ligation in the past. Her mother had cervical cancer in the past, at the age of 55. Her father has a history of DM and HTN. Denies any other family history of cancer. She smokes 1 ppd, for the past 23 years. She is trying to quit; currently using nicotine patches. Occasional drinker.   GYN HISTORY  Menarchal: age 45 LMP: regular  Contraceptive: Tubal Ligation HRT: n/a  G2P2: One daughter and one son, 16 and 50 yo   CURRENT THERAPY: Tamoxifen once daily started 08/01/16   INTERVAL HISTORY:  BRIENA Taylor is here for a follow up.  She is here alone. She is doing well, but complains of loss of appetite and weight loss. She relates that to stress due to having custody of her 72-month  old grandson. She is not sure if she's depressed or just anxious. She eats snacks and dinner only. She is tolerating Tamoxifen well with tolerable hot flashes.  She goes to work and has no one to help her at home.     MEDICAL HISTORY:  Past Medical History:  Diagnosis Date  . Abnormal Pap smear   . Breast mass, left   . [facility])    breast cancer  . Depression   . HPV (human papilloma virus) infection     SURGICAL HISTORY: Past Surgical History:  Procedure Laterality Date  . AUGMENTATION MAMMAPLASTY    . BREAST ENHANCEMENT SURGERY    . BREAST EXCISIONAL BIOPSY    . BREAST SURGERY     left breast lumpectomy  . COLPOSCOPY    . RADIOACTIVE SEED GUIDED EXCISIONAL BREAST BIOPSY Left 06/21/2016   Procedure: LEFT BREAST RADIOACTIVE SEED GUIDED EXCISIONAL BIOPSY;  Surgeon: Rolm Bookbinder, MD;  Location: [facility];  Service: General;  Laterality: Left;  . TUBAL LIGATION    . VAGINAL HYSTERECTOMY N/A 05/30/2017   Procedure: HYSTERECTOMY VAGINAL WITH BILATERAL PARTIAL SALPINGECTOMY;  Surgeon: Jonnie Kind, MD;  Location: [facility];  Service: Gynecology;  Laterality: N/A;    SOCIAL HISTORY: Social History   Socioeconomic History  . Marital status: Legally Separated    Spouse name: Not on file  . Number of children: 2  . Years of education: Not on file  . Highest education level: Not on file  Occupational  History  . Not on file  Social Needs  . Financial resource strain: Not on file  . Food insecurity:    Worry: Not on file    Inability: Not on file  . Transportation needs:    Medical: Not on file    Non-medical: Not on file  Tobacco Use  . Smoking status: Current Every Day Smoker    Packs/day: 1.00    Years: 23.00    Pack years: 23.00    Types: Cigarettes  . Smokeless tobacco: Never Used  Substance and Sexual Activity  . Alcohol use: No  . Drug use: No  . Sexual activity: Not Currently    Birth control/protection: Surgical  Lifestyle  . Physical  activity:    Days per week: Not on file    Minutes per session: Not on file  . Stress: Not on file  Relationships  . Social connections:    Talks on phone: Not on file    Gets together: Not on file    Attends religious service: Not on file    Active member of club or organization: Not on file    Attends meetings of clubs or organizations: Not on file    Relationship status: Not on file  . Intimate partner violence:    Fear of current or ex partner: Not on file    Emotionally abused: Not on file    Physically abused: Not on file    Forced sexual activity: Not on file  Other Topics Concern  . Not on file  Social History Narrative   Dominique Taylor lives w/her husband & 2 grown children. She works Medical laboratory scientific officer.    FAMILY HISTORY: Family History  Problem Relation Age of Onset  . Arthritis Mother        RA  . Cancer Mother 36       cervical cancer   . Depression Father   . Hypertension Father   . Diabetes Father   . Ulcerative colitis Father   . Hypertension Brother   . Breast cancer Maternal Aunt     ALLERGIES:  is allergic to wellbutrin [bupropion].  MEDICATIONS:  Current Outpatient Medications  Medication Sig Dispense Refill  . albuterol (PROVENTIL HFA;VENTOLIN HFA) 108 (90 Base) MCG/ACT inhaler Inhale 2 puffs into the lungs every 6 (six) hours as needed for wheezing. 1 Inhaler 1  . escitalopram (LEXAPRO) 20 MG tablet Take 1 tablet (20 mg total) by mouth daily. 30 tablet 0  . nicotine (NICODERM CQ - DOSED IN MG/24 HOURS) 21 mg/24hr patch APPLY 1 PATCH TOPICALLY ONCE DAILY 28 patch 0  . Prenatal Vit-Fe Fumarate-FA (PRENATAL MULTIVITAMIN) TABS tablet Take 1 tablet by mouth daily at 12 noon.    . tamoxifen (NOLVADEX) 20 MG tablet TAKE 20 MG BY MOUTH ONCE DAILY AT BEDTIME 90 tablet 3  . venlafaxine XR (EFFEXOR-XR) 150 MG 24 hr capsule Take 1 capsule (150 mg total) by mouth daily with breakfast. Patient needs follow up appointment 30 capsule 0   No current facility-administered  medications for this visit.     REVIEW OF SYSTEMS:   Constitutional: Denies fevers, chills or abnormal night sweats (+) loss of appetite and weight loss (+) hot flashes Eyes: Denies blurriness of vision, double vision or watery eyes Ears, nose, mouth, throat, and face: Denies mucositis or sore throat Respiratory: Denies cough, dyspnea or wheezes  Cardiovascular: Denies palpitation, chest discomfort or lower extremity swelling Gastrointestinal:  Denies nausea, heartburn or change in bowel habits Skin: Denies abnormal skin  rashes Lymphatics: Denies new lymphadenopathy or easy bruising Neurological:Denies numbness, tingling or new weaknesses Behavioral/Psych: no new changes (+) mood swings, depression, anxiety  All other systems were reviewed with the patient and are negative.  PHYSICAL EXAMINATION: ECOG PERFORMANCE STATUS: 0 - Asymptomatic  Vitals:   01/10/18 1307  BP: 116/75  Pulse: 92  Resp: 17  Temp: 98.7 F (37.1 C)  SpO2: 98%   Filed Weights   01/10/18 1307  Weight: 109 lb 3.2 oz (49.5 kg)    GENERAL:alert, no distress and comfortable SKIN: skin color, texture, turgor are normal, no rashes or significant lesions EYES: normal, conjunctiva are pink and non-injected, sclera clear OROPHARYNX:no exudate, no erythema and lips, buccal mucosa, and tongue normal  NECK: supple, thyroid normal size, non-tender, without nodularity LYMPH:  no palpable lymphadenopathy in the cervical, axillary or inguinal LUNGS: clear to auscultation and percussion with normal breathing effort HEART: regular rate & rhythm and no murmurs and no lower extremity edema ABDOMEN:abdomen soft, non-tender and normal bowel sounds Musculoskeletal:no cyanosis of digits and no clubbing  PSYCH: alert & oriented x 3 with fluent speech NEURO: no focal motor/sensory deficits BREASTS: Breast inspection showed them to be symmetrical with no nipple discharge. Palpation of the breasts and axilla revealed no obvious mass  that I could appreciate. (+) L breast incision site well healed (+) b/l implant   LABORATORY DATA:  I have reviewed the data as listed CBC Latest Ref Rng & Units 01/10/2018 06/05/2017 05/31/2017  WBC 3.9 - 10.3 K/uL 7.9 8.3 7.7  Hemoglobin 11.6 - 15.9 g/dL 13.8 13.1 11.0(L)  Hematocrit 34.8 - 46.6 % 40.2 39.0 35.2(L)  Platelets 145 - 400 K/uL 301 345 228   CMP Latest Ref Rng & Units 01/10/2018 06/05/2017 05/31/2017  Glucose 70 - 99 mg/dL 106(H) 106 120(H)  BUN 6 - 20 mg/dL 18 11 10   Creatinine 0.44 - 1.00 mg/dL 0.65 0.72 0.43(L)  Sodium 135 - 145 mmol/L 143 143 139  Potassium 3.5 - 5.1 mmol/L 3.8 4.2 3.8  Chloride 98 - 111 mmol/L 107 106 109  CO2 22 - 32 mmol/L 28 26 24   Calcium 8.9 - 10.3 mg/dL 8.9 10.0 7.6(L)  Total Protein 6.5 - 8.1 g/dL 6.9 6.8 -  Total Bilirubin 0.3 - 1.2 mg/dL <0.2(L) <0.2(L) -  Alkaline Phos 38 - 126 U/L 67 65 -  AST 15 - 41 U/L 18 15 -  ALT 0 - 44 U/L 11 13 -     PATHOLOGY:  Diagnosis 06/21/2016 Breast, lumpectomy, Left - FIBROCYSTIC CHANGES WITH ADENOSIS AND CALCIFICATIONS. - PSEUDOANGIOMATOUS STROMAL HYPERPLASIA ([place]). - HEALING BIOPSY SITE. - THERE IS NO EVIDENCE OF MALIGNANCY. - SEE COMMENT. Microscopic Comment The surgical resection margin(s) of the specimen were inked and microscopically evaluated.  Diagnosis 05/26/2016 Breast, left, needle core biopsy, UIQ - LOBULAR NEOPLASIA (ATYPICAL LOBULAR HYPERPLASIA). - FIBROCYSTIC CHANGES WITH ADENOSIS AND CALCIFICATIONS.  RADIOGRAPHIC STUDIES: I have personally reviewed the radiological images as listed and agreed with the findings in the report.  09/01/2017 Mammogram IMPRESSION: No mammographic evidence of malignancy. A result letter of this screening mammogram will be mailed directly to the patient.  Diagnostic Mammogram 05/20/2016 IMPRESSION: 1. Palpable abnormality in the left breast is a simple cyst. 2. Calcifications adjacent to the palpable cyst are indeterminate and warrant tissue diagnosis  to exclude malignancy.  ASSESSMENT & PLAN:  Dominique. Cannella is a 42 y.o. premenopausal Caucasian female with:  1. left breast atypical lobular hyperplasia -I discussed her imaging findings and biopsy and  surgical pathology results with her in great details. -We reviewed the natural history of atypical hyperplasia. It is consider a benign breast disease, however it does increase the risk of breast cancer by 3-5 fold. It is considered as a high risk for breast cancer.  -We discussed annual screening mammogram, which will detect early stage breast cancer. She agrees to continue. She is very compliant on screening -Her breast density is grade D. I have recommended a breast MRI every year or two in addition to annual mammograms in the future for screening, we discussed the cost of MRI, she will think about it  -We also discussed healthy diet and regular exercise, calcium and vitamin D supplement, to reduce her risk of breast cancer -She is on adjuvant tamoxifen to prevent breast cancer, tolerating well overall, with mild hot flash and some mood swing. She is on maximal dose of Lexapro and Effexor also. -Labs reviewed today, normal CBC and CMP, normal breast exam. She is clinically stable and doing well.  No clinical concern for recurrence.  Her last screening mammogram in April 2019 was unremarkable. -Pt underwent a partial vaginal hysterectomy on 05/30/17 per Dr. Glo Herring. She is not sure if she is postmenopausal, she has hot flash most likely secondary to tamoxifen. -We will continue breast cancer surveillance, next mammogram 08/2018 -f/u in 6 months  -Refilled Tamoxifen today    2. Bone health -She has never had a DEXA Scan -She takes a multivitamin -I discussed tamoxifen can increased her bone density.  3. Smoking cessation -She has history of heavy smoking, is trying to quit, using a nicotine patch -I recommended a smoking cessation class. I will refer her if she would like.   4. Depression and  weight loss -She has been under a lot of stress, due to her custody of 30 months old granddaughter -She has lost some weight, little interest in eating, and feels depressed -She is on Lexapro and Effexor, managed by her primary care physician -I sent a message to our social worker, to contact and counseling her  Plan -Continue Tamoxifen, refilled today   -lab and f/u in 6 months  -mammogram in early 2020   All questions were answered. The patient knows to call the clinic with any problems, questions or concerns.  I spent 20 minutes counseling the patient face to face. The total time spent in the appointment was 25 minutes and more than 50% was on counseling.  Dierdre Searles Dweik am acting as scribe for Dr. Truitt Merle.  I have reviewed the above documentation for accuracy and completeness, and I agree with the above.     Truitt Merle, MD 01/10/2018

## 2018-01-06 ENCOUNTER — Other Ambulatory Visit: Payer: Self-pay | Admitting: Family Medicine

## 2018-01-06 DIAGNOSIS — F418 Other specified anxiety disorders: Secondary | ICD-10-CM

## 2018-01-08 ENCOUNTER — Other Ambulatory Visit: Payer: Self-pay | Admitting: Family Medicine

## 2018-01-08 DIAGNOSIS — F418 Other specified anxiety disorders: Secondary | ICD-10-CM

## 2018-01-08 NOTE — Telephone Encounter (Signed)
receiving request for lexapro refill. Pt has an appt in 2 days, will refill at that time.

## 2018-01-10 ENCOUNTER — Encounter: Payer: Self-pay | Admitting: Hematology

## 2018-01-10 ENCOUNTER — Inpatient Hospital Stay (HOSPITAL_BASED_OUTPATIENT_CLINIC_OR_DEPARTMENT_OTHER): Payer: BLUE CROSS/BLUE SHIELD | Admitting: Hematology

## 2018-01-10 ENCOUNTER — Encounter: Payer: Self-pay | Admitting: Family Medicine

## 2018-01-10 ENCOUNTER — Ambulatory Visit: Payer: BLUE CROSS/BLUE SHIELD | Admitting: Family Medicine

## 2018-01-10 ENCOUNTER — Inpatient Hospital Stay: Payer: BLUE CROSS/BLUE SHIELD | Attending: Hematology

## 2018-01-10 ENCOUNTER — Telehealth: Payer: Self-pay | Admitting: General Practice

## 2018-01-10 ENCOUNTER — Telehealth: Payer: Self-pay

## 2018-01-10 VITALS — BP 108/75 | HR 94 | Temp 98.6°F | Resp 20 | Ht 63.5 in | Wt 109.0 lb

## 2018-01-10 VITALS — BP 116/75 | HR 92 | Temp 98.7°F | Resp 17 | Ht 63.5 in | Wt 109.2 lb

## 2018-01-10 DIAGNOSIS — N6092 Unspecified benign mammary dysplasia of left breast: Secondary | ICD-10-CM

## 2018-01-10 DIAGNOSIS — Z23 Encounter for immunization: Secondary | ICD-10-CM

## 2018-01-10 DIAGNOSIS — Z720 Tobacco use: Secondary | ICD-10-CM | POA: Insufficient documentation

## 2018-01-10 DIAGNOSIS — R634 Abnormal weight loss: Secondary | ICD-10-CM | POA: Insufficient documentation

## 2018-01-10 DIAGNOSIS — R61 Generalized hyperhidrosis: Secondary | ICD-10-CM | POA: Diagnosis not present

## 2018-01-10 DIAGNOSIS — N951 Menopausal and female climacteric states: Secondary | ICD-10-CM

## 2018-01-10 DIAGNOSIS — R630 Anorexia: Secondary | ICD-10-CM | POA: Insufficient documentation

## 2018-01-10 DIAGNOSIS — F329 Major depressive disorder, single episode, unspecified: Secondary | ICD-10-CM | POA: Diagnosis not present

## 2018-01-10 DIAGNOSIS — Z79810 Long term (current) use of selective estrogen receptor modulators (SERMs): Secondary | ICD-10-CM

## 2018-01-10 DIAGNOSIS — F418 Other specified anxiety disorders: Secondary | ICD-10-CM

## 2018-01-10 LAB — CBC WITH DIFFERENTIAL/PLATELET
BASOS ABS: 0.1 10*3/uL (ref 0.0–0.1)
Basophils Relative: 1 %
EOS PCT: 2 %
Eosinophils Absolute: 0.2 10*3/uL (ref 0.0–0.5)
HEMATOCRIT: 40.2 % (ref 34.8–46.6)
Hemoglobin: 13.8 g/dL (ref 11.6–15.9)
LYMPHS ABS: 2.2 10*3/uL (ref 0.9–3.3)
LYMPHS PCT: 28 %
MCH: 34.6 pg — AB (ref 25.1–34.0)
MCHC: 34.2 g/dL (ref 31.5–36.0)
MCV: 101.1 fL — AB (ref 79.5–101.0)
MONO ABS: 0.7 10*3/uL (ref 0.1–0.9)
MONOS PCT: 9 %
NEUTROS ABS: 4.7 10*3/uL (ref 1.5–6.5)
Neutrophils Relative %: 60 %
PLATELETS: 301 10*3/uL (ref 145–400)
RBC: 3.98 MIL/uL (ref 3.70–5.45)
RDW: 13.7 % (ref 11.2–14.5)
WBC: 7.9 10*3/uL (ref 3.9–10.3)

## 2018-01-10 LAB — COMPREHENSIVE METABOLIC PANEL
ALBUMIN: 3.6 g/dL (ref 3.5–5.0)
ALT: 11 U/L (ref 0–44)
ANION GAP: 8 (ref 5–15)
AST: 18 U/L (ref 15–41)
Alkaline Phosphatase: 67 U/L (ref 38–126)
BUN: 18 mg/dL (ref 6–20)
CHLORIDE: 107 mmol/L (ref 98–111)
CO2: 28 mmol/L (ref 22–32)
Calcium: 8.9 mg/dL (ref 8.9–10.3)
Creatinine, Ser: 0.65 mg/dL (ref 0.44–1.00)
GFR calc Af Amer: >60 mL/min (ref >60)
GFR calc non Af Amer: >60 mL/min (ref >60)
GLUCOSE: 106 mg/dL — AB (ref 70–99)
POTASSIUM: 3.8 mmol/L (ref 3.5–5.1)
SODIUM: 143 mmol/L (ref 135–145)
Total Protein: 6.9 g/dL (ref 6.5–8.1)

## 2018-01-10 MED ORDER — VENLAFAXINE HCL ER 75 MG PO CP24: 225.0000 mg | ORAL_CAPSULE | Freq: Every day | ORAL | 1 refills | Status: DC

## 2018-01-10 MED ORDER — TAMOXIFEN CITRATE 20 MG PO TABS: ORAL_TABLET | ORAL | 3 refills | Status: DC

## 2018-01-10 MED ORDER — LORAZEPAM 0.5 MG PO TABS: 0.5000 mg | ORAL_TABLET | Freq: Two times a day (BID) | ORAL | 5 refills | Status: DC | PRN

## 2018-01-10 MED ORDER — ESCITALOPRAM OXALATE 20 MG PO TABS
20.0000 mg | ORAL_TABLET | Freq: Every day | ORAL | 1 refills | Status: DC
Start: 2018-01-10 — End: 2018-06-13

## 2018-01-10 NOTE — Telephone Encounter (Signed)
Printed avs and calender of upcoming appointment. 8/28 los

## 2018-01-10 NOTE — Progress Notes (Signed)
Dominique Taylor , 05-23-1975, 42 y.o., female MRN: [id number] Patient Care Team    Relationship Specialty Notifications Start End  Ma Hillock, DO PCP - General Family Medicine  02/29/16   Rolm Bookbinder, MD Consulting Physician General Surgery  07/29/16     Chief Complaint  Patient presents with  . Depression  . Anxiety    Subjective:  depression and anxiety: Dominique Taylor is a 42 y.o. female present for follow up on her depression and anxiety. She states she is not doing very well lately. She does not have support at home. She has been battling a cancer diagnosis. She also has custody of her 33 year old grandchild from her son and recently a 59 month old grandchild from her daughter. She feels overwhelmed. She is attending court hearings and social workers with home visits for the grandchildren.  Prior note: Patient presents today for follow-up on depression and anxiety. She reports that she is tolerating the Effexor 150 mg daily in the Lexapro. She states that she did go ahead and increase her Lexapro to 20 mg daily about one week ago. She feels that the doses are helping with her anxiety, however she feels she needs more coverage she's having increased irritability. She is going through quite a bit of medical health issues with diagnosis of breast cancer on tamoxifen and cervical dysplasia. Prior note 02/01/2017:  Patient presents today for follow-up on her depression and anxiety after starting Effexor 75 mg daily 4 weeks ago. Her depression screening has improved from 5-0 her anxiety screening has remained approximately the same. Her hot flashes, possibly secondary to tamoxifen use, are unchanged. She states she does note that she is less irritable on the medication, however feel she could use an increased dose. She did decrease her Lexapro to 10 mg daily while tapering up on the Effexor. Prior note 01/04/2017 Patient is doing okay on leapro 20 mg daily. She feels she is  more irritable, short tempered been normal. She also states she has night sweats, she is on tamoxifen secondary to fairly recent diagnosis of atypical lobular hyperplasia of her left breast. She thought she was handling the anxiety well, but being short tempered she is wondering if additional medication is not needed.  Mood disorder: negative 02/29/2016  Depression screen Dominique Taylor 2/9 01/10/2018 02/15/2017 02/01/2017  Decreased Interest - 0 0  Down, Depressed, Hopeless 1 0 0  PHQ - 2 Score 1 0 0  Altered sleeping 3 - 0  Tired, decreased energy 3 - 0  Change in appetite 2 - 0  Feeling bad or failure about yourself  0 - 0  Trouble concentrating 2 - 0  Moving slowly or fidgety/restless 0 - 0  Suicidal thoughts 0 - 0  PHQ-9 Score 11 - 0  Difficult doing work/chores - - Not difficult at all   GAD 7 : Generalized Anxiety Score 01/10/2018 04/28/2017 02/01/2017 02/29/2016  Nervous, Anxious, on Edge 3 1 2 3   Control/stop worrying 3 3 3 3   Worry too much - different things 3 3 3 2   Trouble relaxing 3 3 2 2   Restless 1 0 3 1  Easily annoyed or irritable 3 0 2 3  Afraid - awful might happen 3 0 2 2  Total GAD 7 Score 19 10 17 16   Anxiety Difficulty - Not difficult at all Somewhat difficult -    Allergies  Allergen Reactions  . Wellbutrin [Bupropion] Itching   Social History   Tobacco  Use  . Smoking status: Current Every Day Smoker    Packs/day: 1.00    Years: 23.00    Pack years: 23.00    Types: Cigarettes  . Smokeless tobacco: Never Used  Substance Use Topics  . Alcohol use: No   Past Medical History:  Diagnosis Date  . Abnormal Pap smear   . Breast mass, left   . Cancer [facility])    breast cancer  . Depression   . HPV (human papilloma virus) infection    Past Surgical History:  Procedure Laterality Date  . ABDOMINAL HYSTERECTOMY    . AUGMENTATION MAMMAPLASTY    . BREAST ENHANCEMENT SURGERY    . BREAST EXCISIONAL BIOPSY    . BREAST SURGERY     left breast lumpectomy  .  COLPOSCOPY    . RADIOACTIVE SEED GUIDED EXCISIONAL BREAST BIOPSY Left 06/21/2016   Procedure: LEFT BREAST RADIOACTIVE SEED GUIDED EXCISIONAL BIOPSY;  Surgeon: Rolm Bookbinder, MD;  Location: [facility];  Service: General;  Laterality: Left;  . TUBAL LIGATION    . VAGINAL HYSTERECTOMY N/A 05/30/2017   Procedure: HYSTERECTOMY VAGINAL WITH BILATERAL PARTIAL SALPINGECTOMY;  Surgeon: Jonnie Kind, MD;  Location: [facility];  Service: Gynecology;  Laterality: N/A;   Family History  Problem Relation Age of Onset  . Arthritis Mother        RA  . Cancer Mother 32       cervical cancer   . Depression Father   . Hypertension Father   . Diabetes Father   . Ulcerative colitis Father   . Hypertension Brother   . Breast cancer Maternal Aunt    Allergies as of 01/10/2018      Reactions   Wellbutrin [bupropion] Itching      Medication List        Accurate as of 01/10/18  3:34 PM. Always use your most recent med list.          albuterol 108 (90 Base) MCG/ACT inhaler Commonly known as:  PROVENTIL HFA;VENTOLIN HFA Inhale 2 puffs into the lungs every 6 (six) hours as needed for wheezing.   escitalopram 20 MG tablet Commonly known as:  LEXAPRO Take 1 tablet (20 mg total) by mouth daily.   nicotine 21 mg/24hr patch Commonly known as:  NICODERM CQ - dosed in mg/24 hours APPLY 1 PATCH TOPICALLY ONCE DAILY   prenatal multivitamin Tabs tablet Take 1 tablet by mouth daily at 12 noon.   tamoxifen 20 MG tablet Commonly known as:  NOLVADEX TAKE 20 MG BY MOUTH ONCE DAILY AT BEDTIME   venlafaxine XR 150 MG 24 hr capsule Commonly known as:  EFFEXOR-XR Take 1 capsule (150 mg total) by mouth daily with breakfast. Patient needs follow up appointment       Results for orders placed or performed in visit on 01/10/18 (from the past 24 hour(s))  Comprehensive metabolic panel     Status: Abnormal   Collection Time: 01/10/18  1:27 PM  Result Value Ref Range   Sodium 143 135 - 145  mmol/L   Potassium 3.8 3.5 - 5.1 mmol/L   Chloride 107 98 - 111 mmol/L   CO2 28 22 - 32 mmol/L   Glucose, Bld 106 (H) 70 - 99 mg/dL   BUN 18 6 - 20 mg/dL   Creatinine, Ser 0.65 0.44 - 1.00 mg/dL   Calcium 8.9 8.9 - 10.3 mg/dL   Total Protein 6.9 6.5 - 8.1 g/dL   Albumin 3.6 3.5 - 5.0 g/dL  AST 18 15 - 41 U/L   ALT 11 0 - 44 U/L   Alkaline Phosphatase 67 38 - 126 U/L   Total Bilirubin <0.2 (L) 0.3 - 1.2 mg/dL   GFR calc non Af Amer >60 >60 mL/min   GFR calc Af Amer >60 >60 mL/min   Anion gap 8 5 - 15  CBC with Differential     Status: Abnormal   Collection Time: 01/10/18  1:27 PM  Result Value Ref Range   WBC 7.9 3.9 - 10.3 K/uL   RBC 3.98 3.70 - 5.45 MIL/uL   Hemoglobin 13.8 11.6 - 15.9 g/dL   HCT 40.2 34.8 - 46.6 %   MCV 101.1 (H) 79.5 - 101.0 fL   MCH 34.6 (H) 25.1 - 34.0 pg   MCHC 34.2 31.5 - 36.0 g/dL   RDW 13.7 11.2 - 14.5 %   Platelets 301 145 - 400 K/uL   Neutrophils Relative % 60 %   Neutro Abs 4.7 1.5 - 6.5 K/uL   Lymphocytes Relative 28 %   Lymphs Abs 2.2 0.9 - 3.3 K/uL   Monocytes Relative 9 %   Monocytes Absolute 0.7 0.1 - 0.9 K/uL   Eosinophils Relative 2 %   Eosinophils Absolute 0.2 0.0 - 0.5 K/uL   Basophils Relative 1 %   Basophils Absolute 0.1 0.0 - 0.1 K/uL   No results found.   ROS: Negative, with the exception of above mentioned in HPI   Objective:  BP 108/75 (BP Location: Right Arm, Patient Position: Sitting, Cuff Size: Normal)   Pulse 94   Temp 98.6 F (37 C)   Resp 20   Ht 5' 3.5" (1.613 m)   Wt 109 lb (49.4 kg)   LMP 05/22/2017 (Exact Date)   SpO2 97%   BMI 19.01 kg/m  Body mass index is 19.01 kg/m. Gen: Afebrile. No acute distress. Nontoxic in presentation.  HENT: AT. [place]. MMM.  Eyes:Pupils Equal Round Reactive to light, Extraocular movements intact,  Conjunctiva without redness, discharge or icterus. CV: RRR no murmur, no edema, +2/4 P posterior tibialis pulses Chest: CTAB, no wheeze or crackles Neuro:  Normal gait. PERLA.  EOMi. Alert. Oriented x3  Psych: crying, otherwise Normal affect, dress and demeanor. Normal speech. Normal thought content and judgment. No SI or HI.   Assessment/Plan: Dominique Taylor is a 42 y.o. female present for OV for  Depression with anxiety/night sweats - She is very tearful today. She is overwhelmed and does not have a good support system at home to lean on. She is undergoing breast CA tx and Cervical dysplasia tx. Also obtaining custody of infant grandchildren.  - provided her with 24 crisis lines x2.  - refills provided today for 6 months.  - referral to psychology placed today.  - Increased effexor 225 mg QD - Continue Lexapro to 20 mg daily. - started ativan 0.5mg  BID PRN. [place] reviewed today. Controlled substance contract signed. She was counseled on addictive properties, face to face encounter needed for refill and controlled substances.  - Follow-up 3 months (can be CPE), sooner if needed   Flu shot provided today.    > 25 minutes spent with patient, >50% of time spent face to face counseling  electronically signed by:  Howard Pouch, DO  [place]

## 2018-01-10 NOTE — Patient Instructions (Signed)
I have referred you to [facility], they will call you to schedule.  I have refilled your meds for 6 months total. However, schedule your CPE in 3 months and we will touch base on your response to increase in meds.   Please come fastin g at least 4 hours if possible. You can take your meds.   Effexor increased to 225 mg total (3 of the 75 mg tabs) Added ativan very low dose. You can take up to every 12 hours only if needed for anxiety. Hopefully, once higher dose of Effexor kicks in you will not need often.   I have provided you with a 24 crisis line if you need it. Do not hesitate to come in sooner if needed here as well.    Major Depressive Disorder, Adult Major depressive disorder (MDD) is a mental health condition. MDD often makes you feel sad, hopeless, or helpless. MDD can also cause symptoms in your body. MDD can affect your:  Work.  School.  Relationships.  Other normal activities.  MDD can range from mild to very bad. It may occur once (single episode MDD). It can also occur many times (recurrent MDD). The main symptoms of MDD often include:  Feeling sad, depressed, or irritable most of the time.  Loss of interest.  MDD symptoms also include:  Sleeping too much or too little.  Eating too much or too little.  A change in your weight.  Feeling tired (fatigue) or having low energy.  Feeling worthless.  Feeling guilty.  Trouble making decisions.  Trouble thinking clearly.  Thoughts of suicide or harming others.  Feeling weak.  Feeling agitated.  Keeping yourself from being around other people (isolation).  Follow these instructions at home: Activity  Do these things as told by your doctor: ? Go back to your normal activities. ? Exercise regularly. ? Spend time outdoors. Alcohol  Talk with your doctor about how alcohol can affect your antidepressant medicines.  Do not drink alcohol. Or, limit how much alcohol you drink. ? This means no more than  1 drink a day for nonpregnant women and 2 drinks a day for men. One drink equals one of these:  12 oz of beer.  5 oz of wine.  1 oz of hard liquor. General instructions  Take over-the-counter and prescription medicines only as told by your doctor.  Eat a healthy diet.  Get plenty of sleep.  Find activities that you enjoy. Make time to do them.  Think about joining a support group. Your doctor may be able to suggest a group for you.  Keep all follow-up visits as told by your doctor. This is important. Where to find more information:  [employer] on Mental Illness: ? www.nami.[place]: ? https://carter.com/  National Suicide Prevention Lifeline: ? (813)663-8213. This is free, 24-hour help. Contact a doctor if:  Your symptoms get worse.  You have new symptoms. Get help right away if:  You self-harm.  You see, hear, taste, smell, or feel things that are not present (hallucinate). If you ever feel like you may hurt yourself or others, or have thoughts about taking your own life, get help right away. You can go to your nearest emergency department or call:  Your local emergency services (911 in the [place]  A suicide crisis helpline, such as the National Suicide Prevention Lifeline: ? 9295370782. This is open 24 hours a day.  This information is not intended to replace advice given to  you by your health care provider. Make sure you discuss any questions you have with your health care provider. Document Released: 04/13/2015 Document Revised: 01/17/2016 Document Reviewed: 01/17/2016 Elsevier Interactive Patient Education  2017 [employer].

## 2018-01-10 NOTE — Telephone Encounter (Signed)
[facility] CSW Progress Note  Request from MD to connect w patient, assess for needs/resources/support.  Left VM w patient encouraging her to call CSW.  Edwyna Shell, LCSW Clinical Social Worker Phone:  [phone]

## 2018-02-07 ENCOUNTER — Telehealth: Payer: Self-pay

## 2018-02-07 NOTE — Telephone Encounter (Signed)
Nutrition  Patient identified on Malnutrition screening report for poor appetite and weight loss.    Called patient using number listed and unable to leave a message.  Please refer patient to RD if can be of assistance.  Audiel Scheiber B. Zenia Resides, [place], [place] Registered Dietitian [phone] (pager)

## 2018-04-09 ENCOUNTER — Ambulatory Visit: Payer: Self-pay | Admitting: Clinical

## 2018-06-13 ENCOUNTER — Encounter: Payer: Self-pay | Admitting: Family Medicine

## 2018-06-13 ENCOUNTER — Ambulatory Visit (INDEPENDENT_AMBULATORY_CARE_PROVIDER_SITE_OTHER): Payer: Medicaid Other | Admitting: Family Medicine

## 2018-06-13 VITALS — BP 103/70 | HR 83 | Temp 97.6°F | Resp 16 | Ht 64.0 in | Wt 110.2 lb

## 2018-06-13 DIAGNOSIS — R61 Generalized hyperhidrosis: Secondary | ICD-10-CM | POA: Diagnosis not present

## 2018-06-13 DIAGNOSIS — Z23 Encounter for immunization: Secondary | ICD-10-CM

## 2018-06-13 DIAGNOSIS — F418 Other specified anxiety disorders: Secondary | ICD-10-CM

## 2018-06-13 MED ORDER — ESCITALOPRAM OXALATE 20 MG PO TABS: 20.0000 mg | ORAL_TABLET | Freq: Every day | ORAL | 1 refills | Status: DC

## 2018-06-13 MED ORDER — VENLAFAXINE HCL ER 75 MG PO CP24: 225.0000 mg | ORAL_CAPSULE | Freq: Every day | ORAL | 1 refills | Status: DC

## 2018-06-13 MED ORDER — LORAZEPAM 0.5 MG PO TABS: 0.5000 mg | ORAL_TABLET | Freq: Three times a day (TID) | ORAL | 5 refills | Status: DC | PRN

## 2018-06-13 MED ORDER — ARIPIPRAZOLE 5 MG PO TABS: 5.0000 mg | ORAL_TABLET | Freq: Every day | ORAL | 0 refills | Status: DC

## 2018-06-13 NOTE — Addendum Note (Signed)
Addended by: Caroll Rancher L on: 06/13/2018 11:05 AM   Modules accepted: Orders

## 2018-06-13 NOTE — Patient Instructions (Signed)
Tdap provided today  Continue effexor and lexapro at current doses- refilled.  Continue ativan- can use every 8 hours as needed Added Abilify 5 mg today- Follow up in 3 months- sooner if needed.     Major Depressive Disorder, Adult Major depressive disorder (MDD) is a mental health condition. MDD often makes you feel sad, hopeless, or helpless. MDD can also cause symptoms in your body. MDD can affect your:  Work.  School.  Relationships.  Other normal activities. MDD can range from mild to very bad. It may occur once (single episode MDD). It can also occur many times (recurrent MDD). The main symptoms of MDD often include:  Feeling sad, depressed, or irritable most of the time.  Loss of interest. MDD symptoms also include:  Sleeping too much or too little.  Eating too much or too little.  A change in your weight.  Feeling tired (fatigue) or having low energy.  Feeling worthless.  Feeling guilty.  Trouble making decisions.  Trouble thinking clearly.  Thoughts of suicide or harming others.  Feeling weak.  Feeling agitated.  Keeping yourself from being around other people (isolation). Follow these instructions at home: Activity  Do these things as told by your doctor: ? Go back to your normal activities. ? Exercise regularly. ? Spend time outdoors. Alcohol  Talk with your doctor about how alcohol can affect your antidepressant medicines.  Do not drink alcohol. Or, limit how much alcohol you drink. ? This means no more than 1 drink a day for nonpregnant women and 2 drinks a day for men. One drink equals one of these:  12 oz of beer.  5 oz of wine.  1 oz of hard liquor. General instructions  Take over-the-counter and prescription medicines only as told by your doctor.  Eat a healthy diet.  Get plenty of sleep.  Find activities that you enjoy. Make time to do them.  Think about joining a support group. Your doctor may be able to suggest a  group for you.  Keep all follow-up visits as told by your doctor. This is important. Where to find more information:  [employer] on Mental Illness: ? www.nami.[place]: ? https://carter.com/  National Suicide Prevention Lifeline: ? 669-793-7576. This is free, 24-hour help. Contact a doctor if:  Your symptoms get worse.  You have new symptoms. Get help right away if:  You self-harm.  You see, hear, taste, smell, or feel things that are not present (hallucinate). If you ever feel like you may hurt yourself or others, or have thoughts about taking your own life, get help right away. You can go to your nearest emergency department or call:  Your local emergency services (911 in the [place]  A suicide crisis helpline, such as the National Suicide Prevention Lifeline: ? 434-576-9088. This is open 24 hours a day. This information is not intended to replace advice given to you by your health care provider. Make sure you discuss any questions you have with your health care provider. Document Released: 04/13/2015 Document Revised: 01/17/2016 Document Reviewed: 01/17/2016 Elsevier Interactive Patient Education  2019 [employer].

## 2018-06-13 NOTE — Progress Notes (Signed)
Dominique Taylor , 07/18/1975, 43 y.o., female MRN: [id number] Patient Care Team    Relationship Specialty Notifications Start End  Ma Hillock, DO PCP - General Family Medicine  02/29/16   Rolm Bookbinder, MD Consulting Physician General Surgery  07/29/16     Chief Complaint  Patient presents with  . Follow-up    No concerns or complaints. Needs refills on medications.     Subjective:  depression and anxiety: Dominique Taylor is a 43 y.o. female present for follow up on her depression and anxiety. She reports she is some better since last visit. However she still feels more depression than her usual. She reports compliance with lexapro 20, Effexor 225 and ativan 0.5 BID PRN.  Prior note:  She states she is not doing very well lately. She does not have support at home. She has been battling a cancer diagnosis. She also has custody of her 74 year old grandchild from her son and recently a 37 month old grandchild from her daughter. She feels overwhelmed. She is attending court hearings and social workers with home visits for the grandchildren.  Prior note: Patient presents today for follow-up on depression and anxiety. She reports that she is tolerating the Effexor 150 mg daily in the Lexapro. She states that she did go ahead and increase her Lexapro to 20 mg daily about one week ago. She feels that the doses are helping with her anxiety, however she feels she needs more coverage she's having increased irritability. She is going through quite a bit of medical health issues with diagnosis of breast cancer on tamoxifen and cervical dysplasia. Prior note 02/01/2017:  Patient presents today for follow-up on her depression and anxiety after starting Effexor 75 mg daily 4 weeks ago. Her depression screening has improved from 5-0 her anxiety screening has remained approximately the same. Her hot flashes, possibly secondary to tamoxifen use, are unchanged. She states she does note that she is  less irritable on the medication, however feel she could use an increased dose. She did decrease her Lexapro to 10 mg daily while tapering up on the Effexor. Prior note 01/04/2017 Patient is doing okay on leapro 20 mg daily. She feels she is more irritable, short tempered been normal. She also states she has night sweats, she is on tamoxifen secondary to fairly recent diagnosis of atypical lobular hyperplasia of her left breast. She thought she was handling the anxiety well, but being short tempered she is wondering if additional medication is not needed.  Mood disorder: negative 02/29/2016  Depression screen [facility] 2/9 06/13/2018 01/10/2018 02/15/2017  Decreased Interest 2 - 0  Down, Depressed, Hopeless 3 1 0  PHQ - 2 Score 5 1 0  Altered sleeping 2 3 -  Tired, decreased energy 2 3 -  Change in appetite 3 2 -  Feeling bad or failure about yourself  2 0 -  Trouble concentrating 3 2 -  Moving slowly or fidgety/restless 0 0 -  Suicidal thoughts 0 0 -  PHQ-9 Score 17 11 -  Difficult doing work/chores Somewhat difficult - -   GAD 7 : Generalized Anxiety Score 06/13/2018 01/10/2018 04/28/2017 02/01/2017  Nervous, Anxious, on Edge 2 3 1 2   Control/stop worrying 3 3 3 3   Worry too much - different things 3 3 3 3   Trouble relaxing 3 3 3 2   Restless 2 1 0 3  Easily annoyed or irritable 3 3 0 2  Afraid - awful might happen 3  3 0 2  Total GAD 7 Score 19 19 10 17   Anxiety Difficulty Somewhat difficult - Not difficult at all Somewhat difficult    Allergies  Allergen Reactions  . Wellbutrin [Bupropion] Itching   Social History   Tobacco Use  . Smoking status: Current Every Day Smoker    Packs/day: 1.00    Years: 23.00    Pack years: 23.00    Types: Cigarettes  . Smokeless tobacco: Never Used  Substance Use Topics  . Alcohol use: No   Past Medical History:  Diagnosis Date  . Abnormal Pap smear   . Breast mass, left   . Cancer [facility])    breast cancer  . Depression   . HPV (human  papilloma virus) infection    Past Surgical History:  Procedure Laterality Date  . ABDOMINAL HYSTERECTOMY    . AUGMENTATION MAMMAPLASTY    . BREAST ENHANCEMENT SURGERY    . BREAST EXCISIONAL BIOPSY    . BREAST SURGERY     left breast lumpectomy  . COLPOSCOPY    . RADIOACTIVE SEED GUIDED EXCISIONAL BREAST BIOPSY Left 06/21/2016   Procedure: LEFT BREAST RADIOACTIVE SEED GUIDED EXCISIONAL BIOPSY;  Surgeon: Rolm Bookbinder, MD;  Location: [facility];  Service: General;  Laterality: Left;  . TUBAL LIGATION    . VAGINAL HYSTERECTOMY N/A 05/30/2017   Procedure: HYSTERECTOMY VAGINAL WITH BILATERAL PARTIAL SALPINGECTOMY;  Surgeon: Jonnie Kind, MD;  Location: [facility];  Service: Gynecology;  Laterality: N/A;   Family History  Problem Relation Age of Onset  . Arthritis Mother        RA  . Cancer Mother 37       cervical cancer   . Depression Father   . Hypertension Father   . Diabetes Father   . Ulcerative colitis Father   . Hypertension Brother   . Breast cancer Maternal Aunt    Allergies as of 06/13/2018      Reactions   Wellbutrin [bupropion] Itching      Medication List       Accurate as of June 13, 2018 10:21 AM. Always use your most recent med list.        albuterol 108 (90 Base) MCG/ACT inhaler Commonly known as:  PROVENTIL HFA;VENTOLIN HFA Inhale 2 puffs into the lungs every 6 (six) hours as needed for wheezing.   escitalopram 20 MG tablet Commonly known as:  LEXAPRO Take 1 tablet (20 mg total) by mouth daily.   LORazepam 0.5 MG tablet Commonly known as:  ATIVAN Take 1 tablet (0.5 mg total) by mouth 2 (two) times daily as needed for anxiety.   nicotine 21 mg/24hr patch Commonly known as:  NICODERM CQ - dosed in mg/24 hours APPLY 1 PATCH TOPICALLY ONCE DAILY   prenatal multivitamin Tabs tablet Take 1 tablet by mouth daily at 12 noon.   tamoxifen 20 MG tablet Commonly known as:  NOLVADEX TAKE 20 MG BY MOUTH ONCE DAILY AT BEDTIME     venlafaxine XR 75 MG 24 hr capsule Commonly known as:  EFFEXOR-XR Take 3 capsules (225 mg total) by mouth daily with breakfast. Patient needs follow up appointment       No results found for this or any previous visit (from the past 24 hour(s)). No results found.   ROS: Negative, with the exception of above mentioned in HPI   Objective:  BP 103/70 (BP Location: Right Arm, Patient Position: Sitting, Cuff Size: Normal)   Pulse 83   Temp 97.6 F (  36.4 C) (Oral)   Resp 16   Ht 5\' 4"  (1.626 m)   Wt 110 lb 4 oz (50 kg)   LMP 05/22/2017 (Exact Date)   SpO2 99%   BMI 18.92 kg/m  Body mass index is 18.92 kg/m. Gen: Afebrile. No acute distress. Nontoxic. Thin, pleasant caucasian female.  HENT: AT. [place]. . MMM.  Eyes:Pupils Equal Round Reactive to light, Extraocular movements intact,  Conjunctiva without redness, discharge or icterus. Neck/lymp/endocrine: Supple,no lymphadenopathy CV: RRR  Chest: CTAB, no wheeze or crackles Neuro:  Normal gait. PERLA. EOMi. Alert. Oriented x3 . Psych: Normal affect, dress and demeanor. Normal speech. Normal thought content and judgment.  Assessment/Plan: Dominique Taylor is a 43 y.o. female present for OV for  Depression with anxiety/night sweats - Not as controlled as could be.  She is overwhelmed and does not have a good support system at home to lean on. She is undergoing breast CA tx and Cervical dysplasia tx. Also obtaining custody of infant grandchildren. She has also lost her job through all this.   - provided her with 24 crisis lines x2.  - referral to psychology placed last visit.  - Continue effexor 225 mg QD - Continue Lexapro to 20 mg daily. - increased ativan 0.5mg  TID PRN. NCCS database reviewed 06/13/18 . Controlled substance contract signed. She was counseled on addictive properties, face to face encounter needed for refill and controlled substances.  - Add abilify 5 mg QD - Follow-up 3 months  sooner if needed   tdap provided  today   electronically signed by:  Howard Pouch, DO  [place]

## 2018-06-28 ENCOUNTER — Telehealth: Payer: Self-pay | Admitting: Hematology

## 2018-06-28 NOTE — Telephone Encounter (Signed)
YF call day 2/26 - moved lab/fu to 3/2. Left message. Schedule mailed.

## 2018-07-11 ENCOUNTER — Ambulatory Visit: Payer: BLUE CROSS/BLUE SHIELD | Admitting: Hematology

## 2018-07-11 ENCOUNTER — Other Ambulatory Visit: Payer: BLUE CROSS/BLUE SHIELD

## 2018-07-16 ENCOUNTER — Inpatient Hospital Stay: Payer: Medicaid Other | Attending: Hematology

## 2018-07-16 ENCOUNTER — Inpatient Hospital Stay: Payer: Medicaid Other | Admitting: Hematology

## 2018-07-17 ENCOUNTER — Telehealth: Payer: Self-pay | Admitting: Hematology

## 2018-07-17 NOTE — Telephone Encounter (Signed)
Unable to reach patient per 3/3 sch message - and unable to leave message.

## 2018-07-25 ENCOUNTER — Other Ambulatory Visit: Payer: Self-pay | Admitting: Family Medicine

## 2018-07-25 DIAGNOSIS — Z1231 Encounter for screening mammogram for malignant neoplasm of breast: Secondary | ICD-10-CM

## 2018-08-07 ENCOUNTER — Other Ambulatory Visit: Payer: Self-pay | Admitting: Family Medicine

## 2018-08-25 ENCOUNTER — Other Ambulatory Visit: Payer: Self-pay | Admitting: Family Medicine

## 2018-08-25 DIAGNOSIS — F418 Other specified anxiety disorders: Secondary | ICD-10-CM

## 2018-09-04 ENCOUNTER — Telehealth: Payer: Self-pay

## 2018-09-04 NOTE — Telephone Encounter (Signed)
Called patient, set up [email or web address] visit

## 2018-09-04 NOTE — Telephone Encounter (Signed)
Copied from [place] [id number]. Topic: Appointment Scheduling - Scheduling Inquiry for Clinic >> Sep 03, 2018  5:02 PM Valla Leaver wrote: Reason for CRM: Patient needs assistance with evisit 04/24. Email on file is correct and cell.

## 2018-09-07 ENCOUNTER — Other Ambulatory Visit: Payer: Self-pay

## 2018-09-07 ENCOUNTER — Ambulatory Visit: Payer: Medicaid Other | Admitting: Family Medicine

## 2018-09-09 ENCOUNTER — Other Ambulatory Visit: Payer: Self-pay | Admitting: Family Medicine

## 2018-09-09 DIAGNOSIS — F418 Other specified anxiety disorders: Secondary | ICD-10-CM

## 2018-09-14 ENCOUNTER — Encounter: Payer: Self-pay | Admitting: Family Medicine

## 2018-09-14 ENCOUNTER — Ambulatory Visit (INDEPENDENT_AMBULATORY_CARE_PROVIDER_SITE_OTHER): Payer: Medicaid Other | Admitting: Family Medicine

## 2018-09-14 ENCOUNTER — Other Ambulatory Visit: Payer: Self-pay

## 2018-09-14 VITALS — Ht 64.0 in | Wt 114.0 lb

## 2018-09-14 DIAGNOSIS — F418 Other specified anxiety disorders: Secondary | ICD-10-CM

## 2018-09-14 DIAGNOSIS — R61 Generalized hyperhidrosis: Secondary | ICD-10-CM | POA: Diagnosis not present

## 2018-09-14 MED ORDER — ARIPIPRAZOLE 5 MG PO TABS: 5.0000 mg | ORAL_TABLET | Freq: Every day | ORAL | 1 refills | Status: DC

## 2018-09-14 MED ORDER — LORAZEPAM 0.5 MG PO TABS: 0.5000 mg | ORAL_TABLET | Freq: Three times a day (TID) | ORAL | 5 refills | Status: DC | PRN

## 2018-09-14 MED ORDER — VENLAFAXINE HCL ER 75 MG PO CP24: 225.0000 mg | ORAL_CAPSULE | Freq: Every day | ORAL | 1 refills | Status: DC

## 2018-09-14 MED ORDER — ESCITALOPRAM OXALATE 20 MG PO TABS: 20.0000 mg | ORAL_TABLET | Freq: Every day | ORAL | 1 refills | Status: DC

## 2018-09-14 NOTE — Progress Notes (Signed)
VIRTUAL VISIT VIA VIDEO  I connected with Dominique Taylor on 09/14/18 at  3:20 PM EDT by a video enabled telemedicine application and verified that I am speaking with the correct person using two identifiers. Location patient: Home Location provider: [facility], Office Persons participating in the virtual visit: Patient, Dr. Raoul Pitch and R.Baker, LPN  I discussed the limitations of evaluation and management by telemedicine and the availability of in person appointments. The patient expressed understanding and agreed to proceed.   SUBJECTIVE Chief Complaint  Patient presents with  . Anxiety    Needs refills on medications.   . Depression    HPI:  depression and anxiety: Dominique Taylor is a 43 y.o. female present for follow up on her depression and anxiety. She reports she is definitely "getting there" and doing better since the changes made last visit to her medications. She reports compliance with lexapro 20, Effexor 225, Abilify 5 mg QD and ativan 0.5 TID PRN.  Prior note:  She states she is not doing very well lately. She does not have support at home. She has been battling a cancer diagnosis. She also has custody of her 57 year old grandchild from her son and recently a 56 month old grandchild from her daughter. She feels overwhelmed. She is attending court hearings and social workers with home visits for the grandchildren.  Prior note: Patient presents today for follow-up on depression and anxiety. She reports that she is tolerating the Effexor 150 mg daily in the Lexapro. She states that she did go ahead and increase her Lexapro to 20 mg daily about one week ago. She feels that the doses are helping with her anxiety, however she feels she needs more coverage she's having increased irritability. She is going through quite a bit of medical health issues with diagnosis of breast cancer on tamoxifen and cervical dysplasia. Prior note 02/01/2017:  Patient presents today  for follow-up on her depression and anxiety after starting Effexor 75 mg daily 4 weeks ago. Her depression screening has improved from 5-0 her anxiety screening has remained approximately the same. Her hot flashes, possibly secondary to tamoxifen use, are unchanged. She states she does note that she is less irritable on the medication, however feel she could use an increased dose. She did decrease her Lexapro to 10 mg daily while tapering up on the Effexor. Prior note 01/04/2017 Patient is doing okay on leapro 20 mg daily. She feels she is more irritable, short tempered been normal. She also states she has night sweats, she is on tamoxifen secondary to fairly recent diagnosis of atypical lobular hyperplasia of her left breast. She thought she was handling the anxiety well, but being short tempered she is wondering if additional medication is not needed. ROS: See pertinent positives and negatives per HPI.  Patient Active Problem List   Diagnosis Date Noted  . CIN III (cervical intraepithelial neoplasia grade III) with severe dysplasia 04/21/2017  . Long-term current use of tamoxifen 01/09/2017  . Night sweats 01/09/2017  . Atypical lobular hyperplasia (ALH) of left breast 07/29/2016  . Depression with anxiety 02/29/2016  . Right carotid bruit 02/24/2015  . Tobacco use disorder 07/24/2014    Social History   Tobacco Use  . Smoking status: Current Every Day Smoker    Packs/day: 1.00    Years: 23.00    Pack years: 23.00    Types: Cigarettes  . Smokeless tobacco: Never Used  Substance Use Topics  . Alcohol use: No  Current Outpatient Medications:  .  albuterol (PROVENTIL HFA;VENTOLIN HFA) 108 (90 Base) MCG/ACT inhaler, Inhale 2 puffs into the lungs every 6 (six) hours as needed for wheezing., Disp: 1 Inhaler, Rfl: 1 .  ARIPiprazole (ABILIFY) 5 MG tablet, Take 1 tablet (5 mg total) by mouth daily., Disp: 90 tablet, Rfl: 1 .  escitalopram (LEXAPRO) 20 MG tablet, Take 1 tablet (20 mg total)  by mouth daily., Disp: 90 tablet, Rfl: 1 .  LORazepam (ATIVAN) 0.5 MG tablet, Take 1 tablet (0.5 mg total) by mouth every 8 (eight) hours as needed for anxiety., Disp: 90 tablet, Rfl: 5 .  Prenatal Vit-Fe Fumarate-FA (PRENATAL MULTIVITAMIN) TABS tablet, Take 1 tablet by mouth daily at 12 noon., Disp: , Rfl:  .  tamoxifen (NOLVADEX) 20 MG tablet, TAKE 20 MG BY MOUTH ONCE DAILY AT BEDTIME, Disp: 90 tablet, Rfl: 3 .  venlafaxine XR (EFFEXOR-XR) 75 MG 24 hr capsule, Take 3 capsules (225 mg total) by mouth daily with breakfast., Disp: 270 capsule, Rfl: 1  Allergies  Allergen Reactions  . Wellbutrin [Bupropion] Itching    OBJECTIVE: Ht 5\' 4"  (1.626 m)   Wt 114 lb (51.7 kg)   LMP 05/22/2017 (Exact Date)   BMI 19.57 kg/m  Gen: No acute distress. Nontoxic in appearance. Looks really good today.  HENT: AT. [place].  MMM.  Eyes:Pupils Equal Round Reactive to light, Extraocular movements intact,  Conjunctiva without redness, discharge or icterus. Psych: Normal affect, dress and demeanor. Normal speech. Normal thought content and judgment.  Depression screen [facility] 2/9 09/14/2018 06/13/2018 01/10/2018 02/15/2017 02/01/2017  Decreased Interest 3 2 - 0 0  Down, Depressed, Hopeless 1 3 1  0 0  PHQ - 2 Score 4 5 1  0 0  Altered sleeping 1 2 3  - 0  Tired, decreased energy 1 2 3  - 0  Change in appetite 0 3 2 - 0  Feeling bad or failure about yourself  1 2 0 - 0  Trouble concentrating 1 3 2  - 0  Moving slowly or fidgety/restless 0 0 0 - 0  Suicidal thoughts 0 0 0 - 0  PHQ-9 Score 8 17 11  - 0  Difficult doing work/chores Somewhat difficult Somewhat difficult - - Not difficult at all   GAD 7 : Generalized Anxiety Score 09/14/2018 06/13/2018 01/10/2018 04/28/2017  Nervous, Anxious, on Edge 1 2 3 1   Control/stop worrying 3 3 3 3   Worry too much - different things 3 3 3 3   Trouble relaxing 1 3 3 3   Restless 1 2 1  0  Easily annoyed or irritable 3 3 3  0  Afraid - awful might happen 1 3 3  0  Total GAD 7 Score 13 19 19  10   Anxiety Difficulty Somewhat difficult Somewhat difficult - Not difficult at all     ASSESSMENT AND PLAN: Dominique Taylor is a 43 y.o. female present for  Depression with anxiety/night sweats - Much improved with addition of Abilify and TID PRN ativan.  - referral to psychology placed last visit.  - Continue effexor 225 mg QD - Continue Lexapro to 20 mg daily. - continue  ativan 0.5mg  TID PRN- try to go back to BID if able. NCCS database reviewed 09/14/18  Controlled substance contract signed. She was counseled on addictive properties, face to face encounter needed for refill and controlled substances.  - continue  abilify 5 mg QD - Follow-up 6 months  sooner if needed  Refills provided on all meds above  [facility], DO 09/14/2018

## 2018-11-07 ENCOUNTER — Ambulatory Visit: Payer: Medicaid Other

## 2018-12-21 ENCOUNTER — Telehealth: Payer: Self-pay | Admitting: Hematology

## 2018-12-21 NOTE — Telephone Encounter (Signed)
Called pt per 8/04 sch message - per YF :Anytime is fine, non-urgnet, lab and OV, thanks   Call pt no answer - left message for patient to call back to set up a follow up appt.

## 2019-01-11 ENCOUNTER — Ambulatory Visit (INDEPENDENT_AMBULATORY_CARE_PROVIDER_SITE_OTHER): Payer: Medicaid Other | Admitting: Family Medicine

## 2019-01-11 ENCOUNTER — Other Ambulatory Visit: Payer: Self-pay

## 2019-01-11 ENCOUNTER — Encounter: Payer: Self-pay | Admitting: Family Medicine

## 2019-01-11 DIAGNOSIS — J209 Acute bronchitis, unspecified: Secondary | ICD-10-CM

## 2019-01-11 MED ORDER — PREDNISONE 20 MG PO TABS: ORAL_TABLET | ORAL | 0 refills | Status: DC

## 2019-01-11 MED ORDER — DOXYCYCLINE HYCLATE 100 MG PO TABS: 100.0000 mg | ORAL_TABLET | Freq: Two times a day (BID) | ORAL | 0 refills | Status: DC

## 2019-01-11 MED ORDER — ALBUTEROL SULFATE HFA 108 (90 BASE) MCG/ACT IN AERS: 2.0000 | INHALATION_SPRAY | Freq: Four times a day (QID) | RESPIRATORY_TRACT | 1 refills | Status: DC | PRN

## 2019-01-11 NOTE — Progress Notes (Signed)
VIRTUAL VISIT VIA VIDEO  I connected with Dominique Taylor on 01/11/19 at  4:00 PM EDT by a video enabled telemedicine application and verified that I am speaking with the correct person using two identifiers. Location patient: Home Location provider: [facility], Office Persons participating in the virtual visit: Patient, Dr. Raoul Pitch and R.Baker, LPN  I discussed the limitations of evaluation and management by telemedicine and the availability of in person appointments. The patient expressed understanding and agreed to proceed.   SUBJECTIVE Chief Complaint  Patient presents with  . Nasal Congestion    x1 month. Went to urgent care one month ago and was given Zpack, prednisone, inhaler. Continues to have cough and inhaler is almost out. Denies fever.   . Wheezing    HPI: KHADIJAH Taylor is a 43 y.o. female present for cough.  Patient reports she has had a one-month history of a nasal congestion, cough and wheeze.  She went to a urgent care approximately 1 month ago and was prescribed prednisone and a albuterol inhaler.  At that time she tested positive for rhinovirus and negative for novel coronavirus.  She was not provided with an antibiotic.  She states since that time she continues to have cough that is nonproductive and a nasal congestion.  She feels that her wheezing has progressed.  She denies any shortness of breath.  ROS: See pertinent positives and negatives per HPI.  Patient Active Problem List   Diagnosis Date Noted  . CIN III (cervical intraepithelial neoplasia grade III) with severe dysplasia 04/21/2017  . Long-term current use of tamoxifen 01/09/2017  . Night sweats 01/09/2017  . Atypical lobular hyperplasia (ALH) of left breast 07/29/2016  . Depression with anxiety 02/29/2016  . Right carotid bruit 02/24/2015  . Tobacco use disorder 07/24/2014    Social History   Tobacco Use  . Smoking status: Current Every Day Smoker    Packs/day: 1.00    Years:  23.00    Pack years: 23.00    Types: Cigarettes  . Smokeless tobacco: Never Used  Substance Use Topics  . Alcohol use: No    Current Outpatient Medications:  .  albuterol (PROVENTIL HFA;VENTOLIN HFA) 108 (90 Base) MCG/ACT inhaler, Inhale 2 puffs into the lungs every 6 (six) hours as needed for wheezing., Disp: 1 Inhaler, Rfl: 1 .  ARIPiprazole (ABILIFY) 5 MG tablet, Take 1 tablet (5 mg total) by mouth daily., Disp: 90 tablet, Rfl: 1 .  escitalopram (LEXAPRO) 20 MG tablet, Take 1 tablet (20 mg total) by mouth daily., Disp: 90 tablet, Rfl: 1 .  LORazepam (ATIVAN) 0.5 MG tablet, Take 1 tablet (0.5 mg total) by mouth every 8 (eight) hours as needed for anxiety., Disp: 90 tablet, Rfl: 5 .  Prenatal Vit-Fe Fumarate-FA (PRENATAL MULTIVITAMIN) TABS tablet, Take 1 tablet by mouth daily at 12 noon., Disp: , Rfl:  .  tamoxifen (NOLVADEX) 20 MG tablet, TAKE 20 MG BY MOUTH ONCE DAILY AT BEDTIME, Disp: 90 tablet, Rfl: 3 .  venlafaxine XR (EFFEXOR-XR) 75 MG 24 hr capsule, Take 3 capsules (225 mg total) by mouth daily with breakfast., Disp: 270 capsule, Rfl: 1  Allergies  Allergen Reactions  . Wellbutrin [Bupropion] Itching    OBJECTIVE: Ht 5\' 4"  (1.626 m)   LMP 05/22/2017 (Exact Date)   BMI 19.57 kg/m  Gen: No acute distress. Nontoxic in appearance.  HENT: AT. [place].  MMM.  Eyes:Pupils Equal Round Reactive to light, Extraocular movements intact,  Conjunctiva without redness, discharge or icterus.  CV: No edema Chest: Cough present, no shortness of breath Skin: No rashes, purpura or petechiae.  Neuro:  Alert. Oriented x3  Psych: Normal affect, dress and demeanor. Normal speech. Normal thought content and judgment.  ASSESSMENT AND PLAN: Dominique Taylor is a 43 y.o. female present for  Acute bronchitis with symptoms greater than 10 days Rest, hydrate.  + flonase, mucinex (DM if cough), nettie pot or nasal saline.  Prednisone taper and doxycycline prescribed, take until completed.  If cough  present it can last up to 6-8 weeks.  F/U 2 weeks in person if not improved.    > 15 minutes spent with patient, > 50% of that time face to face    Howard Pouch, DO 01/11/2019

## 2019-01-14 ENCOUNTER — Inpatient Hospital Stay: Payer: Medicaid Other | Attending: Hematology | Admitting: Hematology

## 2019-01-14 ENCOUNTER — Inpatient Hospital Stay: Payer: Medicaid Other

## 2019-01-14 ENCOUNTER — Encounter: Payer: Self-pay | Admitting: Family Medicine

## 2019-01-14 ENCOUNTER — Telehealth: Payer: Self-pay | Admitting: Hematology

## 2019-01-14 NOTE — Telephone Encounter (Signed)
Per 8/31 schedule message patient no show today, see if patient wants telephone visit today or reschedule. Not able to reach patient. Left message re new lab/fu 9/24. Schedule mailed.

## 2019-01-20 ENCOUNTER — Other Ambulatory Visit: Payer: Self-pay | Admitting: Hematology

## 2019-01-20 DIAGNOSIS — N6092 Unspecified benign mammary dysplasia of left breast: Secondary | ICD-10-CM

## 2019-02-04 NOTE — Progress Notes (Signed)
[place]   Telephone:(336) [phone] Fax:([phone]) [phone]   Clinic Follow up Note   Patient Care Team: Ma Hillock, DO as PCP - General (Family Medicine) Rolm Bookbinder, MD as Consulting Physician (General Surgery)  Date of Service:  02/07/2019  CHIEF COMPLAINT: Follow up [facility] of the left breast  CURRENT THERAPY:  Tamoxifen once daily started 08/01/16  INTERVAL HISTORY:  Dominique Taylor is here for a follow up ALT of left breast . She was last seen by me 1 year ago. She presents to the clinic alone.  She is doing well, denies any pain, chest discomfort, or abdominal issues.  She has good appetite and energy level.  The stress in her life has improved lately, and she has gained some weight since last visit which she is happy about.    REVIEW OF SYSTEMS:   Constitutional: Denies fevers, chills or abnormal weight loss Eyes: Denies blurriness of vision Ears, nose, mouth, throat, and face: Denies mucositis or sore throat Respiratory: Denies cough, dyspnea or wheezes Cardiovascular: Denies palpitation, chest discomfort or lower extremity swelling Gastrointestinal:  Denies nausea, heartburn or change in bowel habits Skin: Denies abnormal skin rashes Lymphatics: Denies new lymphadenopathy or easy bruising Neurological:Denies numbness, tingling or new weaknesses Behavioral/Psych: Mood is stable, no new changes  All other systems were reviewed with the patient and are negative.  MEDICAL HISTORY:  Past Medical History:  Diagnosis Date  . Abnormal Pap smear   . Breast mass, left   . Cancer [facility])    breast cancer  . Depression   . HPV (human papilloma virus) infection     SURGICAL HISTORY: Past Surgical History:  Procedure Laterality Date  . ABDOMINAL HYSTERECTOMY    . AUGMENTATION MAMMAPLASTY    . BREAST ENHANCEMENT SURGERY    . BREAST EXCISIONAL BIOPSY    . BREAST SURGERY     left breast lumpectomy  . COLPOSCOPY    . RADIOACTIVE SEED GUIDED  EXCISIONAL BREAST BIOPSY Left 06/21/2016   Procedure: LEFT BREAST RADIOACTIVE SEED GUIDED EXCISIONAL BIOPSY;  Surgeon: Rolm Bookbinder, MD;  Location: [place];  Service: General;  Laterality: Left;  . TUBAL LIGATION    . VAGINAL HYSTERECTOMY N/A 05/30/2017   Procedure: HYSTERECTOMY VAGINAL WITH BILATERAL PARTIAL SALPINGECTOMY;  Surgeon: Jonnie Kind, MD;  Location: [facility];  Service: Gynecology;  Laterality: N/A;    I have reviewed the social history and family history with the patient and they are unchanged from previous note.  ALLERGIES:  is allergic to wellbutrin [bupropion].  MEDICATIONS:  Current Outpatient Medications  Medication Sig Dispense Refill  . albuterol (PROVENTIL HFA;VENTOLIN HFA) 108 (90 Base) MCG/ACT inhaler Inhale 2 puffs into the lungs every 6 (six) hours as needed for wheezing. 1 Inhaler 1  . albuterol (VENTOLIN HFA) 108 (90 Base) MCG/ACT inhaler Inhale 2 puffs into the lungs every 6 (six) hours as needed for wheezing or shortness of breath. 18 g 1  . ARIPiprazole (ABILIFY) 5 MG tablet Take 1 tablet (5 mg total) by mouth daily. 90 tablet 1  . doxycycline (VIBRA-TABS) 100 MG tablet Take 1 tablet (100 mg total) by mouth 2 (two) times daily. 20 tablet 0  . escitalopram (LEXAPRO) 20 MG tablet Take 1 tablet (20 mg total) by mouth daily. 90 tablet 1  . LORazepam (ATIVAN) 0.5 MG tablet Take 1 tablet (0.5 mg total) by mouth every 8 (eight) hours as needed for anxiety. 90 tablet 5  . predniSONE (DELTASONE) 20 MG tablet  60 mg x3d, 40 mg x3d, 20 mg x2d, 10 mg x2d 18 tablet 0  . Prenatal Vit-Fe Fumarate-FA (PRENATAL MULTIVITAMIN) TABS tablet Take 1 tablet by mouth daily at 12 noon.    . tamoxifen (NOLVADEX) 20 MG tablet TAKE 1 TABLET BY MOUTH ONCE DAILY AT BEDTIME 30 tablet 11  . venlafaxine XR (EFFEXOR-XR) 75 MG 24 hr capsule Take 3 capsules (225 mg total) by mouth daily with breakfast. 270 capsule 1   No current facility-administered medications for this visit.      PHYSICAL EXAMINATION: ECOG PERFORMANCE STATUS: 0 - Asymptomatic  Vitals:   02/07/19 0949  BP: 119/69  Pulse: 92  Resp: 18  Temp: 98.5 F (36.9 C)  SpO2: 99%   Filed Weights   02/07/19 0949  Weight: 123 lb (55.8 kg)    GENERAL:alert, no distress and comfortable SKIN: skin color, texture, turgor are normal, no rashes or significant lesions EYES: normal, Conjunctiva are pink and non-injected, sclera clear NECK: supple, thyroid normal size, non-tender, without nodularity LYMPH:  no palpable lymphadenopathy in the cervical, axillary  LUNGS: clear to auscultation and percussion with normal breathing effort HEART: regular rate & rhythm and no murmurs and no lower extremity edema ABDOMEN:abdomen soft, non-tender and normal bowel sounds Musculoskeletal:no cyanosis of digits and no clubbing  NEURO: alert & oriented x 3 with fluent speech, no focal motor/sensory deficits Breasts: Breast inspection showed them to be symmetrical with no nipple discharge. (+) implants in both breasts. Palpation of the breasts and axilla revealed no obvious mass that I could appreciate.   LABORATORY DATA:  I have reviewed the data as listed CBC Latest Ref Rng & Units 02/07/2019 01/10/2018 06/05/2017  WBC 4.0 - 10.5 K/uL 7.4 7.9 8.3  Hemoglobin 12.0 - 15.0 g/dL 13.8 13.8 13.1  Hematocrit 36.0 - 46.0 % 41.3 40.2 39.0  Platelets 150 - 400 K/uL 239 301 345     CMP Latest Ref Rng & Units 01/10/2018 06/05/2017 05/31/2017  Glucose 70 - 99 mg/dL 106(H) 106 120(H)  BUN 6 - 20 mg/dL 18 11 10   Creatinine 0.44 - 1.00 mg/dL 0.65 0.72 0.43(L)  Sodium 135 - 145 mmol/L 143 143 139  Potassium 3.5 - 5.1 mmol/L 3.8 4.2 3.8  Chloride 98 - 111 mmol/L 107 106 109  CO2 22 - 32 mmol/L 28 26 24   Calcium 8.9 - 10.3 mg/dL 8.9 10.0 7.6(L)  Total Protein 6.5 - 8.1 g/dL 6.9 6.8 -  Total Bilirubin 0.3 - 1.2 mg/dL <0.2(L) <0.2(L) -  Alkaline Phos 38 - 126 U/L 67 65 -  AST 15 - 41 U/L 18 15 -  ALT 0 - 44 U/L 11 13 -       RADIOGRAPHIC STUDIES: I have personally reviewed the radiological images as listed and agreed with the findings in the report. No results found.   ASSESSMENT & PLAN:  Dominique Taylor is a 43 y.o. female with   1. left breast atypical lobular hyperplasia -She was found to have [place] in 05/2016. She underwent left lumpecomty on 06/21/16. There was no evidence of malignancy. -We previously reviewed the natural history of atypical hyperplasia. It is consider a benign breast disease, however it does increase the risk of breast cancer by 3-5 fold. It is considered as a high risk for breast cancer.  -We previously discussed annual screening mammogram, which will detect early stage breast cancer. She agrees to continue. She is very compliant on screening -Her breast density is grade C/D. I again recommended a  breast MRI every year or two in addition to annual mammograms in the future for screening, we discussed the cost of MRI, she will think about it.  -she has been on adjuvant tamoxifen since 08/01/16 to prevent breast cancer.  She is tolerating very well, no noticeable side effects. -Lab reviewed, CBC normal, CMP still pending.  Her physical exam today is unremarkable. -Continue tamoxifen for a total of 5 years -Lab and follow-up in 1 year   2. Bone health -She has never had a DEXA Scan -She takes a multivitamin -I discussed tamoxifen can increased her bone density.  3. Smoking cessation -She has history of heavy smoking, is trying to quit, but not sccessful -She still smokes 1 pack a day  4. Depression and weight loss -She was under a lot of stress, due to her custody of 21 months old granddaughter. Things have improved latlely -She has gained some weight lately, less depressed. -Continue Effexor  Plan -She is doing very well -Continue Tamoxifen, refilled today   -lab and f/u with Lacie in 12 month  -mammogram in Sep 2021      No problem-specific Assessment & Plan notes found  for this encounter.   Orders Placed This Encounter  Procedures  . MM Digital Screening W/ Implants    Standing Status:   Future    Standing Expiration Date:   02/07/2020    Order Specific Question:   Reason for Exam (SYMPTOM  OR DIAGNOSIS REQUIRED)    Answer:   screening    Order Specific Question:   Is the patient pregnant?    Answer:   No    Order Specific Question:   Preferred imaging location?    Answer:   [facility]   All questions were answered. The patient knows to call the clinic with any problems, questions or concerns. No barriers to learning was detected. I spent 10 minutes counseling the patient face to face. The total time spent in the appointment was 15 minutes and more than 50% was on counseling and review of test results     Truitt Merle, MD 02/07/2019   I, Joslyn Devon, am acting as scribe for Truitt Merle, MD.   I have reviewed the above documentation for accuracy and completeness, and I agree with the above.

## 2019-02-05 ENCOUNTER — Ambulatory Visit
Admission: RE | Admit: 2019-02-05 | Discharge: 2019-02-05 | Disposition: A | Discharge status: Home / Self Care | Payer: Medicaid Other | Source: Ambulatory Visit | Attending: Family Medicine | Admitting: Family Medicine

## 2019-02-05 ENCOUNTER — Other Ambulatory Visit: Payer: Self-pay

## 2019-02-05 DIAGNOSIS — Z1231 Encounter for screening mammogram for malignant neoplasm of breast: Secondary | ICD-10-CM

## 2019-02-07 ENCOUNTER — Other Ambulatory Visit: Payer: Self-pay

## 2019-02-07 ENCOUNTER — Encounter: Payer: Self-pay | Admitting: Hematology

## 2019-02-07 ENCOUNTER — Inpatient Hospital Stay: Payer: Medicaid Other | Attending: Hematology | Admitting: Hematology

## 2019-02-07 ENCOUNTER — Inpatient Hospital Stay: Payer: Medicaid Other

## 2019-02-07 ENCOUNTER — Telehealth: Payer: Self-pay | Admitting: Hematology

## 2019-02-07 VITALS — BP 119/69 | HR 92 | Temp 98.5°F | Resp 18 | Ht 64.0 in | Wt 123.0 lb

## 2019-02-07 DIAGNOSIS — F329 Major depressive disorder, single episode, unspecified: Secondary | ICD-10-CM | POA: Insufficient documentation

## 2019-02-07 DIAGNOSIS — N6092 Unspecified benign mammary dysplasia of left breast: Secondary | ICD-10-CM

## 2019-02-07 DIAGNOSIS — Z720 Tobacco use: Secondary | ICD-10-CM | POA: Insufficient documentation

## 2019-02-07 LAB — CBC WITH DIFFERENTIAL/PLATELET
Abs Immature Granulocytes: 0.02 10*3/uL (ref 0.00–0.07)
Basophils Absolute: 0.1 10*3/uL (ref 0.0–0.1)
Basophils Relative: 1 %
Eosinophils Absolute: 0.1 10*3/uL (ref 0.0–0.5)
Eosinophils Relative: 2 %
HCT: 41.3 % (ref 36.0–46.0)
Hemoglobin: 13.8 g/dL (ref 12.0–15.0)
Immature Granulocytes: 0 %
Lymphocytes Relative: 34 %
Lymphs Abs: 2.5 10*3/uL (ref 0.7–4.0)
MCH: 35.5 pg — ABNORMAL HIGH (ref 26.0–34.0)
MCHC: 33.4 g/dL (ref 30.0–36.0)
MCV: 106.2 fL — ABNORMAL HIGH (ref 80.0–100.0)
Monocytes Absolute: 0.6 10*3/uL (ref 0.1–1.0)
Monocytes Relative: 8 %
Neutro Abs: 4.1 10*3/uL (ref 1.7–7.7)
Neutrophils Relative %: 55 %
Platelets: 239 10*3/uL (ref 150–400)
RBC: 3.89 MIL/uL (ref 3.87–5.11)
RDW: 13.1 % (ref 11.5–15.5)
WBC: 7.4 10*3/uL (ref 4.0–10.5)
nRBC: 0 % (ref 0.0–0.2)

## 2019-02-07 LAB — COMPREHENSIVE METABOLIC PANEL
ALT: 17 U/L (ref 0–44)
AST: 20 U/L (ref 15–41)
Albumin: 3.7 g/dL (ref 3.5–5.0)
Alkaline Phosphatase: 60 U/L (ref 38–126)
Anion gap: 8 (ref 5–15)
BUN: 18 mg/dL (ref 6–20)
CO2: 27 mmol/L (ref 22–32)
Calcium: 8.7 mg/dL — ABNORMAL LOW (ref 8.9–10.3)
Chloride: 107 mmol/L (ref 98–111)
Creatinine, Ser: 0.67 mg/dL (ref 0.44–1.00)
GFR calc Af Amer: >60 mL/min (ref >60)
GFR calc non Af Amer: >60 mL/min (ref >60)
Glucose, Bld: 69 mg/dL — ABNORMAL LOW (ref 70–99)
Potassium: 3.5 mmol/L (ref 3.5–5.1)
Sodium: 142 mmol/L (ref 135–145)
Total Bilirubin: <0.2 mg/dL — ABNORMAL LOW (ref 0.3–1.2)
Total Protein: 6.7 g/dL (ref 6.5–8.1)

## 2019-02-07 MED ORDER — TAMOXIFEN CITRATE 20 MG PO TABS: ORAL_TABLET | ORAL | 11 refills | Status: DC

## 2019-02-07 NOTE — Telephone Encounter (Signed)
Scheduled appt per 9/24 los.  Spoke with patient and she is aware of the appt date and time.

## 2019-02-08 ENCOUNTER — Ambulatory Visit: Payer: Medicaid Other | Admitting: Family Medicine

## 2019-02-18 ENCOUNTER — Encounter: Payer: Self-pay | Admitting: Family Medicine

## 2019-02-18 ENCOUNTER — Other Ambulatory Visit: Payer: Self-pay

## 2019-02-18 ENCOUNTER — Ambulatory Visit (INDEPENDENT_AMBULATORY_CARE_PROVIDER_SITE_OTHER): Payer: Medicaid Other | Admitting: Family Medicine

## 2019-02-18 VITALS — BP 131/88 | HR 103 | Temp 98.2°F | Resp 19 | Ht 64.0 in | Wt 121.0 lb

## 2019-02-18 DIAGNOSIS — J4530 Mild persistent asthma, uncomplicated: Secondary | ICD-10-CM | POA: Diagnosis not present

## 2019-02-18 DIAGNOSIS — F17200 Nicotine dependence, unspecified, uncomplicated: Secondary | ICD-10-CM

## 2019-02-18 DIAGNOSIS — Z716 Tobacco abuse counseling: Secondary | ICD-10-CM | POA: Diagnosis not present

## 2019-02-18 MED ORDER — NICOTINE 21 MG/24HR TD PT24: 21.0000 mg | MEDICATED_PATCH | Freq: Every day | TRANSDERMAL | 0 refills | Status: DC

## 2019-02-18 MED ORDER — NICOTINE 7 MG/24HR TD PT24: 7.0000 mg | MEDICATED_PATCH | Freq: Every day | TRANSDERMAL | 0 refills | Status: DC

## 2019-02-18 MED ORDER — CETIRIZINE HCL 10 MG PO TABS: 10.0000 mg | ORAL_TABLET | Freq: Every day | ORAL | 3 refills | Status: DC

## 2019-02-18 MED ORDER — NICOTINE 14 MG/24HR TD PT24: 14.0000 mg | MEDICATED_PATCH | Freq: Every day | TRANSDERMAL | 0 refills | Status: DC

## 2019-02-18 MED ORDER — METHYLPREDNISOLONE ACETATE 40 MG/ML IJ SUSP
40.0000 mg | Freq: Once | INTRAMUSCULAR | Status: AC
  Administered 2019-02-18: 80 mg via INTRAMUSCULAR

## 2019-02-18 MED ORDER — ALBUTEROL SULFATE HFA 108 (90 BASE) MCG/ACT IN AERS: 2.0000 | INHALATION_SPRAY | Freq: Four times a day (QID) | RESPIRATORY_TRACT | 1 refills | Status: DC | PRN

## 2019-02-18 MED ORDER — FLUTICASONE PROPIONATE HFA 44 MCG/ACT IN AERO: 1.0000 | INHALATION_SPRAY | Freq: Two times a day (BID) | RESPIRATORY_TRACT | 12 refills | Status: DC

## 2019-02-18 NOTE — Progress Notes (Signed)
SUBJECTIVE Chief Complaint  Patient presents with  . Cough    Pt continues to have cough, wheezing, and nasal congestion. Has finished abx and steroids, using inhalers. Denies fever  . Nasal Congestion  . Wheezing    HPI: Dominique Taylor is a 43 y.o. female present for continue cough, bronchitis like sx and wheezing. She was treated with steroid pack and doxycyline 6 weeks ago. She tested negative for covid at [facility]. She reports improvement in her symptoms when using steroid, then symptoms returned. She is an everyday heavy smoker. She smokes at least 1 pack per day. She would like to try quit again by using patches.   Prior note:  cough.  Patient reports she has had a one-month history of a nasal congestion, cough and wheeze.  She went to a urgent care approximately 1 month ago and was prescribed prednisone and a albuterol inhaler.  At that time she tested positive for rhinovirus and negative for novel coronavirus.  She was not provided with an antibiotic.  She states since that time she continues to have cough that is nonproductive and a nasal congestion.  She feels that her wheezing has progressed.  She denies any shortness of breath.  ROS: See pertinent positives and negatives per HPI.  Patient Active Problem List   Diagnosis Date Noted  . Mild persistent asthma 02/18/2019  . Encounter for smoking cessation counseling 02/18/2019  . CIN III (cervical intraepithelial neoplasia grade III) with severe dysplasia 04/21/2017  . Long-term current use of tamoxifen 01/09/2017  . Night sweats 01/09/2017  . Atypical lobular hyperplasia (ALH) of left breast 07/29/2016  . Depression with anxiety 02/29/2016  . Right carotid bruit 02/24/2015  . Tobacco use disorder 07/24/2014    Social History   Tobacco Use  . Smoking status: Current Every Day Smoker    Packs/day: 1.00    Years: 23.00    Pack years: 23.00    Types: Cigarettes  . Smokeless tobacco: Never Used  Substance Use Topics  .  Alcohol use: No    Current Outpatient Medications:  .  albuterol (VENTOLIN HFA) 108 (90 Base) MCG/ACT inhaler, Inhale 2 puffs into the lungs every 6 (six) hours as needed for wheezing or shortness of breath., Disp: 18 g, Rfl: 1 .  ARIPiprazole (ABILIFY) 5 MG tablet, Take 1 tablet (5 mg total) by mouth daily., Disp: 90 tablet, Rfl: 1 .  escitalopram (LEXAPRO) 20 MG tablet, Take 1 tablet (20 mg total) by mouth daily., Disp: 90 tablet, Rfl: 1 .  LORazepam (ATIVAN) 0.5 MG tablet, Take 1 tablet (0.5 mg total) by mouth every 8 (eight) hours as needed for anxiety., Disp: 90 tablet, Rfl: 5 .  Prenatal Vit-Fe Fumarate-FA (PRENATAL MULTIVITAMIN) TABS tablet, Take 1 tablet by mouth daily at 12 noon., Disp: , Rfl:  .  tamoxifen (NOLVADEX) 20 MG tablet, TAKE 1 TABLET BY MOUTH ONCE DAILY AT BEDTIME, Disp: 30 tablet, Rfl: 11 .  venlafaxine XR (EFFEXOR-XR) 75 MG 24 hr capsule, Take 3 capsules (225 mg total) by mouth daily with breakfast., Disp: 270 capsule, Rfl: 1 .  cetirizine (ZYRTEC) 10 MG tablet, Take 1 tablet (10 mg total) by mouth daily., Disp: 90 tablet, Rfl: 3 .  fluticasone (FLOVENT HFA) 44 MCG/ACT inhaler, Inhale 1-2 puffs into the lungs 2 (two) times daily., Disp: 1 Inhaler, Rfl: 12 .  nicotine (NICODERM CQ - DOSED IN MG/24 HOURS) 14 mg/24hr patch, Place 1 patch (14 mg total) onto the skin daily., Disp: 28  patch, Rfl: 0 .  nicotine (NICODERM CQ - DOSED IN MG/24 HR) 7 mg/24hr patch, Place 1 patch (7 mg total) onto the skin daily., Disp: 28 patch, Rfl: 0 .  nicotine (NICODERM CQ) 21 mg/24hr patch, Place 1 patch (21 mg total) onto the skin daily., Disp: 14 patch, Rfl: 0  Allergies  Allergen Reactions  . Wellbutrin [Bupropion] Itching    OBJECTIVE: BP 131/88 (BP Location: Right Arm, Patient Position: Sitting, Cuff Size: Normal)   Pulse (!) 103   Temp 98.2 F (36.8 C) (Temporal)   Resp 19   Ht 5\' 4"  (1.626 m)   Wt 121 lb (54.9 kg)   LMP 05/22/2017 (Exact Date)   SpO2 94%   BMI 20.77 kg/m   Gen: Afebrile. No acute distress. Smokers cough present . HENT: AT. [place].  Eyes:Pupils Equal Round Reactive to light, Extraocular movements intact,  Conjunctiva without redness, discharge or icterus. Neck/lymp/endocrine: Supple,no lymphadenopathy CV: RRR, no edema Chest: wheezing present diffusely. No crackles. Mild cough. No shortness of breath. Good air movement. Normal WOB  Neuro: Normal gait. PERLA. EOMi. Alert. Orientedx3  Psych: Normal affect, dress and demeanor. Normal speech. Normal thought content and judgment.   ASSESSMENT AND PLAN: Dominique Taylor is a 43 y.o. female present for  Asthma/smoking cessation Rest, hydrate.  + flonase, mucinex (DM if cough), nettie pot or nasal saline.  - im depo medrol 80 today - start flovent 2 puffs BID - albuterol refilled. Discussed ideally this should not be needed routinely. Less than 1-2x a week.  - flu shot 1 week - nurse visit.  - stop smoking- nicotine patches with taper prescribed.  - 4-6 weeks follow up on copd/asthma  > 25 minutes spent with patient, >50% of time spent face to face     Howard Pouch, DO 02/18/2019

## 2019-02-18 NOTE — Patient Instructions (Signed)
- steroid shot today - start flovent 2 puffs every 12 hrs.  - use albuterol only if asthma attack or wheezing.  - start zyrtec nightly- this is called in for you. Also helps with asthma when taken routinely.    - flu shot 1 week - nurse visit.  - 4-6 weeks follow up on copd/asthma   - start nicotine patches and quit smoking. I have called in the taper doses of patches as we dicussed.     Asthma, Adult  Asthma is a long-term (chronic) condition in which the airways get tight and narrow. The airways are the breathing passages that lead from the nose and mouth down into the lungs. A person with asthma will have times when symptoms get worse. These are called asthma attacks. They can cause coughing, whistling sounds when you breathe (wheezing), shortness of breath, and chest pain. They can make it hard to breathe. There is no cure for asthma, but medicines and lifestyle changes can help control it. There are many things that can bring on an asthma attack or make asthma symptoms worse (triggers). Common triggers include:  Mold.  Dust.  Cigarette smoke.  Cockroaches.  Things that can cause allergy symptoms (allergens). These include animal skin flakes (dander) and pollen from trees or grass.  Things that pollute the air. These may include household cleaners, wood smoke, smog, or chemical odors.  Cold air, weather changes, and wind.  Crying or laughing hard.  Stress.  Certain medicines or drugs.  Certain foods such as dried fruit, potato chips, and grape juice.  Infections, such as a cold or the flu.  Certain medical conditions or diseases.  Exercise or tiring activities. Asthma may be treated with medicines and by staying away from the things that cause asthma attacks. Types of medicines may include:  Controller medicines. These help prevent asthma symptoms. They are usually taken every day.  Fast-acting reliever or rescue medicines. These quickly relieve asthma symptoms.  They are used as needed and provide short-term relief.  Allergy medicines if your attacks are brought on by allergens.  Medicines to help control the body's defense (immune) system. Follow these instructions at home: Avoiding triggers in your home  Change your heating and air conditioning filter often.  Limit your use of fireplaces and wood stoves.  Get rid of pests (such as roaches and mice) and their droppings.  Throw away plants if you see mold on them.  Clean your floors. Dust regularly. Use cleaning products that do not smell.  Have someone vacuum when you are not home. Use a vacuum cleaner with a HEPA filter if possible.  Replace carpet with wood, tile, or vinyl flooring. Carpet can trap animal skin flakes and dust.  Use allergy-proof pillows, mattress covers, and box spring covers.  Wash bed sheets and blankets every week in hot water. Dry them in a dryer.  Keep your bedroom free of any triggers.  Avoid pets and keep windows closed when things that cause allergy symptoms are in the air.  Use blankets that are made of polyester or cotton.  Clean bathrooms and kitchens with bleach. If possible, have someone repaint the walls in these rooms with mold-resistant paint. Keep out of the rooms that are being cleaned and painted.  Wash your hands often with soap and water. If soap and water are not available, use hand sanitizer.  Do not allow anyone to smoke in your home. General instructions  Take over-the-counter and prescription medicines only as told by  your doctor. ? Talk with your doctor if you have questions about how or when to take your medicines. ? Make note if you need to use your medicines more often than usual.  Do not use any products that contain nicotine or tobacco, such as cigarettes and e-cigarettes. If you need help quitting, ask your doctor.  Stay away from secondhand smoke.  Avoid doing things outdoors when allergen counts are high and when air  quality is low.  Wear a ski mask when doing outdoor activities in the winter. The mask should cover your nose and mouth. Exercise indoors on cold days if you can.  Warm up before you exercise. Take time to cool down after exercise.  Use a peak flow meter as told by your doctor. A peak flow meter is a tool that measures how well the lungs are working.  Keep track of the peak flow meter's readings. Write them down.  Follow your asthma action plan. This is a written plan for taking care of your asthma and treating your attacks.  Make sure you get all the shots (vaccines) that your doctor recommends. Ask your doctor about a flu shot and a pneumonia shot.  Keep all follow-up visits as told by your doctor. This is important. Contact a doctor if:  You have wheezing, shortness of breath, or a cough even while taking medicine to prevent attacks.  The mucus you cough up (sputum) is thicker than usual.  The mucus you cough up changes from clear or white to yellow, green, gray, or bloody.  You have problems from the medicine you are taking, such as: ? A rash. ? Itching. ? Swelling. ? Trouble breathing.  You need reliever medicines more than 2-3 times a week.  Your peak flow reading is still at 50-79% of your personal best after following the action plan for 1 hour.  You have a fever. Get help right away if:  You seem to be worse and are not responding to medicine during an asthma attack.  You are short of breath even at rest.  You get short of breath when doing very little activity.  You have trouble eating, drinking, or talking.  You have chest pain or tightness.  You have a fast heartbeat.  Your lips or fingernails start to turn blue.  You are light-headed or dizzy, or you faint.  Your peak flow is less than 50% of your personal best.  You feel too tired to breathe normally. Summary  Asthma is a long-term (chronic) condition in which the airways get tight and narrow. An  asthma attack can make it hard to breathe.  Asthma cannot be cured, but medicines and lifestyle changes can help control it.  Make sure you understand how to avoid triggers and how and when to use your medicines. This information is not intended to replace advice given to you by your health care provider. Make sure you discuss any questions you have with your health care provider. Document Released: 10/19/2007 Document Revised: 07/05/2018 Document Reviewed: 06/06/2016 Elsevier Patient Education  2020 [employer].

## 2019-04-01 ENCOUNTER — Encounter: Payer: Self-pay | Admitting: Family Medicine

## 2019-04-01 ENCOUNTER — Other Ambulatory Visit: Payer: Self-pay

## 2019-04-01 ENCOUNTER — Ambulatory Visit (INDEPENDENT_AMBULATORY_CARE_PROVIDER_SITE_OTHER): Payer: Medicaid Other | Admitting: Family Medicine

## 2019-04-01 ENCOUNTER — Other Ambulatory Visit: Payer: Self-pay | Admitting: Family Medicine

## 2019-04-01 VITALS — BP 131/60 | HR 93 | Temp 98.7°F | Resp 17 | Ht 64.0 in | Wt 118.2 lb

## 2019-04-01 DIAGNOSIS — Z23 Encounter for immunization: Secondary | ICD-10-CM | POA: Diagnosis not present

## 2019-04-01 DIAGNOSIS — F17200 Nicotine dependence, unspecified, uncomplicated: Secondary | ICD-10-CM | POA: Diagnosis not present

## 2019-04-01 DIAGNOSIS — F418 Other specified anxiety disorders: Secondary | ICD-10-CM | POA: Diagnosis not present

## 2019-04-01 DIAGNOSIS — J4530 Mild persistent asthma, uncomplicated: Secondary | ICD-10-CM | POA: Diagnosis not present

## 2019-04-01 MED ORDER — VENLAFAXINE HCL ER 75 MG PO CP24: 225.0000 mg | ORAL_CAPSULE | Freq: Every day | ORAL | 1 refills | Status: DC

## 2019-04-01 MED ORDER — FLUTICASONE PROPIONATE HFA 44 MCG/ACT IN AERO: INHALATION_SPRAY | RESPIRATORY_TRACT | 12 refills | Status: DC

## 2019-04-01 MED ORDER — ESCITALOPRAM OXALATE 20 MG PO TABS: 20.0000 mg | ORAL_TABLET | Freq: Every day | ORAL | 1 refills | Status: DC

## 2019-04-01 MED ORDER — ARIPIPRAZOLE 5 MG PO TABS: 5.0000 mg | ORAL_TABLET | Freq: Every day | ORAL | 1 refills | Status: DC

## 2019-04-01 MED ORDER — LORAZEPAM 0.5 MG PO TABS: 0.5000 mg | ORAL_TABLET | Freq: Three times a day (TID) | ORAL | 5 refills | Status: DC | PRN

## 2019-04-01 NOTE — Addendum Note (Signed)
Addended by: Caroll Rancher L on: 04/01/2019 09:40 AM   Modules accepted: Orders

## 2019-04-01 NOTE — Patient Instructions (Addendum)
Follow up in 5.5 mos.  Make sure to use your refill by May 1st if needing.  I have refilled all your meds.   If in 3 months, after stopping smoking and increase in flovent you are still wheezing or needing albuterol more than 2x week>> please follow up then.     Chronic Obstructive Pulmonary Disease Chronic obstructive pulmonary disease (COPD) is a long-term (chronic) lung problem. When you have COPD, it is hard for air to get in and out of your lungs. Usually the condition gets worse over time, and your lungs will never return to normal. There are things you can do to keep yourself as healthy as possible.  Your doctor may treat your condition with: ? Medicines. ? Oxygen. ? Lung surgery.  Your doctor may also recommend: ? Rehabilitation. This includes steps to make your body work better. It may involve a team of specialists. ? Quitting smoking, if you smoke. ? Exercise and changes to your diet. ? Comfort measures (palliative care). Follow these instructions at home: Medicines  Take over-the-counter and prescription medicines only as told by your doctor.  Talk to your doctor before taking any cough or allergy medicines. You may need to avoid medicines that cause your lungs to be dry. Lifestyle  If you smoke, stop. Smoking makes the problem worse. If you need help quitting, ask your doctor.  Avoid being around things that make your breathing worse. This may include smoke, chemicals, and fumes.  Stay active, but remember to rest as well.  Learn and use tips on how to relax.  Make sure you get enough sleep. Most adults need at least 7 hours of sleep every night.  Eat healthy foods. Eat smaller meals more often. Rest before meals. Controlled breathing Learn and use tips on how to control your breathing as told by your doctor. Try:  Breathing in (inhaling) through your nose for 1 second. Then, pucker your lips and breath out (exhale) through your lips for 2 seconds.  Putting  one hand on your belly (abdomen). Breathe in slowly through your nose for 1 second. Your hand on your belly should move out. Pucker your lips and breathe out slowly through your lips. Your hand on your belly should move in as you breathe out.  Controlled coughing Learn and use controlled coughing to clear mucus from your lungs. Follow these steps: 1. Lean your head a little forward. 2. Breathe in deeply. 3. Try to hold your breath for 3 seconds. 4. Keep your mouth slightly open while coughing 2 times. 5. Spit any mucus out into a tissue. 6. Rest and do the steps again 1 or 2 times as needed. General instructions  Make sure you get all the shots (vaccines) that your doctor recommends. Ask your doctor about a flu shot and a pneumonia shot.  Use oxygen therapy and pulmonary rehabilitation if told by your doctor. If you need home oxygen therapy, ask your doctor if you should buy a tool to measure your oxygen level (oximeter).  Make a COPD action plan with your doctor. This helps you to know what to do if you feel worse than usual.  Manage any other conditions you have as told by your doctor.  Avoid going outside when it is very hot, cold, or humid.  Avoid people who have a sickness you can catch (contagious).  Keep all follow-up visits as told by your doctor. This is important. Contact a doctor if:  You cough up more mucus than usual.  There is a change in the color or thickness of the mucus.  It is harder to breathe than usual.  Your breathing is faster than usual.  You have trouble sleeping.  You need to use your medicines more often than usual.  You have trouble doing your normal activities such as getting dressed or walking around the house. Get help right away if:  You have shortness of breath while resting.  You have shortness of breath that stops you from: ? Being able to talk. ? Doing normal activities.  Your chest hurts for longer than 5 minutes.  Your skin  color is more blue than usual.  Your pulse oximeter shows that you have low oxygen for longer than 5 minutes.  You have a fever.  You feel too tired to breathe normally. Summary  Chronic obstructive pulmonary disease (COPD) is a long-term lung problem.  The way your lungs work will never return to normal. Usually the condition gets worse over time. There are things you can do to keep yourself as healthy as possible.  Take over-the-counter and prescription medicines only as told by your doctor.  If you smoke, stop. Smoking makes the problem worse. This information is not intended to replace advice given to you by your health care provider. Make sure you discuss any questions you have with your health care provider. Document Released: 10/19/2007 Document Revised: 04/14/2017 Document Reviewed: 06/06/2016 Elsevier Patient Education  2020 [employer].

## 2019-04-01 NOTE — Progress Notes (Signed)
SUBJECTIVE Chief Complaint  Patient presents with  . Shortness of Breath    SOB and pain in the left lung x1 month.  . Cough    HPI: Dominique Taylor is a 43 y.o. female present for follow up on continue cough/ bronchitis like sx and wheezing. She was seen 6 weeks ago and started on flovent 2 puffs BID. She reports she has seen some improvement in her wheezing. She is still having coughing fits in the morning and occasionally in the evening. She is taking her antihistamine. She has cut down to 1/2 pack of cigarettes and reports she usually only smokes about 1/2 cigarette when she does smoke. She is using the nicotine patches. She is pleased with her current level of accomplishment and hopes to be smoke free in a few weeks.   Prior note: She was treated with steroid pack and doxycyline 6 weeks ago. She tested negative for covid at [facility]. She reports improvement in her symptoms when using steroid, then symptoms returned. She is an everyday heavy smoker. She smokes at least 1 pack per day. She would like to try quit again by using patches.   Prior note:  cough.  Patient reports she has had a one-month history of a nasal congestion, cough and wheeze.  She went to a urgent care approximately 1 month ago and was prescribed prednisone and a albuterol inhaler.  At that time she tested positive for rhinovirus and negative for novel coronavirus.  She was not provided with an antibiotic.  She states since that time she continues to have cough that is nonproductive and a nasal congestion.  She feels that her wheezing has progressed.  She denies any shortness of breath.  depression and anxiety: Dominique Taylor a89 y.o.female present for follow up on her depression and anxiety. She reports she is feeling much improbed and reports compliance  with lexapro 20, Effexor 225, Abilify 5 mg QD and ativan 0.5 TID PRN. She is requesting refills today.   ROS: See pertinent positives and negatives per HPI.   Depression screen [facility] 2/9 09/14/2018 06/13/2018 01/10/2018 02/15/2017 02/01/2017  Decreased Interest 3 2 - 0 0  Down, Depressed, Hopeless 1 3 1  0 0  PHQ - 2 Score 4 5 1  0 0  Altered sleeping 1 2 3  - 0  Tired, decreased energy 1 2 3  - 0  Change in appetite 0 3 2 - 0  Feeling bad or failure about yourself  1 2 0 - 0  Trouble concentrating 1 3 2  - 0  Moving slowly or fidgety/restless 0 0 0 - 0  Suicidal thoughts 0 0 0 - 0  PHQ-9 Score 8 17 11  - 0  Difficult doing work/chores Somewhat difficult Somewhat difficult - - Not difficult at all   GAD 7 : Generalized Anxiety Score 09/14/2018 06/13/2018 01/10/2018 04/28/2017  Nervous, Anxious, on Edge 1 2 3 1   Control/stop worrying 3 3 3 3   Worry too much - different things 3 3 3 3   Trouble relaxing 1 3 3 3   Restless 1 2 1  0  Easily annoyed or irritable 3 3 3  0  Afraid - awful might happen 1 3 3  0  Total GAD 7 Score 13 19 19 10   Anxiety Difficulty Somewhat difficult Somewhat difficult - Not difficult at all      Patient Active Problem List   Diagnosis Date Noted  . Mild persistent asthma 02/18/2019  . Encounter for smoking cessation counseling 02/18/2019  .  CIN III (cervical intraepithelial neoplasia grade III) with severe dysplasia 04/21/2017  . Long-term current use of tamoxifen 01/09/2017  . Night sweats 01/09/2017  . Atypical lobular hyperplasia (ALH) of left breast 07/29/2016  . Depression with anxiety 02/29/2016  . Right carotid bruit 02/24/2015  . Tobacco use disorder 07/24/2014    Social History   Tobacco Use  . Smoking status: Current Every Day Smoker    Packs/day: 1.00    Years: 23.00    Pack years: 23.00    Types: Cigarettes  . Smokeless tobacco: Never Used  Substance Use Topics  . Alcohol use: No    Current Outpatient Medications:  .  albuterol (VENTOLIN HFA) 108 (90 Base) MCG/ACT inhaler, Inhale 2 puffs into the lungs every 6 (six) hours as needed for wheezing or shortness of breath., Disp: 18 g, Rfl: 1 .  ARIPiprazole  (ABILIFY) 5 MG tablet, Take 1 tablet (5 mg total) by mouth daily., Disp: 90 tablet, Rfl: 1 .  cetirizine (ZYRTEC) 10 MG tablet, Take 1 tablet (10 mg total) by mouth daily., Disp: 90 tablet, Rfl: 3 .  escitalopram (LEXAPRO) 20 MG tablet, Take 1 tablet (20 mg total) by mouth daily., Disp: 90 tablet, Rfl: 1 .  fluticasone (FLOVENT HFA) 44 MCG/ACT inhaler, Inhale 1-2 puffs into the lungs 2 (two) times daily., Disp: 1 Inhaler, Rfl: 12 .  LORazepam (ATIVAN) 0.5 MG tablet, Take 1 tablet (0.5 mg total) by mouth every 8 (eight) hours as needed for anxiety., Disp: 90 tablet, Rfl: 5 .  nicotine (NICODERM CQ - DOSED IN MG/24 HOURS) 14 mg/24hr patch, Place 1 patch (14 mg total) onto the skin daily., Disp: 28 patch, Rfl: 0 .  nicotine (NICODERM CQ - DOSED IN MG/24 HR) 7 mg/24hr patch, Place 1 patch (7 mg total) onto the skin daily., Disp: 28 patch, Rfl: 0 .  nicotine (NICODERM CQ) 21 mg/24hr patch, Place 1 patch (21 mg total) onto the skin daily., Disp: 14 patch, Rfl: 0 .  Prenatal Vit-Fe Fumarate-FA (PRENATAL MULTIVITAMIN) TABS tablet, Take 1 tablet by mouth daily at 12 noon., Disp: , Rfl:  .  tamoxifen (NOLVADEX) 20 MG tablet, TAKE 1 TABLET BY MOUTH ONCE DAILY AT BEDTIME, Disp: 30 tablet, Rfl: 11 .  venlafaxine XR (EFFEXOR-XR) 75 MG 24 hr capsule, Take 3 capsules (225 mg total) by mouth daily with breakfast., Disp: 270 capsule, Rfl: 1  Allergies  Allergen Reactions  . Wellbutrin [Bupropion] Itching    OBJECTIVE: BP 131/60 (BP Location: Right Arm, Patient Position: Sitting, Cuff Size: Normal)   Pulse 93   Temp 98.7 F (37.1 C) (Temporal)   Resp 17   Ht 5\' 4"  (1.626 m)   Wt 118 lb 4 oz (53.6 kg)   LMP 05/22/2017 (Exact Date)   SpO2 95%   BMI 20.30 kg/m  Gen: Afebrile. No acute distress. Nontoxic in presentation, very pleasant female.  HENT: AT. [place].  Eyes:Pupils Equal Round Reactive to light, Extraocular movements intact,  Conjunctiva without redness, discharge or icterus. CV: RRR, no edema, +2/4 P  posterior tibialis pulses Chest: no wheezing present. Rhonchi present diffusely.  Neuro:  Normal gait. PERLA. EOMi. Alert. Oriented x3  Psych: Normal affect, dress and demeanor. Normal speech. Normal thought content and judgment.  ASSESSMENT AND PLAN: Dominique Taylor is a 43 y.o. female present for  Asthma/smoking cessation - improved, but not controlled.  - continue  flonase, mucinex (DM if cough). - increase flovent to 4 puffs BID.  - albuterol only if needed- Goal <  2 x weekly.  - flu shot provided today. There was a flu shot in the system for 02/08/2019> however pts states she never received a shot and she was not seen in the [facility] on that day by documents.  - stop smoking- continue with patches. She has cut back a great deal.  - f/u 6 mos> sooner if still needing albuterol > 2x week after increase in Flovent and smoking cessation.   Depression with anxiety - stable. [facility] database reviewed and appropriate 04/01/19 - venlafaxine XR (EFFEXOR-XR) 75 MG 24 hr capsule; Take 3 capsules (225 mg total) by mouth daily with breakfast.  Dispense: 270 capsule; Refill: 1 - escitalopram (LEXAPRO) 20 MG tablet; Take 1 tablet (20 mg total) by mouth daily.  Dispense: 90 tablet; Refill: 1 - LORazepam (ATIVAN) 0.5 MG tablet; Take 1 tablet (0.5 mg total) by mouth every 8 (eight) hours as needed for anxiety.  Dispense: 90 tablet; Refill: 5 - f/u 6 mos- must be face to face encounter.    Flu shot administered today.   > 25 minutes spent with patient, >50% of time spent face to face     Howard Pouch, DO 04/01/2019

## 2019-07-15 ENCOUNTER — Ambulatory Visit: Payer: Medicaid Other | Admitting: Family Medicine

## 2019-11-10 ENCOUNTER — Other Ambulatory Visit: Payer: Self-pay | Admitting: Family Medicine

## 2019-11-10 DIAGNOSIS — F418 Other specified anxiety disorders: Secondary | ICD-10-CM

## 2019-11-11 ENCOUNTER — Encounter: Payer: Self-pay | Admitting: Family Medicine

## 2019-11-29 ENCOUNTER — Other Ambulatory Visit: Payer: Self-pay

## 2019-11-29 ENCOUNTER — Encounter: Payer: Self-pay | Admitting: Family Medicine

## 2019-11-29 ENCOUNTER — Ambulatory Visit (INDEPENDENT_AMBULATORY_CARE_PROVIDER_SITE_OTHER): Payer: Medicaid Other | Admitting: Family Medicine

## 2019-11-29 VITALS — BP 111/76 | HR 90 | Temp 98.7°F | Resp 18 | Ht 64.0 in | Wt 119.5 lb

## 2019-11-29 DIAGNOSIS — J4530 Mild persistent asthma, uncomplicated: Secondary | ICD-10-CM | POA: Diagnosis not present

## 2019-11-29 DIAGNOSIS — F418 Other specified anxiety disorders: Secondary | ICD-10-CM | POA: Diagnosis not present

## 2019-11-29 MED ORDER — QUETIAPINE FUMARATE 25 MG PO TABS: 25.0000 mg | ORAL_TABLET | Freq: Every day | ORAL | 1 refills | Status: DC

## 2019-11-29 MED ORDER — LORAZEPAM 0.5 MG PO TABS: 0.5000 mg | ORAL_TABLET | Freq: Two times a day (BID) | ORAL | 5 refills | Status: DC | PRN

## 2019-11-29 MED ORDER — ARIPIPRAZOLE 10 MG PO TABS: 10.0000 mg | ORAL_TABLET | Freq: Every day | ORAL | 1 refills | Status: DC

## 2019-11-29 MED ORDER — VENLAFAXINE HCL ER 75 MG PO CP24: 225.0000 mg | ORAL_CAPSULE | Freq: Every day | ORAL | 1 refills | Status: DC

## 2019-11-29 NOTE — Patient Instructions (Addendum)
DC- Lexapro Start Seroquel about 1 hour before bed. Start 1 tab (25 mg)  for 1 week, then if needed only increase to 2 tabs Do not take a night time dose of ativan any longer.  Effexor dose same (225 mg Qd) Increased Abilify to 10 mg a day.   Follow up in 3 months if needing improvement in control. If doing well 5.5 month follow up.

## 2019-11-29 NOTE — Progress Notes (Signed)
SUBJECTIVE Chief Complaint  Patient presents with  . Depression  . Anxiety  . Allergies    HPI: Dominique Taylor is a 44 y.o. female present for follow up on  depression and anxiety: Patient presents today for follow-up on depression anxiety she feels she could use some increased coverage.  She has increased anxiety and is not sleeping well.  She wishes she did not have to take as many pills per day. She reports she is feeling much improbed and reports compliance  with lexapro 20, Effexor 225, Abilify 5 mg QD and ativan 0.5 TID PRN.   ROS: See pertinent positives and negatives per HPI.  Depression screen Dominique Taylor 2/9 11/29/2019 09/14/2018 06/13/2018 01/10/2018 02/15/2017  Decreased Interest 3 3 2  - 0  Down, Depressed, Hopeless 0 1 3 1  0  PHQ - 2 Score 3 4 5 1  0  Altered sleeping 3 1 2 3  -  Tired, decreased energy 3 1 2 3  -  Change in appetite 3 0 3 2 -  Feeling bad or failure about yourself  1 1 2  0 -  Trouble concentrating 3 1 3 2  -  Moving slowly or fidgety/restless 0 0 0 0 -  Suicidal thoughts 1 0 0 0 -  PHQ-9 Score 17 8 17 11  -  Difficult doing work/chores Extremely dIfficult Somewhat difficult Somewhat difficult - -   GAD 7 : Generalized Anxiety Score 11/29/2019 09/14/2018 06/13/2018 01/10/2018  Nervous, Anxious, on Edge 3 1 2 3   Control/stop worrying 3 3 3 3   Worry too much - different things 3 3 3 3   Trouble relaxing 3 1 3 3   Restless 3 1 2 1   Easily annoyed or irritable 3 3 3 3   Afraid - awful might happen 1 1 3 3   Total GAD 7 Score 19 13 19 19   Anxiety Difficulty Extremely difficult Somewhat difficult Somewhat difficult -    Patient Active Problem List   Diagnosis Date Noted  . Mild persistent asthma 02/18/2019  . Encounter for smoking cessation counseling 02/18/2019  . CIN III (cervical intraepithelial neoplasia grade III) with severe dysplasia 04/21/2017  . Long-term current use of tamoxifen 01/09/2017  . Night sweats 01/09/2017  . Atypical lobular hyperplasia  (ALH) of left breast 07/29/2016  . Depression with anxiety 02/29/2016  . Right carotid bruit 02/24/2015  . Tobacco use disorder 07/24/2014    Social History   Tobacco Use  . Smoking status: Current Every Day Smoker    Packs/day: 1.00    Years: 23.00    Pack years: 23.00    Types: Cigarettes  . Smokeless tobacco: Never Used  Substance Use Topics  . Alcohol use: No    Current Outpatient Medications:  .  albuterol (VENTOLIN HFA) 108 (90 Base) MCG/ACT inhaler, Inhale 2 puffs into the lungs every 6 (six) hours as needed for wheezing or shortness of breath., Disp: 18 g, Rfl: 1 .  ARIPiprazole (ABILIFY) 10 MG tablet, Take 1 tablet (10 mg total) by mouth daily., Disp: 90 tablet, Rfl: 1 .  cetirizine (ZYRTEC) 10 MG tablet, Take 1 tablet (10 mg total) by mouth daily., Disp: 90 tablet, Rfl: 3 .  fluticasone (FLOVENT HFA) 44 MCG/ACT inhaler, 4 puffs BID, Disp: 1 Inhaler, Rfl: 12 .  LORazepam (ATIVAN) 0.5 MG tablet, Take 1 tablet (0.5 mg total) by mouth 2 (two) times daily as needed for anxiety., Disp: 60 tablet, Rfl: 5 .  Prenatal Vit-Fe Fumarate-FA (PRENATAL MULTIVITAMIN) TABS tablet, Take 1 tablet by mouth  daily at 12 noon., Disp: , Rfl:  .  tamoxifen (NOLVADEX) 20 MG tablet, TAKE 1 TABLET BY MOUTH ONCE DAILY AT BEDTIME, Disp: 30 tablet, Rfl: 11 .  venlafaxine XR (EFFEXOR-XR) 75 MG 24 hr capsule, Take 3 capsules (225 mg total) by mouth daily with breakfast., Disp: 270 capsule, Rfl: 1 .  QUEtiapine (SEROQUEL) 25 MG tablet, Take 1-2 tablets (25-50 mg total) by mouth at bedtime., Disp: 180 tablet, Rfl: 1  Allergies  Allergen Reactions  . Wellbutrin [Bupropion] Itching    OBJECTIVE: BP 111/76 (BP Location: Left Arm, Patient Position: Sitting, Cuff Size: Normal)   Pulse 90   Temp 98.7 F (37.1 C) (Temporal)   Resp 18   Ht 5\' 4"  (1.626 m)   Wt 119 lb 8 oz (54.2 kg)   LMP 05/22/2017 (Exact Date)   SpO2 94%   BMI 20.51 kg/m  Gen: Afebrile. No acute distress.  HENT: AT. [place]. Eyes:Pupils  Equal Round Reactive to light, Extraocular movements intact,  Conjunctiva without redness, discharge or icterus. CV: RRR  Chest: CTAB, no wheeze or crackles Neuro: Alert. Oriented.  Psych: Normal affect, dress and demeanor. Normal speech. Normal thought content and judgment.   ASSESSMENT AND PLAN: Dominique Taylor is a 44 y.o. female present for  Asthma/smoking cessation -Patient desires referral to allergy/asthma.  She is still having some cough. - continue  flonase -  flovent to 4 puffs BID.  - albuterol only if needed- Goal < 2 x weekly.    Depression with anxiety -Patient desires increased coverage for her anxiety. Discontinue Lexapro Continue Effexor to 25 mg daily Increase Abilify 5 mg to 10 mg daily Start Seroquel taper.  Patient was provided with instructions. May continue Ativan twice daily only, patient aware not to take night dose of Ativan since starting Seroquel.  Refills provided -  NCCS database reviewed and appropriate 12/02/19 - f/u 6 mos- must be face to face encounter, sooner if needed   Orders Placed This Encounter  Procedures  . Ambulatory referral to Allergy   Meds ordered this encounter  Medications  . venlafaxine XR (EFFEXOR-XR) 75 MG 24 hr capsule    Sig: Take 3 capsules (225 mg total) by mouth daily with breakfast.    Dispense:  270 capsule    Refill:  1  . ARIPiprazole (ABILIFY) 10 MG tablet    Sig: Take 1 tablet (10 mg total) by mouth daily.    Dispense:  90 tablet    Refill:  1  . QUEtiapine (SEROQUEL) 25 MG tablet    Sig: Take 1-2 tablets (25-50 mg total) by mouth at bedtime.    Dispense:  180 tablet    Refill:  1  . LORazepam (ATIVAN) 0.5 MG tablet    Sig: Take 1 tablet (0.5 mg total) by mouth 2 (two) times daily as needed for anxiety.    Dispense:  60 tablet    Refill:  5    Referral Orders     Ambulatory referral to [place], DO 12/02/2019

## 2019-12-02 NOTE — Progress Notes (Deleted)
New Patient Note  RE: Dominique Taylor MRN: [id number] DOB: 1976/05/14 Date of Office Visit: 12/03/2019  Referring provider: Ma Hillock, DO Primary care provider: Ma Hillock, DO  Chief Complaint: No chief complaint on file.  History of Present Illness: I had the pleasure of seeing Dominique Taylor for initial evaluation at the [facility] on 12/02/2019. She is a 44 y.o. female, who is referred here by Howard Pouch A, DO for the evaluation of asthma.  She reports symptoms of *** chest tightness, shortness of breath, coughing, wheezing, nocturnal awakenings for *** years. Current medications include *** which help. She reports *** using aerochamber with inhalers. She tried the following inhalers: ***. Main triggers are ***allergies, infections, weather changes, smoke, exercise, pet exposure. In the last month, frequency of symptoms: ***x/week. Frequency of nocturnal symptoms: ***x/month. Frequency of SABA use: ***x/week. Interference with physical activity: ***. Sleep is ***disturbed. In the last 12 months, emergency room visits/urgent care visits/doctor office visits or hospitalizations due to respiratory issues: ***. In the last 12 months, oral steroids courses: ***. Lifetime history of hospitalization for respiratory issues: ***. Prior intubations: ***. Asthma was diagnosed at age *** by ***. History of pneumonia: ***. She was evaluated by allergist ***pulmonologist in the past. Smoking exposure: ***. Up to date with flu vaccine: ***. Up to date with pneumonia vaccine: ***. Up to date with COVID-19 vaccine: ***.  History of reflux: ***.  Assessment and Plan: Dominique Taylor is a 44 y.o. female with: No problem-specific Assessment & Plan notes found for this encounter.  No follow-ups on file.  No orders of the defined types were placed in this encounter.  Lab Orders  No laboratory test(s) ordered today    Other allergy screening: Asthma: {Blank  single:19197::"yes","no"} Rhino conjunctivitis: {Blank single:19197::"yes","no"} Food allergy: {Blank single:19197::"yes","no"} Medication allergy: {Blank single:19197::"yes","no"} Hymenoptera allergy: {Blank single:19197::"yes","no"} Urticaria: {Blank single:19197::"yes","no"} Eczema:{Blank single:19197::"yes","no"} History of recurrent infections suggestive of immunodeficency: {Blank single:19197::"yes","no"}  Diagnostics: Spirometry:  Tracings reviewed. Her effort: {Blank single:19197::"Good reproducible efforts.","It was hard to get consistent efforts and there is a question as to whether this reflects a maximal maneuver.","Poor effort, data can not be interpreted."} FVC: ***L FEV1: ***L, ***% predicted FEV1/FVC ratio: ***% Interpretation: {Blank single:19197::"Spirometry consistent with mild obstructive disease","Spirometry consistent with moderate obstructive disease","Spirometry consistent with severe obstructive disease","Spirometry consistent with possible restrictive disease","Spirometry consistent with mixed obstructive and restrictive disease","Spirometry uninterpretable due to technique","Spirometry consistent with normal pattern","No overt abnormalities noted given today's efforts"}.  Please see scanned spirometry results for details.  Skin Testing: {Blank single:19197::"Select foods","Environmental allergy panel","Environmental allergy panel and select foods","Food allergy panel","None","Deferred due to recent antihistamines use"}. Positive test to: ***. Negative test to: ***.  Results discussed with patient/family.   Past Medical History: Patient Active Problem List   Diagnosis Date Noted  . Mild persistent asthma 02/18/2019  . Encounter for smoking cessation counseling 02/18/2019  . CIN III (cervical intraepithelial neoplasia grade III) with severe dysplasia 04/21/2017  . Long-term current use of tamoxifen 01/09/2017  . Night sweats 01/09/2017  . Atypical lobular  hyperplasia (ALH) of left breast 07/29/2016  . Depression with anxiety 02/29/2016  . Right carotid bruit 02/24/2015  . Tobacco use disorder 07/24/2014   Past Medical History:  Diagnosis Date  . Abnormal Pap smear   . Breast mass, left   . Cancer [facility])    breast cancer  . Depression   . HPV (human papilloma virus) infection    Past Surgical History: Past Surgical History:  Procedure Laterality Date  . ABDOMINAL HYSTERECTOMY    . AUGMENTATION MAMMAPLASTY    . BREAST ENHANCEMENT SURGERY    . BREAST EXCISIONAL BIOPSY    . BREAST SURGERY     left breast lumpectomy  . COLPOSCOPY    . RADIOACTIVE SEED GUIDED EXCISIONAL BREAST BIOPSY Left 06/21/2016   Procedure: LEFT BREAST RADIOACTIVE SEED GUIDED EXCISIONAL BIOPSY;  Surgeon: Rolm Bookbinder, MD;  Location: [place];  Service: General;  Laterality: Left;  . TUBAL LIGATION    . VAGINAL HYSTERECTOMY N/A 05/30/2017   Procedure: HYSTERECTOMY VAGINAL WITH BILATERAL PARTIAL SALPINGECTOMY;  Surgeon: Jonnie Kind, MD;  Location: [facility];  Service: Gynecology;  Laterality: N/A;   Medication List:  Current Outpatient Medications  Medication Sig Dispense Refill  . albuterol (VENTOLIN HFA) 108 (90 Base) MCG/ACT inhaler Inhale 2 puffs into the lungs every 6 (six) hours as needed for wheezing or shortness of breath. 18 g 1  . ARIPiprazole (ABILIFY) 10 MG tablet Take 1 tablet (10 mg total) by mouth daily. 90 tablet 1  . cetirizine (ZYRTEC) 10 MG tablet Take 1 tablet (10 mg total) by mouth daily. 90 tablet 3  . fluticasone (FLOVENT HFA) 44 MCG/ACT inhaler 4 puffs BID 1 Inhaler 12  . LORazepam (ATIVAN) 0.5 MG tablet Take 1 tablet (0.5 mg total) by mouth 2 (two) times daily as needed for anxiety. 60 tablet 5  . Prenatal Vit-Fe Fumarate-FA (PRENATAL MULTIVITAMIN) TABS tablet Take 1 tablet by mouth daily at 12 noon.    . QUEtiapine (SEROQUEL) 25 MG tablet Take 1-2 tablets (25-50 mg total) by mouth at bedtime. 180 tablet 1  . tamoxifen  (NOLVADEX) 20 MG tablet TAKE 1 TABLET BY MOUTH ONCE DAILY AT BEDTIME 30 tablet 11  . venlafaxine XR (EFFEXOR-XR) 75 MG 24 hr capsule Take 3 capsules (225 mg total) by mouth daily with breakfast. 270 capsule 1   No current facility-administered medications for this visit.   Allergies: Allergies  Allergen Reactions  . Wellbutrin [Bupropion] Itching   Social History: Social History   Socioeconomic History  . Marital status: Married    Spouse name: Not on file  . Number of children: 2  . Years of education: Not on file  . Highest education level: Not on file  Occupational History  . Not on file  Tobacco Use  . Smoking status: Current Every Day Smoker    Packs/day: 1.00    Years: 23.00    Pack years: 23.00    Types: Cigarettes  . Smokeless tobacco: Never Used  Vaping Use  . Vaping Use: Never used  Substance and Sexual Activity  . Alcohol use: No  . Drug use: No  . Sexual activity: Not Currently    Birth control/protection: Surgical  Other Topics Concern  . Not on file  Social History Narrative   Ms Baik lives w/her husband & 2 grown children. She works Medical laboratory scientific officer.   Social Determinants of Health   Financial Resource Strain:   . Difficulty of Paying Living Expenses:   Food Insecurity:   . Worried About Charity fundraiser in the Last Year:   . Arboriculturist in the Last Year:   Transportation Needs:   . Film/video editor (Medical):   Marland Kitchen Lack of Transportation (Non-Medical):   Physical Activity:   . Days of Exercise per Week:   . Minutes of Exercise per Session:   Stress:   . Feeling of Stress :   Social Connections:   .  Frequency of Communication with Friends and Family:   . Frequency of Social Gatherings with Friends and Family:   . Attends Religious Services:   . Active Member of Clubs or Organizations:   . Attends Archivist Meetings:   Marland Kitchen Marital Status:    Lives in a ***. Smoking: *** Occupation: ***  Environmental HistoryJournalist, newspaper in the house: Estate agent in the family room: {Blank single:19197::"yes","no"} Carpet in the bedroom: {Blank single:19197::"yes","no"} Heating: {Blank single:19197::"electric","gas"} Cooling: {Blank single:19197::"central","window"} Pet: {Blank single:19197::"yes ***","no"}  Family History: Family History  Problem Relation Age of Onset  . Arthritis Mother        RA  . Cancer Mother 17       cervical cancer   . Depression Father   . Hypertension Father   . Diabetes Father   . Ulcerative colitis Father   . Hypertension Brother   . Breast cancer Maternal Aunt    Problem                               Relation Asthma                                   *** Eczema                                *** Food allergy                          *** Allergic rhino conjunctivitis     ***  Review of Systems  Constitutional: Negative for appetite change, chills, fever and unexpected weight change.  HENT: Negative for congestion and rhinorrhea.   Eyes: Negative for itching.  Respiratory: Negative for cough, chest tightness, shortness of breath and wheezing.   Cardiovascular: Negative for chest pain.  Gastrointestinal: Negative for abdominal pain.  Genitourinary: Negative for difficulty urinating.  Skin: Negative for rash.  Neurological: Negative for headaches.   Objective: LMP 05/22/2017 (Exact Date)  There is no height or weight on file to calculate BMI. Physical Exam Vitals and nursing note reviewed.  Constitutional:      Appearance: Normal appearance. She is well-developed.  HENT:     Head: Normocephalic and atraumatic.     Right Ear: External ear normal.     Left Ear: External ear normal.     Nose: Nose normal.     Mouth/Throat:     Mouth: Mucous membranes are moist.     Pharynx: Oropharynx is clear.  Eyes:     Conjunctiva/sclera: Conjunctivae normal.  Cardiovascular:     Rate and Rhythm: Normal rate and regular rhythm.     Heart  sounds: Normal heart sounds. No murmur heard.  No friction rub. No gallop.   Pulmonary:     Effort: Pulmonary effort is normal.     Breath sounds: Normal breath sounds. No wheezing, rhonchi or rales.  Abdominal:     Palpations: Abdomen is soft.  Musculoskeletal:     Cervical back: Neck supple.  Skin:    General: Skin is warm.     Findings: No rash.  Neurological:     Mental Status: She is alert and oriented to person, place, and time.  Psychiatric:        Behavior: Behavior normal.  The plan was reviewed with the patient/family, and all questions/concerned were addressed.  It was my pleasure to see Dominique Taylor today and participate in her care. Please feel free to contact me with any questions or concerns.  Sincerely,  Rexene Alberts, DO Allergy & Immunology  [facility] office: [phone] [facility] office: [place] office: [phone]

## 2019-12-03 ENCOUNTER — Ambulatory Visit: Payer: Medicaid Other | Admitting: Allergy

## 2020-02-05 NOTE — Progress Notes (Signed)
[place]   Telephone:([phone] Fax:([phone]) [phone]   Clinic Follow up Note   Patient Care Team: Ma Hillock, DO as PCP - General (Family Medicine) Rolm Bookbinder, MD as Consulting Physician (General Surgery) 02/06/2020  CHIEF COMPLAINT: Follow-up ALH of left breast  CURRENT THERAPY: Chemoprevention with tamoxifen once daily started 08/01/2016  INTERVAL HISTORY: Ms. Dominique Taylor returns for annual follow-up as scheduled.  She was last seen on 02/07/2019.  She has a routine mammogram scheduled on 02/07/2020.  She denies changes in her health since last visit.  She continues routine care with PCP and other providers.  She continues tamoxifen, no obvious side effects.  Denies bone or joint pain, hot flash, bleeding, signs of thrombosis.  Denies change in her bowel habits.  No recent fever, chills, cough, chest pain, dyspnea or other new concerns.  Mental health is stable.   MEDICAL HISTORY:  Past Medical History:  Diagnosis Date  . Abnormal Pap smear   . Breast mass, left   . Cancer [facility])    breast cancer  . Depression   . HPV (human papilloma virus) infection     SURGICAL HISTORY: Past Surgical History:  Procedure Laterality Date  . ABDOMINAL HYSTERECTOMY    . AUGMENTATION MAMMAPLASTY    . BREAST ENHANCEMENT SURGERY    . BREAST EXCISIONAL BIOPSY    . BREAST SURGERY     left breast lumpectomy  . COLPOSCOPY    . RADIOACTIVE SEED GUIDED EXCISIONAL BREAST BIOPSY Left 06/21/2016   Procedure: LEFT BREAST RADIOACTIVE SEED GUIDED EXCISIONAL BIOPSY;  Surgeon: Rolm Bookbinder, MD;  Location: [place];  Service: General;  Laterality: Left;  . TUBAL LIGATION    . VAGINAL HYSTERECTOMY N/A 05/30/2017   Procedure: HYSTERECTOMY VAGINAL WITH BILATERAL PARTIAL SALPINGECTOMY;  Surgeon: Jonnie Kind, MD;  Location: [facility];  Service: Gynecology;  Laterality: N/A;    I have reviewed the social history and family history with the patient and they are  unchanged from previous note.  ALLERGIES:  is allergic to wellbutrin [bupropion].  MEDICATIONS:  Current Outpatient Medications  Medication Sig Dispense Refill  . albuterol (VENTOLIN HFA) 108 (90 Base) MCG/ACT inhaler Inhale 2 puffs into the lungs every 6 (six) hours as needed for wheezing or shortness of breath. 18 g 1  . ARIPiprazole (ABILIFY) 10 MG tablet Take 1 tablet (10 mg total) by mouth daily. 90 tablet 1  . cetirizine (ZYRTEC) 10 MG tablet Take 1 tablet (10 mg total) by mouth daily. 90 tablet 3  . fluticasone (FLOVENT HFA) 44 MCG/ACT inhaler 4 puffs BID 1 Inhaler 12  . LORazepam (ATIVAN) 0.5 MG tablet Take 1 tablet (0.5 mg total) by mouth 2 (two) times daily as needed for anxiety. 60 tablet 5  . Prenatal Vit-Fe Fumarate-FA (PRENATAL MULTIVITAMIN) TABS tablet Take 1 tablet by mouth daily at 12 noon.    . QUEtiapine (SEROQUEL) 25 MG tablet Take 1-2 tablets (25-50 mg total) by mouth at bedtime. 180 tablet 1  . tamoxifen (NOLVADEX) 20 MG tablet TAKE 1 TABLET BY MOUTH ONCE DAILY AT BEDTIME 30 tablet 11  . venlafaxine XR (EFFEXOR-XR) 75 MG 24 hr capsule Take 3 capsules (225 mg total) by mouth daily with breakfast. 270 capsule 1   No current facility-administered medications for this visit.    PHYSICAL EXAMINATION: ECOG PERFORMANCE STATUS: 0 - Asymptomatic  Vitals:   02/06/20 1121  BP: (!) 139/97  Pulse: 82  Resp: 18  Temp: (!) 97.1 F (36.2 C)  SpO2: 98%   Filed Weights   02/06/20 1121  Weight: 118 lb 12.8 oz (53.9 kg)    GENERAL:alert, no distress and comfortable SKIN: No rash to exposed skin EYES: sclera clear NECK: Without mass LYMPH:  no palpable cord supraclavicular lymphadenopathy  LUNGS: clear with normal breathing effort HEART: regular rate & rhythm, no lower extremity edema NEURO: alert & oriented x 3 with fluent speech, no focal motor/sensory deficits Breast exam: S/p left lumpectomy with bilateral reconstruction with implants.  No nipple discharge or  inversion.  No palpable mass in either breast or axilla that I could appreciate.  No nodularity along the chest wall.  Benign exam  LABORATORY DATA:  I have reviewed the data as listed CBC Latest Ref Rng & Units 02/06/2020 02/07/2019 01/10/2018  WBC 4.0 - 10.5 K/uL 6.9 7.4 7.9  Hemoglobin 12.0 - 15.0 g/dL 14.5 13.8 13.8  Hematocrit 36 - 46 % 42.3 41.3 40.2  Platelets 150 - 400 K/uL 224 239 301     CMP Latest Ref Rng & Units 02/06/2020 02/07/2019 01/10/2018  Glucose 70 - 99 mg/dL 89 69(L) 106(H)  BUN 6 - 20 mg/dL 13 18 18   Creatinine 0.44 - 1.00 mg/dL 0.70 0.67 0.65  Sodium 135 - 145 mmol/L 140 142 143  Potassium 3.5 - 5.1 mmol/L 3.2(L) 3.5 3.8  Chloride 98 - 111 mmol/L 104 107 107  CO2 22 - 32 mmol/L 31 27 28   Calcium 8.9 - 10.3 mg/dL 8.6(L) 8.7(L) 8.9  Total Protein 6.5 - 8.1 g/dL 6.8 6.7 6.9  Total Bilirubin 0.3 - 1.2 mg/dL 0.4 <0.2(L) <0.2(L)  Alkaline Phos 38 - 126 U/L 70 60 67  AST 15 - 41 U/L 20 20 18   ALT 0 - 44 U/L 18 17 11       RADIOGRAPHIC STUDIES: I have personally reviewed the radiological images as listed and agreed with the findings in the report. No results found.   ASSESSMENT & PLAN: Dominique Taylor is a 44 y.o. female with   1. left breast atypical lobular hyperplasia -She was found to have [place] in 05/2016. She underwent left lumpecomty on 06/21/16. There was no evidence of malignancy. -Natural history of atypical hyperplasia is consider a benign breast disease, however it does increase the risk of breast cancer by 3-5 fold. It is considered as a high risk for breast cancer.  -She is compliant with annual screening mammogram -Her breast density is grade C/D, she would benefit from additional screening MRI but has declined in the past. -she has been on adjuvant tamoxifen since 08/01/16 for chemoprevention. Tolerating very well. Goal is total 5 years  2. Bone health -She has never had a DEXA Scan -She takes a multivitamin -Tamoxifen can increased her bone  density.  3. Smoking cessation, sun exposure -She has history of heavy smoking, is trying to quit, but not sccessful -he has used tanning beds, we discussed the skin cancer risk and I strongly encouraged her to stop. And to use SPF in the sunlight. She agrees  4. Depression  -stable lately, on medication regimen   Disposition: Mr. Loth is clinically doing well.  She continues tamoxifen which she tolerates very well without any side effects.  Breast exam is benign.  The CBC is unchanged.  CMP reflects mild hypokalemia and hypocalcemia.  She takes a daily prenatal vitamin.  I recommend to add calcium and potassium in her diet.  Overall no clinical concern for recurrent or new breast cancer  She will proceed with  routine mammogram on 02/07/2020.  She will continue surveillance and tamoxifen.  We reviewed healthy lifestyle practices, cancer risk reduction, and to maintain routine follow-up with healthcare providers.  We discussed age-appropriate cancer screenings and the new age recommendation for colon cancer is 75.  She has 2 family members with ulcerative colitis.  She is interested in a referral to GI.   We will see her back in a year.  No problem-specific Assessment & Plan notes found for this encounter.   Orders Placed This Encounter  Procedures  . Ambulatory referral to Gastroenterology    Referral Priority:   Routine    Referral Type:   Consultation    Referral Reason:   Specialty Services Required    Number of Visits Requested:   1   All questions were answered. The patient knows to call the clinic with any problems, questions or concerns. No barriers to learning were detected.     Alla Feeling, NP 02/06/20

## 2020-02-06 ENCOUNTER — Inpatient Hospital Stay (HOSPITAL_BASED_OUTPATIENT_CLINIC_OR_DEPARTMENT_OTHER): Payer: Medicaid Other | Admitting: Nurse Practitioner

## 2020-02-06 ENCOUNTER — Other Ambulatory Visit: Payer: Self-pay

## 2020-02-06 ENCOUNTER — Inpatient Hospital Stay: Payer: Medicaid Other | Attending: Nurse Practitioner

## 2020-02-06 ENCOUNTER — Encounter: Payer: Self-pay | Admitting: Internal Medicine

## 2020-02-06 ENCOUNTER — Encounter: Payer: Self-pay | Admitting: Nurse Practitioner

## 2020-02-06 VITALS — BP 139/97 | HR 82 | Temp 97.1°F | Resp 18 | Ht 64.0 in | Wt 118.8 lb

## 2020-02-06 DIAGNOSIS — Z1211 Encounter for screening for malignant neoplasm of colon: Secondary | ICD-10-CM

## 2020-02-06 DIAGNOSIS — F17200 Nicotine dependence, unspecified, uncomplicated: Secondary | ICD-10-CM | POA: Insufficient documentation

## 2020-02-06 DIAGNOSIS — N6092 Unspecified benign mammary dysplasia of left breast: Secondary | ICD-10-CM | POA: Insufficient documentation

## 2020-02-06 DIAGNOSIS — E876 Hypokalemia: Secondary | ICD-10-CM | POA: Diagnosis not present

## 2020-02-06 DIAGNOSIS — F329 Major depressive disorder, single episode, unspecified: Secondary | ICD-10-CM | POA: Insufficient documentation

## 2020-02-06 LAB — COMPREHENSIVE METABOLIC PANEL
ALT: 18 U/L (ref 0–44)
AST: 20 U/L (ref 15–41)
Albumin: 3.5 g/dL (ref 3.5–5.0)
Alkaline Phosphatase: 70 U/L (ref 38–126)
Anion gap: 5 (ref 5–15)
BUN: 13 mg/dL (ref 6–20)
CO2: 31 mmol/L (ref 22–32)
Calcium: 8.6 mg/dL — ABNORMAL LOW (ref 8.9–10.3)
Chloride: 104 mmol/L (ref 98–111)
Creatinine, Ser: 0.7 mg/dL (ref 0.44–1.00)
GFR calc Af Amer: >60 mL/min (ref >60)
GFR calc non Af Amer: >60 mL/min (ref >60)
Glucose, Bld: 89 mg/dL (ref 70–99)
Potassium: 3.2 mmol/L — ABNORMAL LOW (ref 3.5–5.1)
Sodium: 140 mmol/L (ref 135–145)
Total Bilirubin: 0.4 mg/dL (ref 0.3–1.2)
Total Protein: 6.8 g/dL (ref 6.5–8.1)

## 2020-02-06 LAB — CBC WITH DIFFERENTIAL/PLATELET
Abs Immature Granulocytes: 0.02 10*3/uL (ref 0.00–0.07)
Basophils Absolute: 0 10*3/uL (ref 0.0–0.1)
Basophils Relative: 0 %
Eosinophils Absolute: 0.1 10*3/uL (ref 0.0–0.5)
Eosinophils Relative: 2 %
HCT: 42.3 % (ref 36.0–46.0)
Hemoglobin: 14.5 g/dL (ref 12.0–15.0)
Immature Granulocytes: 0 %
Lymphocytes Relative: 27 %
Lymphs Abs: 1.9 10*3/uL (ref 0.7–4.0)
MCH: 35 pg — ABNORMAL HIGH (ref 26.0–34.0)
MCHC: 34.3 g/dL (ref 30.0–36.0)
MCV: 102.2 fL — ABNORMAL HIGH (ref 80.0–100.0)
Monocytes Absolute: 0.4 10*3/uL (ref 0.1–1.0)
Monocytes Relative: 6 %
Neutro Abs: 4.5 10*3/uL (ref 1.7–7.7)
Neutrophils Relative %: 65 %
Platelets: 224 10*3/uL (ref 150–400)
RBC: 4.14 MIL/uL (ref 3.87–5.11)
RDW: 12.9 % (ref 11.5–15.5)
WBC: 6.9 10*3/uL (ref 4.0–10.5)
nRBC: 0 % (ref 0.0–0.2)

## 2020-02-07 ENCOUNTER — Ambulatory Visit
Admission: RE | Admit: 2020-02-07 | Discharge: 2020-02-07 | Disposition: A | Discharge status: Home / Self Care | Payer: Medicaid Other | Source: Ambulatory Visit | Attending: Hematology | Admitting: Hematology

## 2020-02-07 ENCOUNTER — Telehealth: Payer: Self-pay | Admitting: Nurse Practitioner

## 2020-02-07 DIAGNOSIS — N6092 Unspecified benign mammary dysplasia of left breast: Secondary | ICD-10-CM

## 2020-02-07 NOTE — Telephone Encounter (Signed)
Scheduled per 9/23 los. Mailing pt appt calendar.

## 2020-02-11 ENCOUNTER — Other Ambulatory Visit: Payer: Self-pay | Admitting: Hematology

## 2020-02-11 DIAGNOSIS — R928 Other abnormal and inconclusive findings on diagnostic imaging of breast: Secondary | ICD-10-CM

## 2020-02-25 ENCOUNTER — Other Ambulatory Visit: Payer: Self-pay

## 2020-02-25 ENCOUNTER — Other Ambulatory Visit: Payer: Self-pay | Admitting: Hematology

## 2020-02-25 ENCOUNTER — Ambulatory Visit
Admission: RE | Admit: 2020-02-25 | Discharge: 2020-02-25 | Disposition: A | Discharge status: Home / Self Care | Payer: Medicaid Other | Source: Ambulatory Visit | Attending: Hematology | Admitting: Hematology

## 2020-02-25 DIAGNOSIS — R928 Other abnormal and inconclusive findings on diagnostic imaging of breast: Secondary | ICD-10-CM

## 2020-03-07 ENCOUNTER — Other Ambulatory Visit: Payer: Self-pay | Admitting: Hematology

## 2020-03-07 DIAGNOSIS — N6092 Unspecified benign mammary dysplasia of left breast: Secondary | ICD-10-CM

## 2020-03-23 ENCOUNTER — Encounter: Payer: Self-pay | Admitting: *Deleted

## 2020-03-31 ENCOUNTER — Ambulatory Visit: Payer: Medicaid Other | Admitting: Internal Medicine

## 2020-05-20 ENCOUNTER — Telehealth: Payer: Self-pay

## 2020-05-20 ENCOUNTER — Other Ambulatory Visit: Payer: Self-pay | Admitting: Family Medicine

## 2020-05-20 NOTE — Telephone Encounter (Signed)
LVM for pt to call for scheduling.

## 2020-05-20 NOTE — Telephone Encounter (Signed)
Pt needs to schedule appt    I have received a refill request for Abilify and seroquel for Dominique Taylor  Patient is overdue for their 18mos follow up for their chronic conditions management. Please schedule patient for [facility]  Follow-up  ASAP  in order to avoid lapse in scripts.   May extend a 30 day script of requested med IF patient scheduled follow up only. Please advise patient if they cancel or have to change appt we will not be able to extend script further without them being seen.   Thanks.      Appt can be VIDEO if patient desires,  only if they have  mychart, computer or smart phone that is capable of virtual visit. Instructions- They must make  sure at least the DAY BEFORE appt to: 1. Sign up for mychart if not done so already.  2. Install all updates to phone if planning on using smart phone.  3. Turn off any security features that block camera & microphone on phone and computer> Access to microphone and camera is a must.  4. Make sure sound is turned up on device being used.

## 2020-05-20 NOTE — Telephone Encounter (Signed)
I have received a refill request for Abilify and seroquel for Dominique Taylor  Patient is overdue for their 63mos follow up for their chronic conditions management. Please schedule patient for [facility]  Follow-up  ASAP  in order to avoid lapse in scripts.   May extend a 30 day script of requested med IF patient scheduled follow up only. Please advise patient if they cancel or have to change appt we will not be able to extend script further without them being seen.   Thanks.      Appt can be VIDEO if patient desires,  only if they have  mychart, computer or smart phone that is capable of virtual visit. Instructions- They must make  sure at least the DAY BEFORE appt to: 1. Sign up for mychart if not done so already.  2. Install all updates to phone if planning on using smart phone.  3. Turn off any security features that block camera & microphone on phone and computer> Access to microphone and camera is a must.  4. Make sure sound is turned up on device being used.

## 2020-05-26 ENCOUNTER — Ambulatory Visit: Payer: Medicaid Other | Admitting: Family Medicine

## 2020-05-29 ENCOUNTER — Ambulatory Visit (INDEPENDENT_AMBULATORY_CARE_PROVIDER_SITE_OTHER): Payer: Medicaid Other | Admitting: Family Medicine

## 2020-05-29 ENCOUNTER — Other Ambulatory Visit: Payer: Self-pay

## 2020-05-29 ENCOUNTER — Encounter: Payer: Self-pay | Admitting: Family Medicine

## 2020-05-29 VITALS — BP 100/67 | HR 80 | Temp 98.8°F | Ht 63.5 in | Wt 112.0 lb

## 2020-05-29 DIAGNOSIS — Z23 Encounter for immunization: Secondary | ICD-10-CM

## 2020-05-29 DIAGNOSIS — R61 Generalized hyperhidrosis: Secondary | ICD-10-CM

## 2020-05-29 DIAGNOSIS — J4530 Mild persistent asthma, uncomplicated: Secondary | ICD-10-CM | POA: Diagnosis not present

## 2020-05-29 DIAGNOSIS — F418 Other specified anxiety disorders: Secondary | ICD-10-CM | POA: Diagnosis not present

## 2020-05-29 MED ORDER — VENLAFAXINE HCL ER 75 MG PO CP24: 225.0000 mg | ORAL_CAPSULE | Freq: Every day | ORAL | 1 refills | Status: DC

## 2020-05-29 MED ORDER — FLUTICASONE PROPIONATE HFA 44 MCG/ACT IN AERO
INHALATION_SPRAY | RESPIRATORY_TRACT | 12 refills | Status: DC
Start: 2020-05-29 — End: 2020-10-05

## 2020-05-29 MED ORDER — QUETIAPINE FUMARATE 50 MG PO TABS
50.0000 mg | ORAL_TABLET | Freq: Every day | ORAL | 1 refills | Status: DC
Start: 2020-05-29 — End: 2020-11-06

## 2020-05-29 MED ORDER — LORAZEPAM 0.5 MG PO TABS: 0.5000 mg | ORAL_TABLET | Freq: Two times a day (BID) | ORAL | 5 refills | Status: DC | PRN

## 2020-05-29 MED ORDER — ARIPIPRAZOLE 15 MG PO TABS: 15.0000 mg | ORAL_TABLET | Freq: Every day | ORAL | 1 refills | Status: DC

## 2020-05-29 MED ORDER — ALBUTEROL SULFATE HFA 108 (90 BASE) MCG/ACT IN AERS: 2.0000 | INHALATION_SPRAY | Freq: Four times a day (QID) | RESPIRATORY_TRACT | 1 refills | Status: DC | PRN

## 2020-05-29 NOTE — Progress Notes (Signed)
0

## 2020-05-29 NOTE — Patient Instructions (Addendum)
Stop lexapro.  After 4 weeks, try to not take ativan unless needed.   Increased abilify to 15 mg a day.  Increased seroquel to 50 mg tab a night.    effexor dose the same.  I also refilled your inhalers.   Next appt Mid June for your physical.

## 2020-05-29 NOTE — Progress Notes (Signed)
SUBJECTIVE Chief Complaint  Patient presents with  . Follow-up    [facility]   Patient Care Team    Relationship Specialty Notifications Start End  Ma Hillock, DO PCP - General Family Medicine  02/29/16   Rolm Bookbinder, MD Consulting Physician General Surgery  07/29/16      HPI: Dominique Taylor is a 45 y.o. female present for follow up on  depression and anxiety: Patient presents today for follow-up on depression anxiety and reports she feels really good now. She reports compliance with  lexapro 20 (never discontinued), Effexor 225, Abilify 10 mg QD, seroquel 25 mg QHS and ativan 0.5 TID PRN.   Asthma: Well controlled on flovent. Needs refills.   ROS: See pertinent positives and negatives per HPI.  Depression screen [facility] 2/9 05/29/2020 11/29/2019 09/14/2018 06/13/2018 01/10/2018  Decreased Interest 0 3 3 2  -  Down, Depressed, Hopeless 0 0 1 3 1   PHQ - 2 Score 0 3 4 5 1   Altered sleeping - 3 1 2 3   Tired, decreased energy - 3 1 2 3   Change in appetite - 3 0 3 2  Feeling bad or failure about yourself  - 1 1 2  0  Trouble concentrating - 3 1 3 2   Moving slowly or fidgety/restless - 0 0 0 0  Suicidal thoughts - 1 0 0 0  PHQ-9 Score - 17 8 17 11   Difficult doing work/chores - Extremely dIfficult Somewhat difficult Somewhat difficult -   GAD 7 : Generalized Anxiety Score 11/29/2019 09/14/2018 06/13/2018 01/10/2018  Nervous, Anxious, on Edge 3 1 2 3   Control/stop worrying 3 3 3 3   Worry too much - different things 3 3 3 3   Trouble relaxing 3 1 3 3   Restless 3 1 2 1   Easily annoyed or irritable 3 3 3 3   Afraid - awful might happen 1 1 3 3   Total GAD 7 Score 19 13 19 19   Anxiety Difficulty Extremely difficult Somewhat difficult Somewhat difficult -    Patient Active Problem List   Diagnosis Date Noted  . Mild persistent asthma 02/18/2019  . Encounter for smoking cessation counseling 02/18/2019  . CIN III (cervical intraepithelial neoplasia grade III) with severe dysplasia  04/21/2017  . Long-term current use of tamoxifen 01/09/2017  . Night sweats 01/09/2017  . Atypical lobular hyperplasia (ALH) of left breast 07/29/2016  . Depression with anxiety 02/29/2016  . Right carotid bruit 02/24/2015  . Tobacco use disorder 07/24/2014    Social History   Tobacco Use  . Smoking status: Current Every Day Smoker    Packs/day: 1.00    Years: 23.00    Pack years: 23.00    Types: Cigarettes  . Smokeless tobacco: Never Used  Substance Use Topics  . Alcohol use: No    Current Outpatient Medications:  .  albuterol (VENTOLIN HFA) 108 (90 Base) MCG/ACT inhaler, Inhale 2 puffs into the lungs every 6 (six) hours as needed for wheezing or shortness of breath., Disp: 18 g, Rfl: 1 .  ARIPiprazole (ABILIFY) 10 MG tablet, Take 1 tablet (10 mg total) by mouth daily., Disp: 90 tablet, Rfl: 1 .  cetirizine (ZYRTEC) 10 MG tablet, Take 1 tablet (10 mg total) by mouth daily., Disp: 90 tablet, Rfl: 3 .  escitalopram (LEXAPRO) 20 MG tablet, Take 20 mg by mouth daily., Disp: , Rfl:  .  fluticasone (FLOVENT HFA) 44 MCG/ACT inhaler, 4 puffs BID, Disp: 1 Inhaler, Rfl: 12 .  LORazepam (ATIVAN) 0.5 MG  tablet, Take 1 tablet (0.5 mg total) by mouth 2 (two) times daily as needed for anxiety., Disp: 60 tablet, Rfl: 5 .  Prenatal Vit-Fe Fumarate-FA (PRENATAL MULTIVITAMIN) TABS tablet, Take 1 tablet by mouth daily at 12 noon., Disp: , Rfl:  .  QUEtiapine (SEROQUEL) 25 MG tablet, Take 1-2 tablets (25-50 mg total) by mouth at bedtime., Disp: 180 tablet, Rfl: 1 .  tamoxifen (NOLVADEX) 20 MG tablet, TAKE 1 TABLET BY MOUTH ONCE DAILY AT BEDTIME, Disp: 30 tablet, Rfl: 11 .  venlafaxine XR (EFFEXOR-XR) 75 MG 24 hr capsule, Take 3 capsules (225 mg total) by mouth daily with breakfast., Disp: 270 capsule, Rfl: 1  Allergies  Allergen Reactions  . Wellbutrin [Bupropion] Itching    OBJECTIVE: BP 100/67   Pulse 80   Temp 98.8 F (37.1 C) (Oral)   Ht 5' 3.5" (1.613 m)   Wt 112 lb (50.8 kg)   LMP  05/22/2017 (Exact Date)   SpO2 97%   BMI 19.53 kg/m  Gen: Afebrile. No acute distress.  HENT: AT. [place]. Eyes:Pupils Equal Round Reactive to light, Extraocular movements intact,  Conjunctiva without redness, discharge or icterus. CV: RRR Chest: CTAB, no wheeze or crackles Neuro: Normal gait. PERLA. EOMi. Alert. Oriented x3 Psych: Normal affect, dress and demeanor. Normal speech. Normal thought content and judgment.   ASSESSMENT AND PLAN: Dominique Taylor is a 45 y.o. female present for  Asthma - stable.  - continue   flonase PRN -  continue flovent to 4 puffs BID.  - albuterol only if needed- Goal < 2 x weekly.    Depression with anxiety Stable.  DISCONTINUE Lexapro Continue  Effexor to 25 mg daily Increase Abilify 10 mg daily to 15 mg daily Increase  Seroquel to 50 mg qhs May continue Ativan twice daily only if needed -  NCCS database reviewed and appropriate 05/29/20 - f/u 6 mos- must be face to face encounter, sooner if needed   Orders Placed This Encounter  Procedures  . Flu Vaccine QUAD 6+ mos PF IM (Fluarix Quad PF)   No orders of the defined types were placed in this encounter.  Referral Orders  No referral(s) requested today       Howard Pouch, DO 05/29/2020

## 2020-08-25 ENCOUNTER — Other Ambulatory Visit: Payer: Self-pay | Admitting: Hematology

## 2020-08-25 ENCOUNTER — Other Ambulatory Visit: Payer: Self-pay

## 2020-08-25 ENCOUNTER — Ambulatory Visit
Admission: RE | Admit: 2020-08-25 | Discharge: 2020-08-25 | Disposition: A | Discharge status: Home / Self Care | Payer: Medicaid Other | Source: Ambulatory Visit | Attending: Hematology | Admitting: Hematology

## 2020-08-25 DIAGNOSIS — R928 Other abnormal and inconclusive findings on diagnostic imaging of breast: Secondary | ICD-10-CM

## 2020-08-25 DIAGNOSIS — N6320 Unspecified lump in the left breast, unspecified quadrant: Secondary | ICD-10-CM

## 2020-09-22 ENCOUNTER — Other Ambulatory Visit: Payer: Self-pay | Admitting: Family Medicine

## 2020-09-28 ENCOUNTER — Other Ambulatory Visit: Payer: Self-pay | Admitting: Family Medicine

## 2020-10-02 ENCOUNTER — Other Ambulatory Visit: Payer: Self-pay

## 2020-10-02 ENCOUNTER — Ambulatory Visit (INDEPENDENT_AMBULATORY_CARE_PROVIDER_SITE_OTHER): Payer: Medicaid Other | Admitting: Family Medicine

## 2020-10-02 ENCOUNTER — Encounter: Payer: Self-pay | Admitting: Family Medicine

## 2020-10-02 VITALS — BP 95/60 | HR 81 | Temp 98.7°F | Ht 64.0 in | Wt 108.0 lb

## 2020-10-02 DIAGNOSIS — F17200 Nicotine dependence, unspecified, uncomplicated: Secondary | ICD-10-CM | POA: Diagnosis not present

## 2020-10-02 DIAGNOSIS — J4530 Mild persistent asthma, uncomplicated: Secondary | ICD-10-CM | POA: Diagnosis not present

## 2020-10-02 DIAGNOSIS — J4531 Mild persistent asthma with (acute) exacerbation: Secondary | ICD-10-CM

## 2020-10-02 MED ORDER — FLUTICASONE-SALMETEROL 230-21 MCG/ACT IN AERO: 2.0000 | INHALATION_SPRAY | Freq: Two times a day (BID) | RESPIRATORY_TRACT | 12 refills | Status: DC

## 2020-10-02 MED ORDER — PREDNISONE 50 MG PO TABS: 50.0000 mg | ORAL_TABLET | Freq: Every day | ORAL | 0 refills | Status: DC

## 2020-10-02 NOTE — Progress Notes (Signed)
This visit occurred during the SARS-CoV-2 public health emergency.  Safety protocols were in place, including screening questions prior to the visit, additional usage of staff PPE, and extensive cleaning of exam room while observing appropriate contact time as indicated for disinfecting solutions.    Dominique Taylor , [birth year], 45 y.o., female MRN: [id number] Patient Care Team    Relationship Specialty Notifications Start End  Ma Hillock, DO PCP - General Family Medicine  02/29/16   Rolm Bookbinder, MD Consulting Physician General Surgery  07/29/16     Chief Complaint  Patient presents with  . Cough    Pt c/o productive cough with greenish tint and congestion x 5 mos;      Subjective: Pt presents for an OV with complaints of continuous cough that is intermittently productive since Janurary. She is a smoker. She reports compliance with inhalers of flovent and albuterol prn. She is complaint with daily zyrtec. She denies fever, chills, shortness of breath.  Depression screen [facility] 2/9 05/29/2020 11/29/2019 09/14/2018 06/13/2018 01/10/2018  Decreased Interest 0 3 3 2  -  Down, Depressed, Hopeless 0 0 1 3 1   PHQ - 2 Score 0 3 4 5 1   Altered sleeping - 3 1 2 3   Tired, decreased energy - 3 1 2 3   Change in appetite - 3 0 3 2  Feeling bad or failure about yourself  - 1 1 2  0  Trouble concentrating - 3 1 3 2   Moving slowly or fidgety/restless - 0 0 0 0  Suicidal thoughts - 1 0 0 0  PHQ-9 Score - 17 8 17 11   Difficult doing work/chores - Extremely dIfficult Somewhat difficult Somewhat difficult -    Allergies  Allergen Reactions  . Wellbutrin [Bupropion] Itching   Social History   Social History Narrative   [name] lives w/her husband & 2 grown children. She works Medical laboratory scientific officer.   Past Medical History:  Diagnosis Date  . Abnormal Pap smear   . Anxiety   . Atypical lobular hyperplasia (ALH) of left breast 07/29/2016  . Breast mass, left   . CIN III (cervical intraepithelial  neoplasia grade III) with severe dysplasia 04/21/2017   Path also showed leiomyoma, adenomyosis, and bilateral hydrosalpinx  . Depression   . HPV (human papilloma virus) infection   . Right carotid bruit 02/24/2015   Past Surgical History:  Procedure Laterality Date  . AUGMENTATION MAMMAPLASTY    . BREAST ENHANCEMENT SURGERY    . BREAST EXCISIONAL BIOPSY  2018  . BREAST SURGERY     left breast lumpectomy  . COLPOSCOPY    . RADIOACTIVE SEED GUIDED EXCISIONAL BREAST BIOPSY Left 06/21/2016   Procedure: LEFT BREAST RADIOACTIVE SEED GUIDED EXCISIONAL BIOPSY;  Surgeon: Rolm Bookbinder, MD;  Location: [place];  Service: General;  Laterality: Left;  . TUBAL LIGATION    . VAGINAL HYSTERECTOMY N/A 05/30/2017   Procedure: HYSTERECTOMY VAGINAL WITH BILATERAL PARTIAL SALPINGECTOMY;  Surgeon: Jonnie Kind, MD;  Location: [facility];  Service: Gynecology;  Laterality: N/A;   Family History  Problem Relation Age of Onset  . Arthritis Mother        RA  . Cancer Mother 72       cervical cancer   . Depression Father   . Hypertension Father   . Diabetes Father   . Ulcerative colitis Father   . Hypertension Brother   . Breast cancer Maternal Aunt    Allergies as of 10/02/2020  Reactions   Wellbutrin [bupropion] Itching      Medication List       Accurate as of Oct 02, 2020 11:59 PM. If you have any questions, ask your nurse or doctor.        STOP taking these medications   ARIPiprazole 15 MG tablet Commonly known as: Abilify Stopped by: Howard Pouch, DO   fluticasone 44 MCG/ACT inhaler Commonly known as: FLOVENT HFA Stopped by: Howard Pouch, DO     TAKE these medications   albuterol 108 (90 Base) MCG/ACT inhaler Commonly known as: VENTOLIN HFA Inhale 2 puffs into the lungs every 6 (six) hours as needed for wheezing or shortness of breath.   cetirizine 10 MG tablet Commonly known as: ZYRTEC Take 1 tablet (10 mg total) by mouth daily.    fluticasone-salmeterol 230-21 MCG/ACT inhaler Commonly known as: ADVAIR HFA Inhale 2 puffs into the lungs 2 (two) times daily. Started by: Howard Pouch, DO   LORazepam 0.5 MG tablet Commonly known as: ATIVAN Take 1 tablet (0.5 mg total) by mouth 2 (two) times daily as needed for anxiety.   predniSONE 50 MG tablet Commonly known as: DELTASONE Take 1 tablet (50 mg total) by mouth daily with breakfast. Started by: Howard Pouch, DO   prenatal multivitamin Tabs tablet Take 1 tablet by mouth daily at 12 noon.   QUEtiapine 50 MG tablet Commonly known as: SEROquel Take 1 tablet (50 mg total) by mouth at bedtime.   tamoxifen 20 MG tablet Commonly known as: NOLVADEX TAKE 1 TABLET BY MOUTH ONCE DAILY AT BEDTIME   venlafaxine XR 75 MG 24 hr capsule Commonly known as: EFFEXOR-XR Take 3 capsules (225 mg total) by mouth daily with breakfast.       All past medical history, surgical history, allergies, family history, immunizations andmedications were updated in the EMR today and reviewed under the history and medication portions of their EMR.     ROS: Negative, with the exception of above mentioned in HPI   Objective:  BP 95/60   Pulse 81   Temp 98.7 F (37.1 C) (Oral)   Ht 5\' 4"  (1.626 m)   Wt 108 lb (49 kg)   LMP 05/22/2017 (Exact Date)   SpO2 97%   BMI 18.54 kg/m  Body mass index is 18.54 kg/m. Gen: Afebrile. No acute distress. Nontoxic in appearance, well developed, well nourished.  HENT: AT. [place]. Eyes:Pupils Equal Round Reactive to light, Extraocular movements intact,  Conjunctiva without redness, discharge or icterus. Neck/lymp/endocrine: Supple,no lymphadenopathy CV: RRR no murmur, no edema Chest: CTAB, no wheeze or crackles. Good air movement, normal resp effort.   Neuro: Normal gait. PERLA. EOMi. Alert. Oriented x3 Psych: Normal affect, dress and demeanor. Normal speech. Normal thought content and judgment.  No exam data present No results found. No results  found for this or any previous visit (from the past 24 hour(s)).  Assessment/Plan: Dominique Taylor is a 45 y.o. female present for OV for   Mild persistent asthma, unspecified whether complicated/Mild persistent asthma with exacerbation Symptoms are effects of smoking, allergies and asthma. Do not believe there is an infectious component.  Prednisone burst prescribed.  DC flovent Start advair 2 puff BID Albuterol PRN She has a follow up scheduled in a few weeks for her [facility]- will recheck at that time.  Current every day smoker Strongly encouraged her to stop smoking. Discussed affects of smoking on the mucous production and throat.  Pt reported understanding and she is going to try harder.  Reviewed expectations re: course of current medical issues.  Discussed self-management of symptoms.  Outlined signs and symptoms indicating need for more acute intervention.  Patient verbalized understanding and all questions were answered.  Patient received an After-Visit Summary.    No orders of the defined types were placed in this encounter.  Meds ordered this encounter  Medications  . fluticasone-salmeterol (ADVAIR HFA) 230-21 MCG/ACT inhaler    Sig: Inhale 2 puffs into the lungs 2 (two) times daily.    Dispense:  1 each    Refill:  12  . predniSONE (DELTASONE) 50 MG tablet    Sig: Take 1 tablet (50 mg total) by mouth daily with breakfast.    Dispense:  5 tablet    Refill:  0   Referral Orders  No referral(s) requested today     Note is dictated utilizing voice recognition software. Although note has been proof read prior to signing, occasional typographical errors still can be missed. If any questions arise, please do not hesitate to call for verification.   electronically signed by:  Howard Pouch, DO  [place]

## 2020-10-02 NOTE — Patient Instructions (Signed)

## 2020-10-23 ENCOUNTER — Encounter: Payer: Medicaid Other | Admitting: Family Medicine

## 2020-11-09 ENCOUNTER — Encounter: Payer: Self-pay | Admitting: Family Medicine

## 2020-11-09 ENCOUNTER — Other Ambulatory Visit: Payer: Self-pay

## 2020-11-09 ENCOUNTER — Ambulatory Visit (INDEPENDENT_AMBULATORY_CARE_PROVIDER_SITE_OTHER): Payer: Medicaid Other | Admitting: Family Medicine

## 2020-11-09 VITALS — BP 110/72 | HR 76 | Temp 98.3°F | Ht 64.0 in | Wt 106.0 lb

## 2020-11-09 DIAGNOSIS — F17200 Nicotine dependence, unspecified, uncomplicated: Secondary | ICD-10-CM

## 2020-11-09 DIAGNOSIS — Z79810 Long term (current) use of selective estrogen receptor modulators (SERMs): Secondary | ICD-10-CM | POA: Diagnosis not present

## 2020-11-09 DIAGNOSIS — J4530 Mild persistent asthma, uncomplicated: Secondary | ICD-10-CM

## 2020-11-09 DIAGNOSIS — F418 Other specified anxiety disorders: Secondary | ICD-10-CM

## 2020-11-09 MED ORDER — VENLAFAXINE HCL ER 75 MG PO CP24: 225.0000 mg | ORAL_CAPSULE | Freq: Every day | ORAL | 1 refills | Status: DC

## 2020-11-09 MED ORDER — LORAZEPAM 0.5 MG PO TABS: 0.5000 mg | ORAL_TABLET | Freq: Two times a day (BID) | ORAL | 5 refills | Status: DC | PRN

## 2020-11-09 MED ORDER — QUETIAPINE FUMARATE 50 MG PO TABS
50.0000 mg | ORAL_TABLET | Freq: Every day | ORAL | 1 refills | Status: DC
Start: 2020-11-09 — End: 2021-06-23

## 2020-11-09 MED ORDER — ALBUTEROL SULFATE HFA 108 (90 BASE) MCG/ACT IN AERS: 2.0000 | INHALATION_SPRAY | Freq: Four times a day (QID) | RESPIRATORY_TRACT | 1 refills | Status: DC | PRN

## 2020-11-09 NOTE — Patient Instructions (Signed)
Great to see you today.  I have refilled your meds for you.   Next appt 5.5 mos unless needed sooner.   Keep cutting back on the smoking. Every little bit helps.

## 2020-11-09 NOTE — Progress Notes (Signed)
SUBJECTIVE Chief Complaint  Patient presents with   Depression    [place]; Pt is fasting   Patient Care Team    Relationship Specialty Notifications Start End  Ma Hillock, DO PCP - General Family Medicine  02/29/16   Rolm Bookbinder, MD Consulting Physician General Surgery  07/29/16      HPI: Dominique Taylor is a 45 y.o. female present for follow up on  depression and anxiety: Patient presents today for follow-up on depression anxiety and reports she feels okay.  She reports compliance with  Effexor 225,  seroquel 50 mg QHS and ativan 0.5 TID PRN.  She reports she feels she is sleeping better now that she started Seroquel in place of Abilify.  She would like to stay on current doses of medications for now.  Asthma/tobacco use disorder: Patient reports her asthma/COPD is much better controlled today than when seen last when she had an exacerbation.  She reports compliance with Advair 2 puffs twice a day.  She states she feels much better since starting the Advair.  She is compliant with Zyrtec and using the albuterol only when needed.  She also reports since seen last she has cut back on the amount of cigarettes she is smoking daily.  And she hopes to continue to do so.  ROS: See pertinent positives and negatives per HPI.  Depression screen [facility] 2/9 11/09/2020 05/29/2020 11/29/2019 09/14/2018 06/13/2018  Decreased Interest 2 0 _0 Down, Depressed, Hopeless 3 0 0 1 3  PHQ - 2 Score 5 0 _1 Altered sleeping 3 - _2 Tired, decreased energy 3 - _3 Change in appetite 2 - 3 0 3  Feeling bad or failure about yourself  3 - _4 Trouble concentrating 2 - _5 Moving slowly or fidgety/restless 0 - 0 0 0  Suicidal thoughts 0 - 1 0 0  PHQ-9 Score 18 - _6 Difficult doing work/chores - - Extremely dIfficult Somewhat difficult Somewhat difficult   GAD 7 : Generalized Anxiety Score 11/09/2020 11/29/2019 09/14/2018 06/13/2018  Nervous, Anxious, on Edge _7 Control/stop worrying _8 Worry too much - different things _9 Trouble relaxing _10 Restless 0 _11 Easily annoyed or irritable _12 Afraid - awful might happen 0 _13 Total GAD 7 Score _14 Anxiety Difficulty - Extremely difficult Somewhat difficult Somewhat difficult    Patient Active Problem List   Diagnosis Date Noted   Mild persistent asthma 02/18/2019   Long-term current use of tamoxifen 01/09/2017   Night sweats 01/09/2017   Depression with anxiety 02/29/2016   Tobacco use disorder 07/24/2014    Social History   Tobacco Use   Smoking status: Every Day    Packs/day: 1.00    Years: 23.00    Pack years: 23.00    Types: Cigarettes   Smokeless tobacco: Never  Substance Use Topics   Alcohol use: No    Current Outpatient Medications:    cetirizine (ZYRTEC) 10 MG tablet, Take 1 tablet (10 mg total) by mouth daily., Disp: 90 tablet, Rfl: 3   fluticasone-salmeterol (ADVAIR HFA) 230-21 MCG/ACT inhaler, Inhale 2 puffs into the lungs 2 (two) times daily., Disp: 1 each, Rfl: 12   Prenatal Vit-Fe Fumarate-FA (PRENATAL MULTIVITAMIN) TABS  tablet, Take 1 tablet by mouth daily at 12 noon., Disp: , Rfl:    tamoxifen (NOLVADEX) 20 MG tablet, TAKE 1 TABLET BY MOUTH ONCE DAILY AT BEDTIME, Disp: 30 tablet, Rfl: 11   albuterol (VENTOLIN HFA) 108 (90 Base) MCG/ACT inhaler, Inhale 2 puffs into the lungs every 6 (six) hours as needed for wheezing or shortness of breath., Disp: 18 g, Rfl: 1   LORazepam (ATIVAN) 0.5 MG tablet, Take 1 tablet (0.5 mg total) by mouth 2 (two) times daily as needed for anxiety., Disp: 60 tablet, Rfl: 5   QUEtiapine (SEROQUEL) 50 MG tablet, Take 1 tablet (50 mg total) by mouth at bedtime., Disp: 90 tablet, Rfl: 1   venlafaxine XR (EFFEXOR-XR) 75 MG 24 hr capsule, Take 3 capsules (225 mg total) by mouth daily with breakfast., Disp: 270 capsule, Rfl: 1  Allergies  Allergen Reactions   Wellbutrin [Bupropion] Itching    OBJECTIVE: BP  110/72   Pulse 76   Temp 98.3 F (36.8 C) (Oral)   Ht 5' 4" (1.626 m)   Wt 106 lb (48.1 kg)   LMP 05/22/2017 (Exact Date)   SpO2 98%   BMI 18.19 kg/m  Gen: Afebrile. No acute distress.  Nontoxic, very pleasant female.  Thin HENT: AT. [place].  Eyes:Pupils Equal Round Reactive to light, Extraocular movements intact,  Conjunctiva without redness, discharge or icterus. Neck/lymp/endocrine: Supple, no lymphadenopathy, no thyromegaly CV: RRR no murmur, no edema, +2/4 P posterior tibialis pulses Chest: CTAB, no wheeze or crackles Skin: No rashes, purpura or petechiae.  Neuro:  Normal gait. PERLA. EOMi. Alert. Oriented x3 Psych: Normal affect, dress and demeanor. Normal speech. Normal thought content and judgment.    ASSESSMENT AND PLAN: Dominique Taylor is a 45 y.o. female present for  Asthma/daily smoker -Much improved since starting Advair. -Continue Flonase and Zyrtec. -Continue Advair 2 puffs twice daily -Continue to cut back on cigarette smoking.  She has made this for step and I congratulated her. - albuterol only if needed- Goal < 2 x weekly.   Depression with anxiety Patient reports condition is improving and stable.  Her PHQ and gad scores are still significantly elevated. Continue Effexor to 225 mg daily Continue Seroquel to 50 mg qhs Continue Ativan twice daily only if needed -TSH collected today -  NCCS database reviewed and appropriate 11/09/20 - f/u 6 mos- must be face to face encounter, sooner if needed  Tamoxifen long-term use: -Labs collected today lipids, A1c, CBC, CMP    Orders Placed This Encounter  Procedures   Lipid panel   Hemoglobin A1c   Vitamin D (25 hydroxy)   CBC w/Diff   Comp Met (CMET)   TSH    Meds ordered this encounter  Medications   QUEtiapine (SEROQUEL) 50 MG tablet    Sig: Take 1 tablet (50 mg total) by mouth at bedtime.    Dispense:  90 tablet    Refill:  1   venlafaxine XR (EFFEXOR-XR) 75 MG 24 hr capsule    Sig: Take 3 capsules  (225 mg total) by mouth daily with breakfast.    Dispense:  270 capsule    Refill:  1   albuterol (VENTOLIN HFA) 108 (90 Base) MCG/ACT inhaler    Sig: Inhale 2 puffs into the lungs every 6 (six) hours as needed for wheezing or shortness of breath.    Dispense:  18 g    Refill:  1   LORazepam (ATIVAN) 0.5 MG tablet    Sig: Take 1  tablet (0.5 mg total) by mouth 2 (two) times daily as needed for anxiety.    Dispense:  60 tablet    Refill:  5    Referral Orders  No referral(s) requested today       Howard Pouch, DO 11/09/2020

## 2020-11-10 LAB — COMPREHENSIVE METABOLIC PANEL
ALT: 13 U/L (ref 0–35)
AST: 17 U/L (ref 0–37)
Albumin: 4.2 g/dL (ref 3.5–5.2)
Alkaline Phosphatase: 61 U/L (ref 39–117)
BUN: 10 mg/dL (ref 6–23)
CO2: 30 mEq/L (ref 19–32)
Calcium: 9.3 mg/dL (ref 8.4–10.5)
Chloride: 104 mEq/L (ref 96–112)
Creatinine, Ser: 0.63 mg/dL (ref 0.40–1.20)
GFR: 105.66 mL/min (ref >60.00)
Glucose, Bld: 81 mg/dL (ref 70–99)
Potassium: 3.8 mEq/L (ref 3.5–5.1)
Sodium: 142 mEq/L (ref 135–145)
Total Bilirubin: 0.2 mg/dL (ref 0.2–1.2)
Total Protein: 6.8 g/dL (ref 6.0–8.3)

## 2020-11-10 LAB — CBC WITH DIFFERENTIAL/PLATELET
Basophils Absolute: 0.1 10*3/uL (ref 0.0–0.1)
Basophils Relative: 0.9 % (ref 0.0–3.0)
Eosinophils Absolute: 0.1 10*3/uL (ref 0.0–0.7)
Eosinophils Relative: 1 % (ref 0.0–5.0)
HCT: 41.8 % (ref 36.0–46.0)
Hemoglobin: 14.2 g/dL (ref 12.0–15.0)
Lymphocytes Relative: 26.9 % (ref 12.0–46.0)
Lymphs Abs: 2.4 10*3/uL (ref 0.7–4.0)
MCHC: 33.9 g/dL (ref 30.0–36.0)
MCV: 103.6 fl — ABNORMAL HIGH (ref 78.0–100.0)
Monocytes Absolute: 0.6 10*3/uL (ref 0.1–1.0)
Monocytes Relative: 7.2 % (ref 3.0–12.0)
Neutro Abs: 5.7 10*3/uL (ref 1.4–7.7)
Neutrophils Relative %: 64 % (ref 43.0–77.0)
Platelets: 281 10*3/uL (ref 150.0–400.0)
RBC: 4.04 Mil/uL (ref 3.87–5.11)
RDW: 13.8 % (ref 11.5–15.5)
WBC: 8.9 10*3/uL (ref 4.0–10.5)

## 2020-11-10 LAB — VITAMIN D 25 HYDROXY (VIT D DEFICIENCY, FRACTURES): Vit D, 25-Hydroxy: 33 ng/mL (ref 30–100)

## 2020-11-10 LAB — TSH: TSH: 0.49 mIU/L

## 2020-11-10 LAB — HEMOGLOBIN A1C
Hgb A1c MFr Bld: 4.8 % of total Hgb (ref <5.7)
Mean Plasma Glucose: 91 mg/dL
eAG (mmol/L): 5 mmol/L

## 2020-11-10 LAB — LIPID PANEL
Cholesterol: 138 mg/dL (ref <200)
HDL: 53 mg/dL (ref >=50)
LDL Cholesterol (Calc): 67 mg/dL (calc)
Non-HDL Cholesterol (Calc): 85 mg/dL (calc) (ref <130)
Total CHOL/HDL Ratio: 2.6 (calc) (ref <5.0)
Triglycerides: 97 mg/dL (ref <150)

## 2020-11-26 ENCOUNTER — Encounter: Payer: Self-pay | Admitting: Family Medicine

## 2021-02-05 ENCOUNTER — Other Ambulatory Visit: Payer: Medicaid Other

## 2021-02-05 ENCOUNTER — Ambulatory Visit: Payer: Medicaid Other | Admitting: Hematology

## 2021-02-25 ENCOUNTER — Ambulatory Visit
Admission: RE | Admit: 2021-02-25 | Discharge: 2021-02-25 | Disposition: A | Discharge status: Home / Self Care | Payer: Medicaid Other | Source: Ambulatory Visit | Attending: Hematology | Admitting: Hematology

## 2021-02-25 ENCOUNTER — Other Ambulatory Visit: Payer: Self-pay

## 2021-02-25 ENCOUNTER — Other Ambulatory Visit: Payer: Self-pay | Admitting: Hematology

## 2021-02-25 DIAGNOSIS — N6320 Unspecified lump in the left breast, unspecified quadrant: Secondary | ICD-10-CM

## 2021-06-22 ENCOUNTER — Other Ambulatory Visit: Payer: Self-pay | Admitting: Family Medicine

## 2021-06-28 ENCOUNTER — Other Ambulatory Visit: Payer: Self-pay

## 2021-06-29 ENCOUNTER — Ambulatory Visit (INDEPENDENT_AMBULATORY_CARE_PROVIDER_SITE_OTHER): Payer: Medicaid Other | Admitting: Family Medicine

## 2021-06-29 ENCOUNTER — Encounter: Payer: Self-pay | Admitting: Family Medicine

## 2021-06-29 VITALS — BP 99/65 | HR 85 | Temp 97.5°F | Ht 64.0 in | Wt 99.0 lb

## 2021-06-29 DIAGNOSIS — Z79810 Long term (current) use of selective estrogen receptor modulators (SERMs): Secondary | ICD-10-CM

## 2021-06-29 DIAGNOSIS — F418 Other specified anxiety disorders: Secondary | ICD-10-CM | POA: Diagnosis not present

## 2021-06-29 DIAGNOSIS — F17200 Nicotine dependence, unspecified, uncomplicated: Secondary | ICD-10-CM

## 2021-06-29 DIAGNOSIS — J4530 Mild persistent asthma, uncomplicated: Secondary | ICD-10-CM

## 2021-06-29 DIAGNOSIS — Z23 Encounter for immunization: Secondary | ICD-10-CM

## 2021-06-29 MED ORDER — CETIRIZINE HCL 10 MG PO TABS: 10.0000 mg | ORAL_TABLET | Freq: Every day | ORAL | 3 refills | Status: AC

## 2021-06-29 MED ORDER — VENLAFAXINE HCL ER 75 MG PO CP24: 225.0000 mg | ORAL_CAPSULE | Freq: Every day | ORAL | 1 refills | Status: AC

## 2021-06-29 MED ORDER — QUETIAPINE FUMARATE 50 MG PO TABS: ORAL_TABLET | ORAL | 1 refills | Status: AC

## 2021-06-29 MED ORDER — LORAZEPAM 0.5 MG PO TABS: 0.5000 mg | ORAL_TABLET | Freq: Two times a day (BID) | ORAL | 5 refills | Status: AC | PRN

## 2021-06-29 MED ORDER — FLUTICASONE-SALMETEROL 230-21 MCG/ACT IN AERO: 2.0000 | INHALATION_SPRAY | Freq: Two times a day (BID) | RESPIRATORY_TRACT | 12 refills | Status: AC

## 2021-06-29 MED ORDER — ALBUTEROL SULFATE HFA 108 (90 BASE) MCG/ACT IN AERS: 2.0000 | INHALATION_SPRAY | Freq: Four times a day (QID) | RESPIRATORY_TRACT | 11 refills | Status: AC | PRN

## 2021-06-29 NOTE — Patient Instructions (Addendum)
°  Next appt in 5.5 months. Fasting labs are due at that visit.

## 2021-06-29 NOTE — Progress Notes (Signed)
SUBJECTIVE Chief Complaint  Patient presents with   Anxiety    [facility]; pt is not fasting   Patient Care Team    Relationship Specialty Notifications Start End  Ma Hillock, DO PCP - General Family Medicine  02/29/16   Rolm Bookbinder, MD Consulting Physician General Surgery  07/29/16      HPI: Dominique Taylor is a 46 y.o. female present for follow up on  depression and anxiety: Patient presents today for follow-up on depression anxiety and reports she feels okay.  She reports compliance with  Effexor 225,  seroquel 50 mg QHS and ativan 0.5 TID PRN.  She reports she feels she is sleeping better now that she started Seroquel in place of Abilify.  She would like to stay on current doses of medications for now.  Asthma/tobacco use disorder: Patient reports her asthma/COPD is much better controlled today than when seen last when she had an exacerbation.  She reports compliance with Advair 2 puffs twice a day.  She states she feels much better since starting the Advair.  She is compliant with Zyrtec and using the albuterol only when needed.  She also reports since seen last she has cut back on the amount of cigarettes she is smoking daily.  And she hopes to continue to do so.  ROS: Per above.   Depression screen [facility] 2/9 06/29/2021 11/09/2020 05/29/2020 11/29/2019 09/14/2018  Decreased Interest 3 2 0 3 3  Down, Depressed, Hopeless 2 3 0 0 1  PHQ - 2 Score 5 5 0 3 4  Altered sleeping 3 3 - 3 1  Tired, decreased energy 3 3 - 3 1  Change in appetite 3 2 - 3 0  Feeling bad or failure about yourself  2 3 - 1 1  Trouble concentrating 2 2 - 3 1  Moving slowly or fidgety/restless 1 0 - 0 0  Suicidal thoughts 1 0 - 1 0  PHQ-9 Score 20 18 - 17 8  Difficult doing work/chores - - - Extremely dIfficult Somewhat difficult  Some recent data might be hidden   GAD 7 : Generalized Anxiety Score 06/29/2021 11/09/2020 11/29/2019 09/14/2018  Nervous, Anxious, on Edge 3 3 3 1   Control/stop worrying 3 3 3  3   Worry too much - different things 3 3 3 3   Trouble relaxing 3 2 3 1   Restless 3 0 3 1  Easily annoyed or irritable 2 3 3 3   Afraid - awful might happen 2 0 1 1  Total GAD 7 Score 19 14 19 13   Anxiety Difficulty - - Extremely difficult Somewhat difficult    Patient Active Problem List   Diagnosis Date Noted   Mild persistent asthma 02/18/2019   Long-term current use of tamoxifen 01/09/2017   Night sweats 01/09/2017   Depression with anxiety 02/29/2016   Tobacco use disorder 07/24/2014    Social History   Tobacco Use   Smoking status: Every Day    Packs/day: 1.00    Years: 23.00    Pack years: 23.00    Types: Cigarettes   Smokeless tobacco: Never  Substance Use Topics   Alcohol use: No    Current Outpatient Medications:    Prenatal Vit-Fe Fumarate-FA (PRENATAL MULTIVITAMIN) TABS tablet, Take 1 tablet by mouth daily at 12 noon., Disp: , Rfl:    tamoxifen (NOLVADEX) 20 MG tablet, TAKE 1 TABLET BY MOUTH ONCE DAILY AT BEDTIME, Disp: 30 tablet, Rfl: 11   albuterol (VENTOLIN HFA) 108 (90  Base) MCG/ACT inhaler, Inhale 2 puffs into the lungs every 6 (six) hours as needed for wheezing or shortness of breath., Disp: 18 g, Rfl: 11   cetirizine (ZYRTEC) 10 MG tablet, Take 1 tablet (10 mg total) by mouth daily., Disp: 90 tablet, Rfl: 3   fluticasone-salmeterol (ADVAIR HFA) 230-21 MCG/ACT inhaler, Inhale 2 puffs into the lungs 2 (two) times daily., Disp: 1 each, Rfl: 12   LORazepam (ATIVAN) 0.5 MG tablet, Take 1 tablet (0.5 mg total) by mouth 2 (two) times daily as needed for anxiety., Disp: 60 tablet, Rfl: 5   QUEtiapine (SEROQUEL) 50 MG tablet, 1/2 tab with breakfast and 1 tab before bed., Disp: 135 tablet, Rfl: 1   venlafaxine XR (EFFEXOR-XR) 75 MG 24 hr capsule, Take 3 capsules (225 mg total) by mouth daily with breakfast., Disp: 270 capsule, Rfl: 1  Allergies  Allergen Reactions   Wellbutrin [Bupropion] Itching    OBJECTIVE: BP 99/65    Pulse 85    Temp (!) 97.5 F (36.4 C)  (Oral)    Ht 5\' 4"  (1.626 m)    Wt 99 lb (44.9 kg)    LMP 05/22/2017 (Exact Date)    SpO2 97%    BMI 16.99 kg/m  Physical Exam Vitals and nursing note reviewed.  Constitutional:      General: She is not in acute distress.    Appearance: Normal appearance. She is not ill-appearing, toxic-appearing or diaphoretic.  HENT:     Head: Normocephalic and atraumatic.  Eyes:     General: No scleral icterus.       Right eye: No discharge.        Left eye: No discharge.     Extraocular Movements: Extraocular movements intact.     Conjunctiva/sclera: Conjunctivae normal.     Pupils: Pupils are equal, round, and reactive to light.  Cardiovascular:     Rate and Rhythm: Normal rate and regular rhythm.  Pulmonary:     Effort: Pulmonary effort is normal. No respiratory distress.     Breath sounds: Normal breath sounds. No wheezing, rhonchi or rales.  Musculoskeletal:     Cervical back: Neck supple. No tenderness.  Lymphadenopathy:     Cervical: No cervical adenopathy.  Skin:    General: Skin is warm and dry.     Coloration: Skin is not jaundiced or pale.     Findings: No erythema or rash.  Neurological:     Mental Status: She is alert and oriented to person, place, and time. Mental status is at baseline.     Motor: No weakness.     Gait: Gait normal.  Psychiatric:        Mood and Affect: Mood normal.        Behavior: Behavior normal.        Thought Content: Thought content normal.        Judgment: Judgment normal.     ASSESSMENT AND PLAN: Dominique Taylor is a 46 y.o. female present for  Asthma/daily smoker -stable.  Continue Advair 2 puffs BID -Continue Flonase and Zyrtec.e daily -Continue to cut back on cigarette smoking.  - albuterol only if needed- Goal < 2 x weekly.   Depression with anxiety Pt reports mild increase in anxiety, despite her phq/gad scores remaining quite high. We discussed options today Continue  Effexor to 225 mg daily Increase  Seroquel 50 mg qhs AND added  25 mg with breakfast Continue  Ativan twice daily only if needed -  [employer] reviewed and  appropriate 06/29/21 - f/u 5.5 mos- must be face to face encounter, sooner if needed  Tamoxifen long-term use: -tolerating.    No orders of the defined types were placed in this encounter.   Meds ordered this encounter  Medications   albuterol (VENTOLIN HFA) 108 (90 Base) MCG/ACT inhaler    Sig: Inhale 2 puffs into the lungs every 6 (six) hours as needed for wheezing or shortness of breath.    Dispense:  18 g    Refill:  11   cetirizine (ZYRTEC) 10 MG tablet    Sig: Take 1 tablet (10 mg total) by mouth daily.    Dispense:  90 tablet    Refill:  3   fluticasone-salmeterol (ADVAIR HFA) 230-21 MCG/ACT inhaler    Sig: Inhale 2 puffs into the lungs 2 (two) times daily.    Dispense:  1 each    Refill:  12   QUEtiapine (SEROQUEL) 50 MG tablet    Sig: 1/2 tab with breakfast and 1 tab before bed.    Dispense:  135 tablet    Refill:  1   venlafaxine XR (EFFEXOR-XR) 75 MG 24 hr capsule    Sig: Take 3 capsules (225 mg total) by mouth daily with breakfast.    Dispense:  270 capsule    Refill:  1   LORazepam (ATIVAN) 0.5 MG tablet    Sig: Take 1 tablet (0.5 mg total) by mouth 2 (two) times daily as needed for anxiety.    Dispense:  60 tablet    Refill:  5    Referral Orders  No referral(s) requested today     Howard Pouch, DO 06/29/2021

## 2021-11-08 IMAGING — US US BREAST*L* LIMITED INC AXILLA
1 series · 9 of 9 positions shown · non-contrast
Comparison: Previous exam(s).

CLINICAL DATA: 45-year-old female presenting for annual exam as
well as 1 year follow-up of probably benign left breast masses.

EXAM:
DIGITAL DIAGNOSTIC BILATERAL MAMMOGRAM WITH IMPLANTS, CAD AND
TOMOSYNTHESIS; ULTRASOUND LEFT BREAST LIMITED
TECHNIQUE: Bilateral digital diagnostic mammography and breast tomosynthesis
was performed. The images were evaluated with computer-aided
detection. Standard and/or implant displaced views were performed.;
Targeted ultrasound examination of the left breast was performed.

[Series 1: us breast*left* limited inc axilla · 0.05mm/px · 9 of 9 slices shown]
[im 1/9]
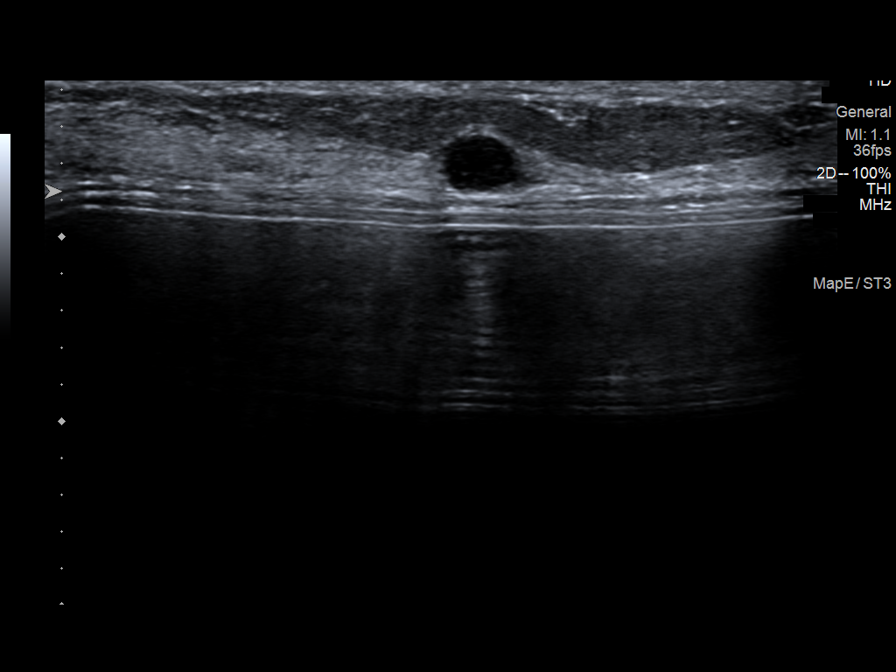
[im 2/9]
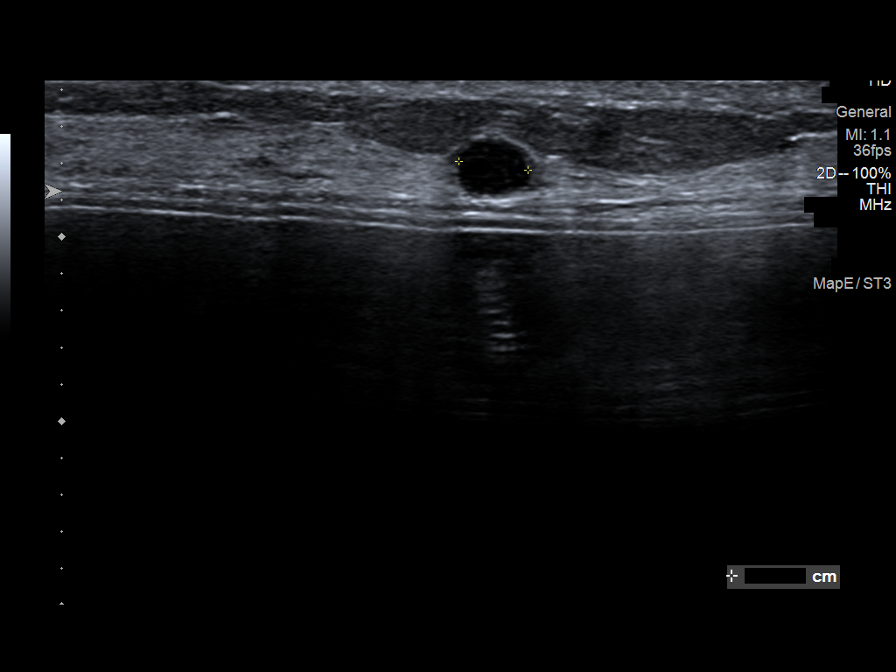
[im 3/9]
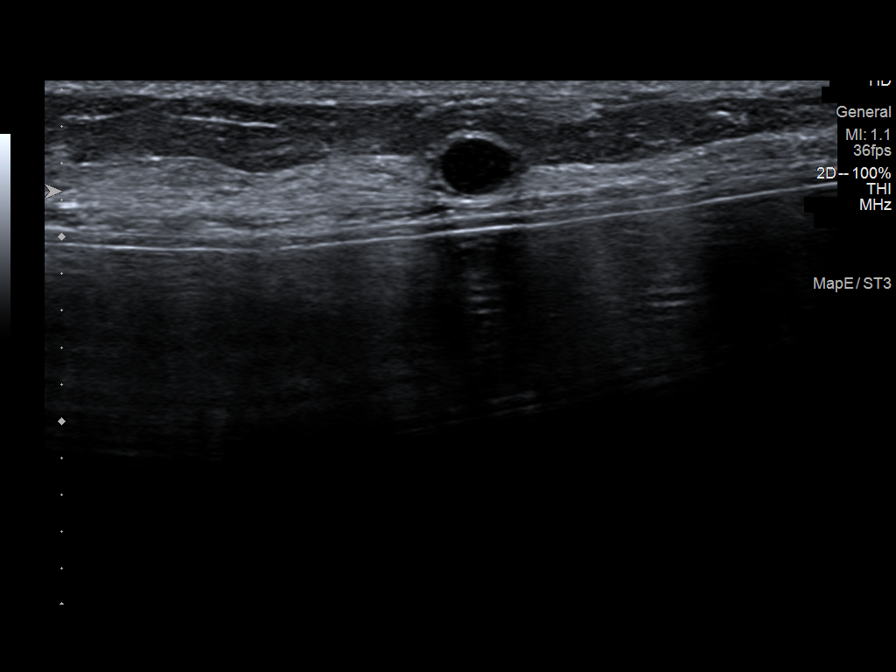
[im 4/9]
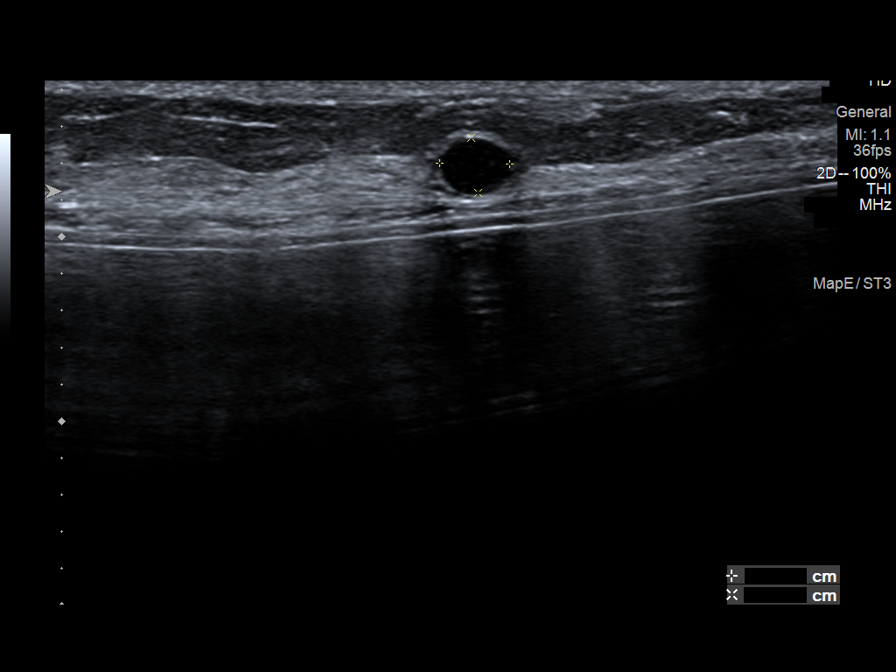
[im 5/9]
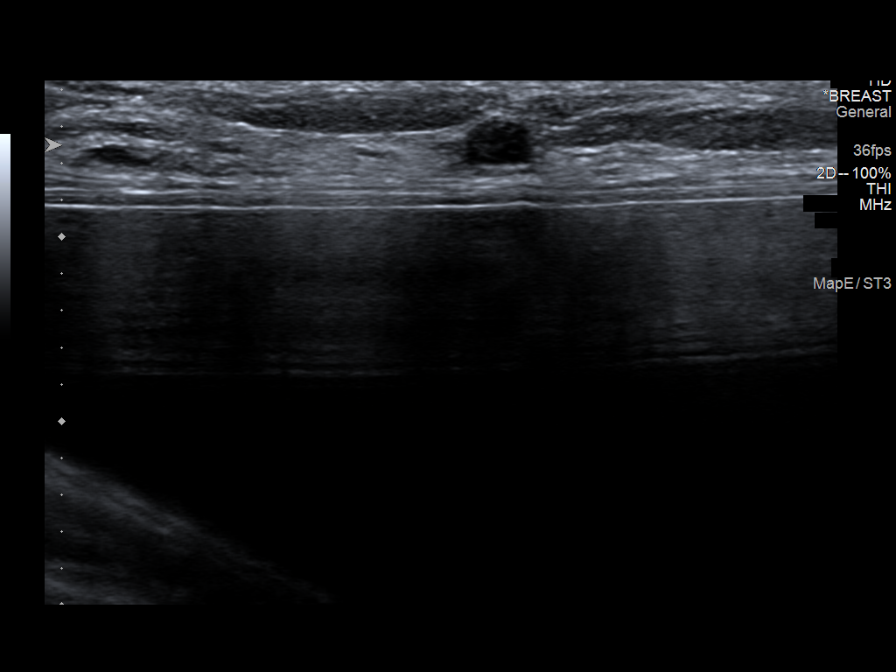
[im 6/9]
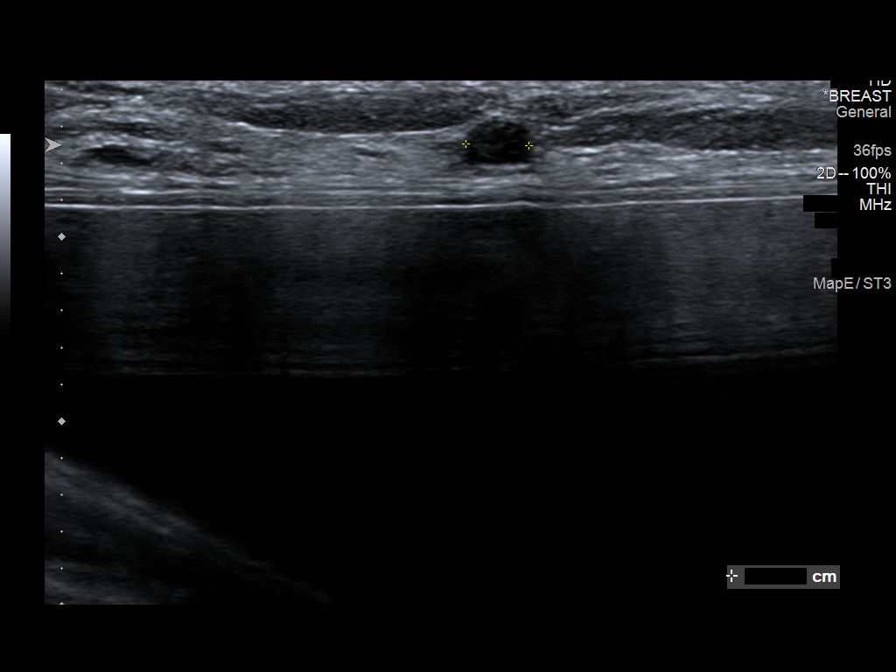
[im 7/9]
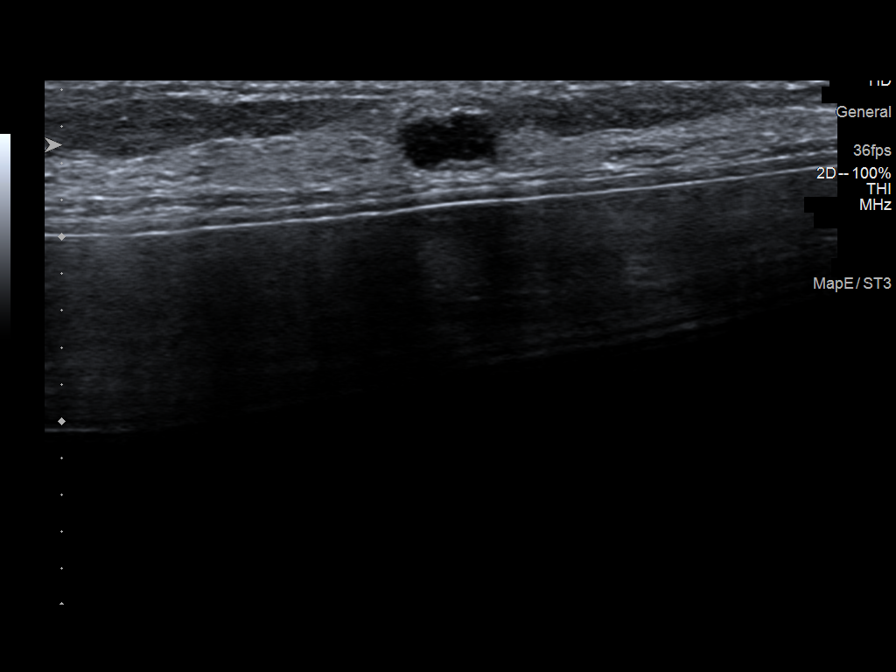
[im 8/9]
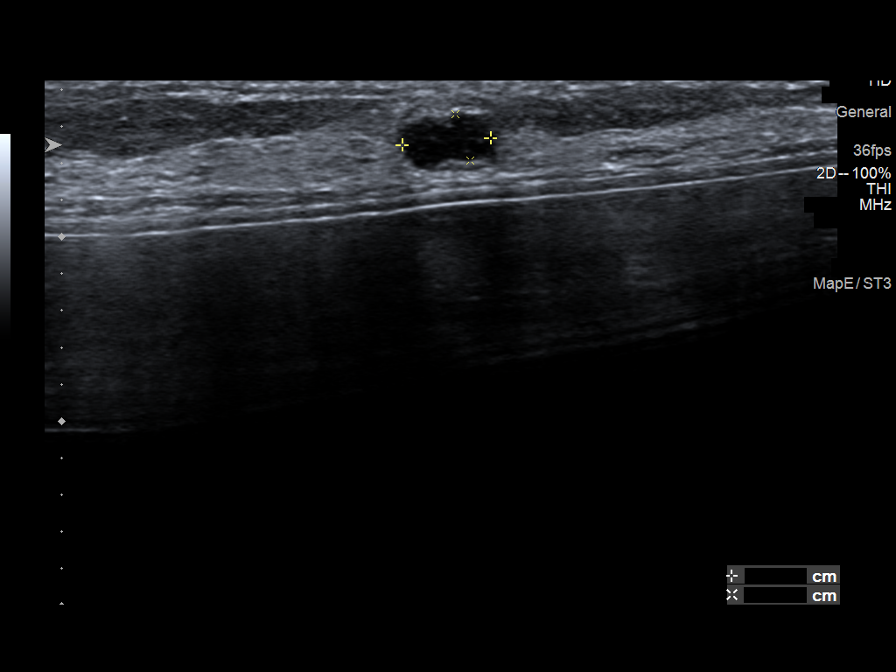
[im 9/9]
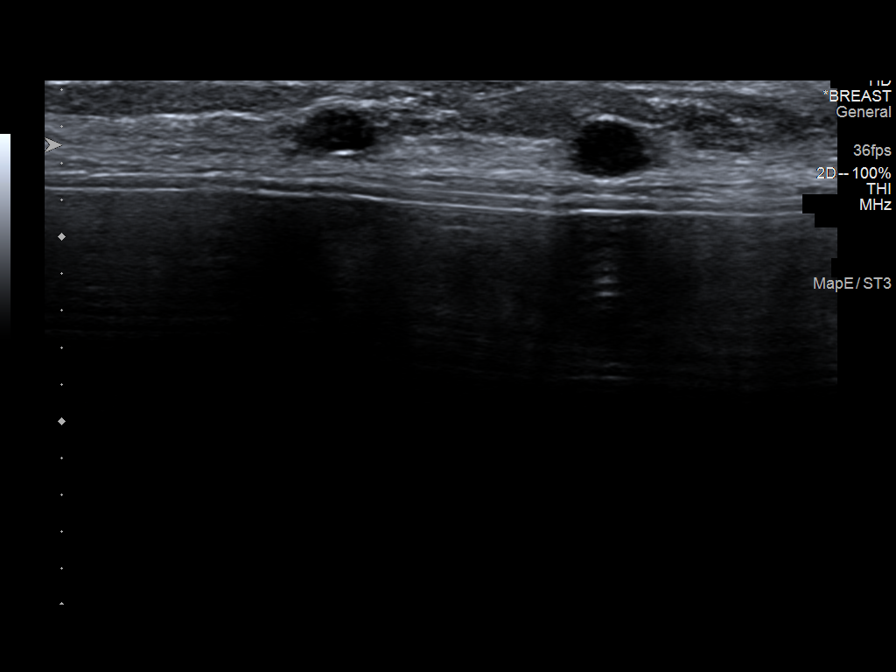

[9 of 9 positions shown; findings below may reference images not displayed]

ACR Breast Density Category c: The breast tissue is heterogeneously
dense, which may obscure small masses.
FINDINGS: Mammogram:

The patient has retropectoral implants.

Right breast: No suspicious mass, distortion, or microcalcifications
are identified to suggest presence of malignancy.

Left breast: The previously seen mass within the superior left
breast is not definitely visualized today.

Ultrasound:

Targeted ultrasound performed in the left breast at 2 o'clock 2 cm
from the nipple demonstrating adjacent oval circumscribed nearly
anechoic masses measuring 0.4 x 0.3 x 0.4 cm, previously 0.4 x 0.3 x
0.4 cm, and 0.3 x 0.3 x 0.5 cm, previously 0.4 x 0.3 x 0.5 cm.
IMPRESSION: 1.  Stable probably benign left breast masses at 2 o'clock.

2.  No mammographic evidence of malignancy in the right breast.

RECOMMENDATION:
Diagnostic bilateral mammogram and left breast ultrasound in 1 year.

I have discussed the findings and recommendations with the patient.
If applicable, a reminder letter will be sent to the patient
regarding the next appointment.

BI-RADS CATEGORY  3: Probably benign.

## 2021-11-08 IMAGING — MG MM  DIGITAL DIAGNOSTIC BREAST BILAT IMPLANT W/ TOMO W/ CAD
8 of 14 series · 8 of 34 positions shown · non-contrast
Comparison: Previous exam(s).

CLINICAL DATA: 45-year-old female presenting for annual exam as
well as 1 year follow-up of probably benign left breast masses.

EXAM:
DIGITAL DIAGNOSTIC BILATERAL MAMMOGRAM WITH IMPLANTS, CAD AND
TOMOSYNTHESIS; ULTRASOUND LEFT BREAST LIMITED
TECHNIQUE: Bilateral digital diagnostic mammography and breast tomosynthesis
was performed. The images were evaluated with computer-aided
detection. Standard and/or implant displaced views were performed.;
Targeted ultrasound examination of the left breast was performed.

[R CC]
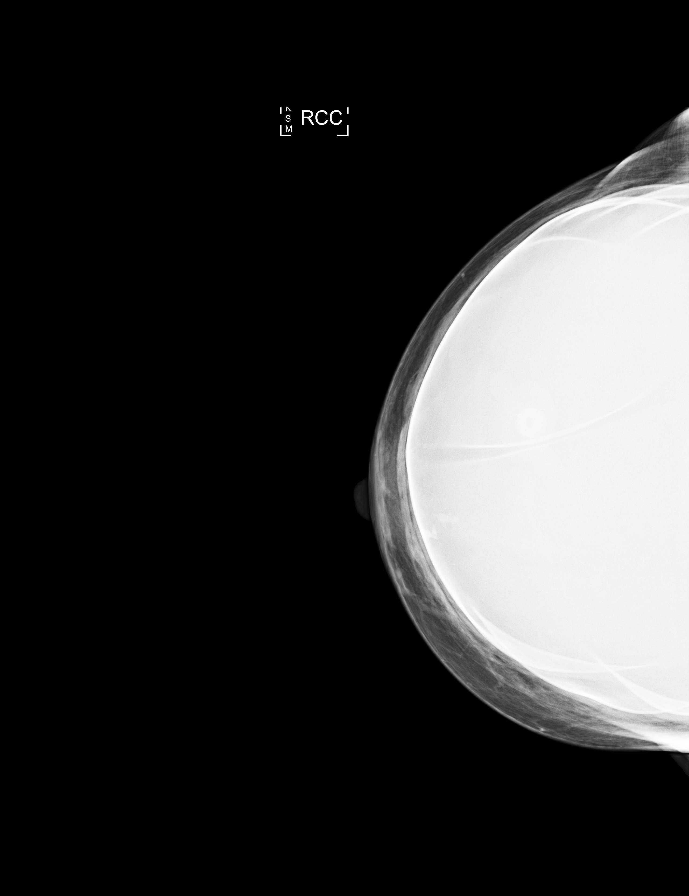

[L CC]
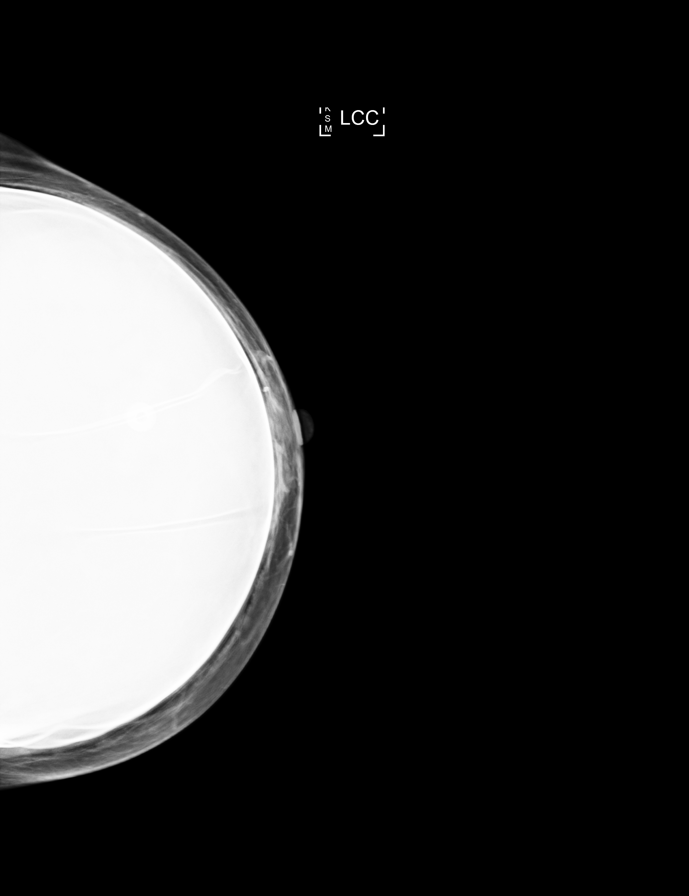

[R MLO]
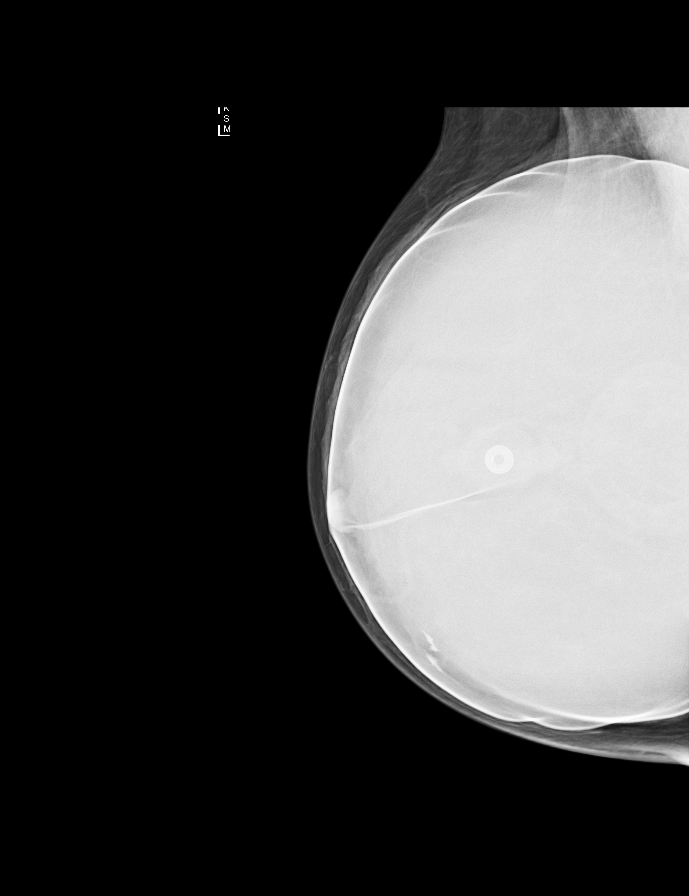

[L MLO]
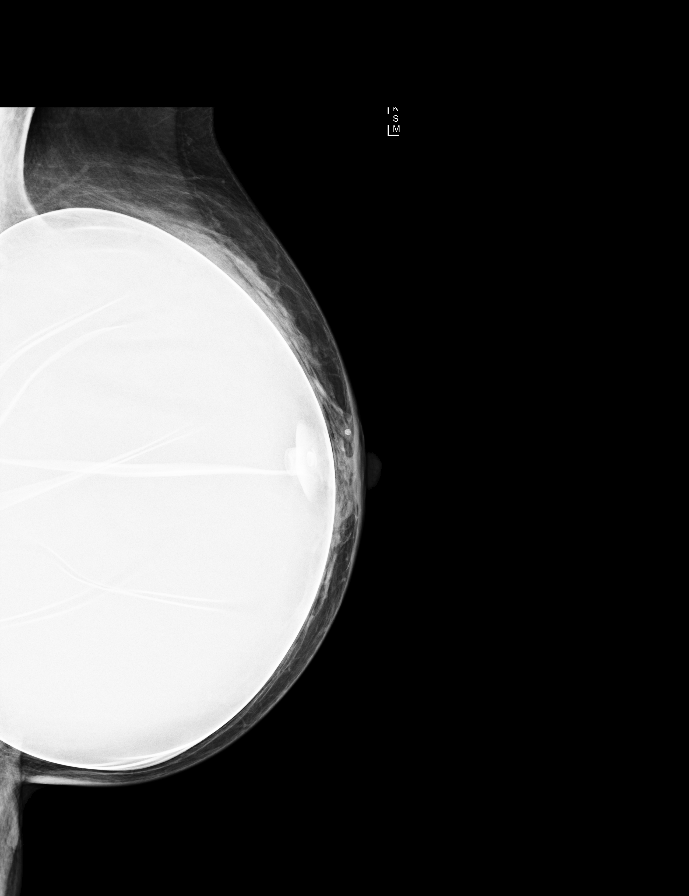

[R CC synth-2D (1 of 2)]
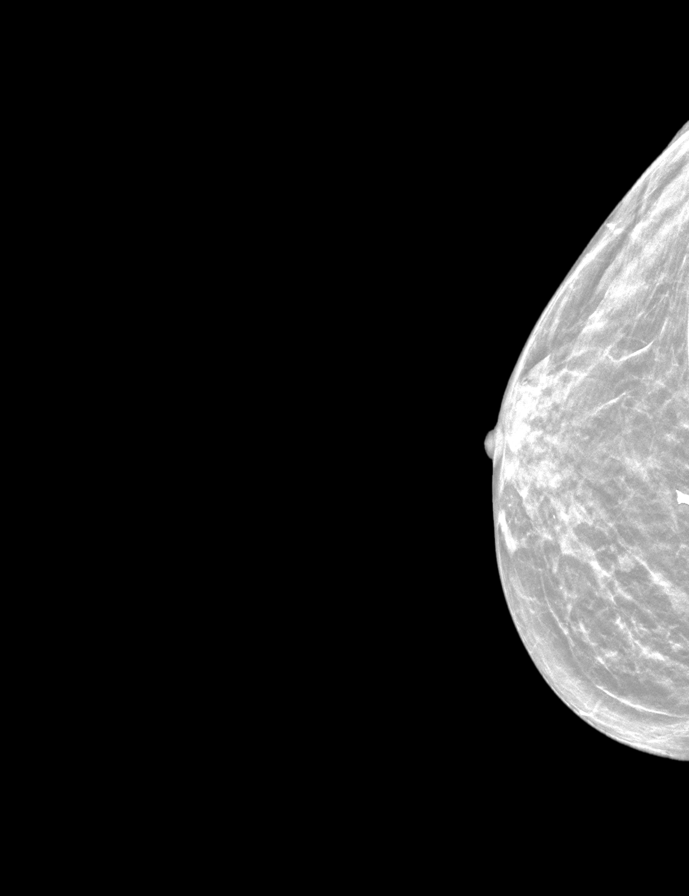

[L CC synth-2D]
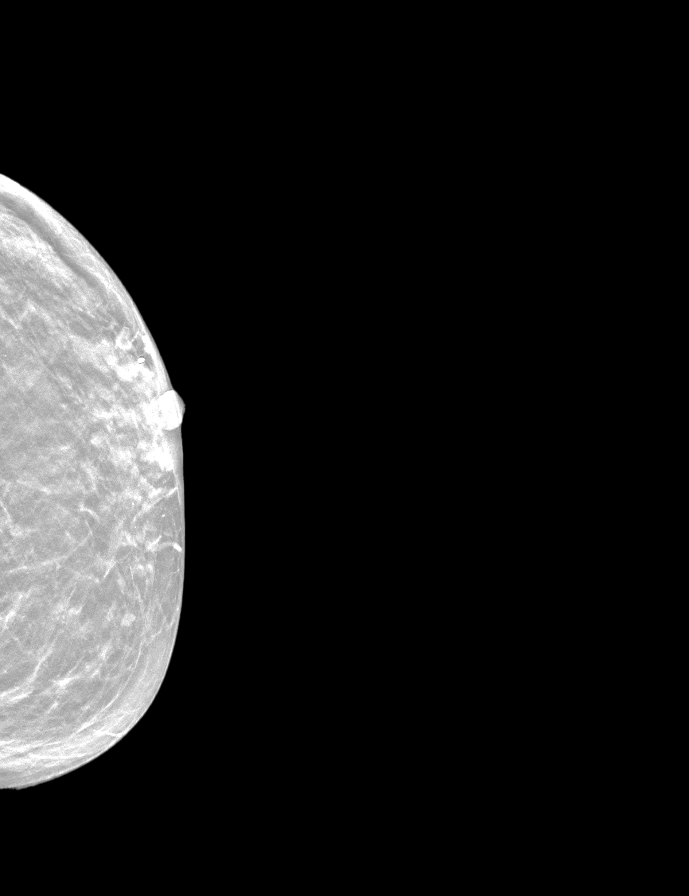

[R CC synth-2D (2 of 2)]
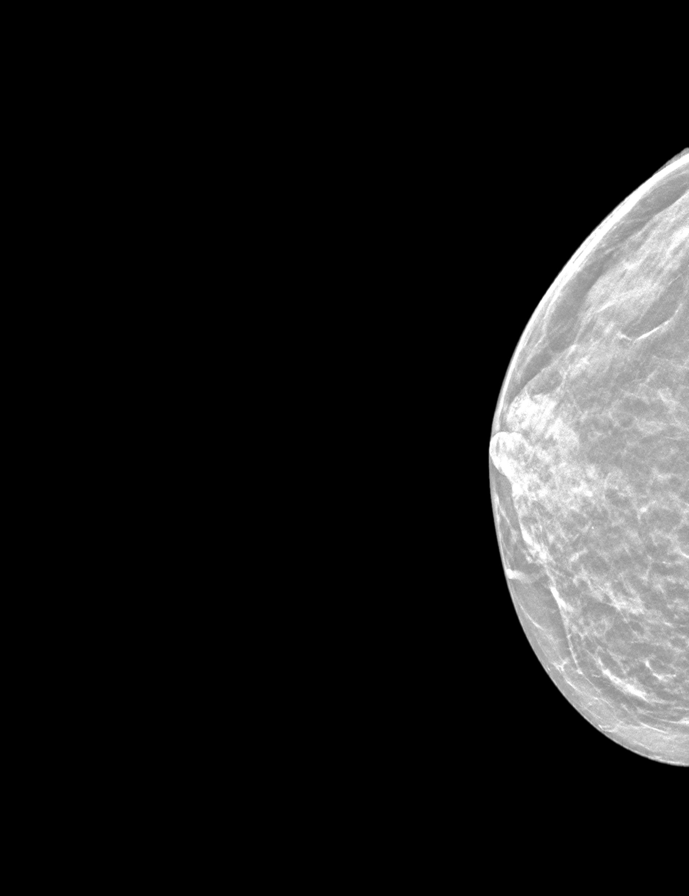

[L MLO synth-2D]
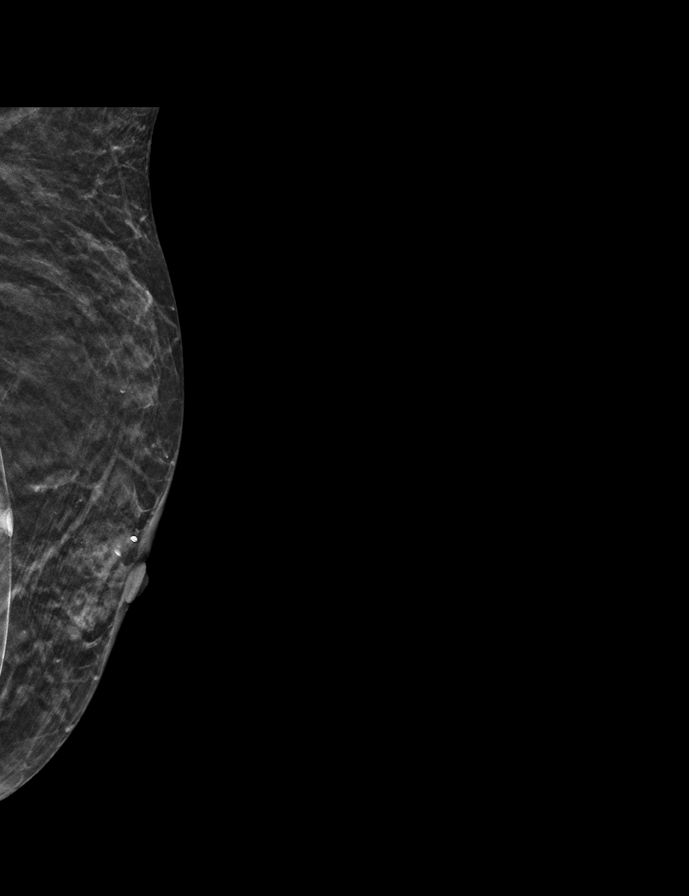

[8 of 34 positions shown; findings below may reference images not displayed]

ACR Breast Density Category c: The breast tissue is heterogeneously
dense, which may obscure small masses.
FINDINGS: Mammogram:

The patient has retropectoral implants.

Right breast: No suspicious mass, distortion, or microcalcifications
are identified to suggest presence of malignancy.

Left breast: The previously seen mass within the superior left
breast is not definitely visualized today.

Ultrasound:

Targeted ultrasound performed in the left breast at 2 o'clock 2 cm
from the nipple demonstrating adjacent oval circumscribed nearly
anechoic masses measuring 0.4 x 0.3 x 0.4 cm, previously 0.4 x 0.3 x
0.4 cm, and 0.3 x 0.3 x 0.5 cm, previously 0.4 x 0.3 x 0.5 cm.
IMPRESSION: 1.  Stable probably benign left breast masses at 2 o'clock.

2.  No mammographic evidence of malignancy in the right breast.

RECOMMENDATION:
Diagnostic bilateral mammogram and left breast ultrasound in 1 year.

I have discussed the findings and recommendations with the patient.
If applicable, a reminder letter will be sent to the patient
regarding the next appointment.

BI-RADS CATEGORY  3: Probably benign.

## 2021-12-14 ENCOUNTER — Telehealth: Payer: Self-pay

## 2021-12-14 ENCOUNTER — Ambulatory Visit (INDEPENDENT_AMBULATORY_CARE_PROVIDER_SITE_OTHER): Payer: Self-pay | Admitting: Family Medicine

## 2021-12-14 DIAGNOSIS — Z91199 Patient's noncompliance with other medical treatment and regimen due to unspecified reason: Secondary | ICD-10-CM

## 2021-12-14 DIAGNOSIS — F418 Other specified anxiety disorders: Secondary | ICD-10-CM

## 2021-12-14 NOTE — Telephone Encounter (Signed)
Patient was scheduled today 12/14/21 with Dr. Raoul Pitch.  This is her 4th no show with Dr. Raoul Pitch.  Dates are as followed: 12/14/21 10/23/20 07/15/19 02/08/19

## 2021-12-14 NOTE — Patient Instructions (Signed)
No follow-ups on file.        Great to see you today.  I have refilled the medication(s) we provide.   If labs were collected, we will inform you of lab results once received either by echart message or telephone call.   - echart message- for normal results that have been seen by the patient already.   - telephone call: abnormal results or if patient has not viewed results in their echart.  

## 2021-12-14 NOTE — Progress Notes (Signed)
SUBJECTIVE No chief complaint on file.  Patient Care Team    Relationship Specialty Notifications Start End  Ma Hillock, DO PCP - General Family Medicine  02/29/16   Rolm Bookbinder, MD Consulting Physician General Surgery  07/29/16      HPI: Dominique Taylor is a 46 y.o. female present for follow up on  depression and anxiety: Patient presents today for follow-up on depression anxiety and reports she feels ***. She reports compliance with  Effexor 225,  seroquel 50 mg QHS and ativan 0.5 TID PRN.  She reports she feels she is sleeping better now that she started Seroquel in place of Abilify.  ***  Asthma/tobacco use disorder: Patient reports her asthma/COPD is ***.  She reports compliance with Advair 2 puffs twice a day.   She is compliant with Zyrtec and using the albuterol only when needed.  She also reports since seen last she has cut back on the amount of cigarettes she is smoking daily***  Review of Systems  All other systems reviewed and are negative.       06/29/2021    2:36 PM 11/09/2020    3:27 PM 05/29/2020    2:50 PM 11/29/2019   10:37 AM 09/14/2018    3:18 PM  Depression screen PHQ 2/9  Decreased Interest 3 2 0 3 3  Down, Depressed, Hopeless 2 3 0 0 1  PHQ - 2 Score 5 5 0 3 4  Altered sleeping '3 3  3 1  '$ Tired, decreased energy '3 3  3 1  '$ Change in appetite '3 2  3 '$ 0  Feeling bad or failure about yourself  '2 3  1 1  '$ Trouble concentrating '2 2  3 1  '$ Moving slowly or fidgety/restless 1 0  0 0  Suicidal thoughts 1 0  1 0  PHQ-9 Score '20 18  17 8  '$ Difficult doing work/chores    Extremely dIfficult Somewhat difficult      06/29/2021    2:36 PM 11/09/2020    3:28 PM 11/29/2019   10:39 AM 09/14/2018    3:17 PM  GAD 7 : Generalized Anxiety Score  Nervous, Anxious, on Edge '3 3 3 1  '$ Control/stop worrying '3 3 3 3  '$ Worry too much - different things '3 3 3 3  '$ Trouble relaxing '3 2 3 1  '$ Restless 3 0 3 1  Easily annoyed or irritable '2 3 3 3  '$ Afraid - awful might  happen 2 0 1 1  Total GAD 7 Score '19 14 19 13  '$ Anxiety Difficulty   Extremely difficult Somewhat difficult    Patient Active Problem List   Diagnosis Date Noted   Mild persistent asthma 02/18/2019   Long-term current use of tamoxifen 01/09/2017   Night sweats 01/09/2017   Depression with anxiety 02/29/2016   Tobacco use disorder 07/24/2014    Social History   Tobacco Use   Smoking status: Every Day    Packs/day: 1.00    Years: 23.00    Total pack years: 23.00    Types: Cigarettes   Smokeless tobacco: Never  Substance Use Topics   Alcohol use: No    Current Outpatient Medications:    albuterol (VENTOLIN HFA) 108 (90 Base) MCG/ACT inhaler, Inhale 2 puffs into the lungs every 6 (six) hours as needed for wheezing or shortness of breath., Disp: 18 g, Rfl: 11   cetirizine (ZYRTEC) 10 MG tablet, Take 1 tablet (10 mg total) by mouth daily., Disp: 90 tablet,  Rfl: 3   fluticasone-salmeterol (ADVAIR HFA) 230-21 MCG/ACT inhaler, Inhale 2 puffs into the lungs 2 (two) times daily., Disp: 1 each, Rfl: 12   LORazepam (ATIVAN) 0.5 MG tablet, Take 1 tablet (0.5 mg total) by mouth 2 (two) times daily as needed for anxiety., Disp: 60 tablet, Rfl: 5   Prenatal Vit-Fe Fumarate-FA (PRENATAL MULTIVITAMIN) TABS tablet, Take 1 tablet by mouth daily at 12 noon., Disp: , Rfl:    QUEtiapine (SEROQUEL) 50 MG tablet, 1/2 tab with breakfast and 1 tab before bed., Disp: 135 tablet, Rfl: 1   tamoxifen (NOLVADEX) 20 MG tablet, TAKE 1 TABLET BY MOUTH ONCE DAILY AT BEDTIME, Disp: 30 tablet, Rfl: 11   venlafaxine XR (EFFEXOR-XR) 75 MG 24 hr capsule, Take 3 capsules (225 mg total) by mouth daily with breakfast., Disp: 270 capsule, Rfl: 1  Allergies  Allergen Reactions   Wellbutrin [Bupropion] Itching    OBJECTIVE: LMP 05/22/2017 (Exact Date)  Physical Exam Vitals and nursing note reviewed.  Constitutional:      General: She is not in acute distress.    Appearance: Normal appearance. She is not ill-appearing  or toxic-appearing.  HENT:     Head: Normocephalic and atraumatic.  Eyes:     General: No scleral icterus.       Right eye: No discharge.        Left eye: No discharge.     Extraocular Movements: Extraocular movements intact.     Conjunctiva/sclera: Conjunctivae normal.     Pupils: Pupils are equal, round, and reactive to light.  Cardiovascular:     Rate and Rhythm: Normal rate and regular rhythm.     Heart sounds: No murmur heard. Pulmonary:     Effort: Pulmonary effort is normal. No respiratory distress.     Breath sounds: Normal breath sounds. No wheezing, rhonchi or rales.  Musculoskeletal:     Right lower leg: No edema.     Left lower leg: No edema.  Skin:    General: Skin is warm and dry.     Coloration: Skin is not jaundiced or pale.     Findings: No erythema or rash.  Neurological:     Mental Status: She is alert and oriented to person, place, and time. Mental status is at baseline.     Motor: No weakness.     Gait: Gait normal.  Psychiatric:        Mood and Affect: Mood normal.        Behavior: Behavior normal.        Thought Content: Thought content normal.        Judgment: Judgment normal.     ASSESSMENT AND PLAN: Dominique Taylor is a 46 y.o. female present for  Asthma/daily smoker *** Continue Advair 2 puffs BID Continue Flonase and Zyrtec daily Continue to cut back on cigarette smoking.  - albuterol only if needed- Goal < 2 x weekly.   Depression with anxiety *** Continue Effexor to 225 mg daily Continue Seroquel 50 mg qhs AND added 25 mg with breakfast Continue Ativan twice daily only if needed -  [facility] reviewed and appropriate 12/14/21 - f/u 5.5 mos- must be face to face encounter, sooner if needed  Tamoxifen long-term use: -tolerating.    No orders of the defined types were placed in this encounter.   No orders of the defined types were placed in this encounter.   Referral Orders  No referral(s) requested today     Howard Pouch, DO 12/14/2021

## 2021-12-14 NOTE — Telephone Encounter (Signed)
noted 

## 2021-12-15 ENCOUNTER — Encounter: Payer: Self-pay | Admitting: Family Medicine

## 2021-12-29 ENCOUNTER — Other Ambulatory Visit: Payer: Self-pay | Admitting: Family Medicine

## 2021-12-29 DIAGNOSIS — F418 Other specified anxiety disorders: Secondary | ICD-10-CM

## 2022-01-05 ENCOUNTER — Other Ambulatory Visit: Payer: Self-pay | Admitting: Family Medicine

## 2022-02-11 ENCOUNTER — Other Ambulatory Visit: Payer: Self-pay | Admitting: Family Medicine

## 2022-02-11 DIAGNOSIS — F418 Other specified anxiety disorders: Secondary | ICD-10-CM
